# Patient Record
Sex: Female | Born: 1955 | Race: White | Hispanic: No | Smoking: Current every day smoker
Health system: Southern US, Community
[De-identification: ages and names within clinical notes are randomized; demographics above are authoritative.]

## PROBLEM LIST (undated history)

## (undated) DIAGNOSIS — K279 Peptic ulcer, site unspecified, unspecified as acute or chronic, without hemorrhage or perforation: Secondary | ICD-10-CM

## (undated) DIAGNOSIS — R21 Rash and other nonspecific skin eruption: Secondary | ICD-10-CM

## (undated) DIAGNOSIS — Z9889 Other specified postprocedural states: Secondary | ICD-10-CM

## (undated) DIAGNOSIS — M549 Dorsalgia, unspecified: Secondary | ICD-10-CM

## (undated) DIAGNOSIS — E039 Hypothyroidism, unspecified: Secondary | ICD-10-CM

## (undated) DIAGNOSIS — I2510 Atherosclerotic heart disease of native coronary artery without angina pectoris: Secondary | ICD-10-CM

## (undated) DIAGNOSIS — I5032 Chronic diastolic (congestive) heart failure: Secondary | ICD-10-CM

## (undated) DIAGNOSIS — Z720 Tobacco use: Secondary | ICD-10-CM

## (undated) DIAGNOSIS — R112 Nausea with vomiting, unspecified: Secondary | ICD-10-CM

## (undated) DIAGNOSIS — H9190 Unspecified hearing loss, unspecified ear: Secondary | ICD-10-CM

## (undated) DIAGNOSIS — E785 Hyperlipidemia, unspecified: Secondary | ICD-10-CM

## (undated) DIAGNOSIS — M1990 Unspecified osteoarthritis, unspecified site: Secondary | ICD-10-CM

## (undated) DIAGNOSIS — G8929 Other chronic pain: Secondary | ICD-10-CM

## (undated) DIAGNOSIS — F32A Depression, unspecified: Secondary | ICD-10-CM

## (undated) DIAGNOSIS — I350 Nonrheumatic aortic (valve) stenosis: Secondary | ICD-10-CM

## (undated) DIAGNOSIS — K2270 Barrett's esophagus without dysplasia: Secondary | ICD-10-CM

## (undated) DIAGNOSIS — R569 Unspecified convulsions: Secondary | ICD-10-CM

## (undated) DIAGNOSIS — K219 Gastro-esophageal reflux disease without esophagitis: Secondary | ICD-10-CM

## (undated) DIAGNOSIS — I119 Hypertensive heart disease without heart failure: Secondary | ICD-10-CM

## (undated) DIAGNOSIS — F329 Major depressive disorder, single episode, unspecified: Secondary | ICD-10-CM

## (undated) HISTORY — DX: Hypertensive heart disease without heart failure: I11.9

## (undated) HISTORY — DX: Tobacco use: Z72.0

## (undated) HISTORY — PX: TONSILLECTOMY AND ADENOIDECTOMY: SUR1326

## (undated) HISTORY — PX: CHOLECYSTECTOMY: SHX55

## (undated) HISTORY — DX: Gastro-esophageal reflux disease without esophagitis: K21.9

## (undated) HISTORY — DX: Unspecified convulsions: R56.9

## (undated) HISTORY — DX: Chronic diastolic (congestive) heart failure: I50.32

## (undated) HISTORY — DX: Nonrheumatic aortic (valve) stenosis: I35.0

## (undated) HISTORY — PX: CARDIAC CATHETERIZATION: SHX172

## (undated) HISTORY — DX: Depression, unspecified: F32.A

## (undated) HISTORY — DX: Major depressive disorder, single episode, unspecified: F32.9

## (undated) HISTORY — DX: Other chronic pain: G89.29

## (undated) HISTORY — DX: Peptic ulcer, site unspecified, unspecified as acute or chronic, without hemorrhage or perforation: K27.9

## (undated) HISTORY — DX: Atherosclerotic heart disease of native coronary artery without angina pectoris: I25.10

## (undated) HISTORY — DX: Unspecified hearing loss, unspecified ear: H91.90

## (undated) HISTORY — DX: Rash and other nonspecific skin eruption: R21

## (undated) HISTORY — DX: Barrett's esophagus without dysplasia: K22.70

## (undated) HISTORY — DX: Dorsalgia, unspecified: M54.9

## (undated) HISTORY — DX: Hyperlipidemia, unspecified: E78.5

## (undated) HISTORY — PX: VAGINAL HYSTERECTOMY: SUR661

---

## 2005-10-08 ENCOUNTER — Other Ambulatory Visit: Payer: Self-pay

## 2005-10-08 ENCOUNTER — Inpatient Hospital Stay: Payer: Self-pay | Admitting: Internal Medicine

## 2005-11-23 ENCOUNTER — Ambulatory Visit: Payer: Self-pay | Admitting: Internal Medicine

## 2006-03-28 ENCOUNTER — Emergency Department: Payer: Self-pay | Admitting: Emergency Medicine

## 2009-11-11 ENCOUNTER — Ambulatory Visit: Payer: Self-pay | Admitting: Otolaryngology

## 2009-11-30 ENCOUNTER — Ambulatory Visit: Payer: Self-pay | Admitting: Pain Medicine

## 2010-01-11 ENCOUNTER — Ambulatory Visit: Payer: Self-pay | Admitting: Pain Medicine

## 2010-02-15 ENCOUNTER — Ambulatory Visit: Payer: Self-pay | Admitting: Pain Medicine

## 2010-06-08 ENCOUNTER — Emergency Department (HOSPITAL_COMMUNITY): Admission: EM | Admit: 2010-06-08 | Discharge: 2010-06-09 | Payer: Self-pay | Admitting: Emergency Medicine

## 2010-07-14 ENCOUNTER — Ambulatory Visit: Payer: Self-pay | Admitting: Thoracic Surgery (Cardiothoracic Vascular Surgery)

## 2010-07-22 ENCOUNTER — Encounter: Payer: Self-pay | Admitting: Thoracic Surgery (Cardiothoracic Vascular Surgery)

## 2010-07-22 ENCOUNTER — Other Ambulatory Visit: Payer: Self-pay | Admitting: Thoracic Surgery (Cardiothoracic Vascular Surgery)

## 2010-07-22 ENCOUNTER — Ambulatory Visit: Payer: Self-pay | Admitting: Vascular Surgery

## 2010-07-26 ENCOUNTER — Ambulatory Visit (HOSPITAL_COMMUNITY)
Admission: RE | Admit: 2010-07-26 | Discharge: 2010-07-26 | Payer: Self-pay | Admitting: Thoracic Surgery (Cardiothoracic Vascular Surgery)

## 2010-12-15 LAB — COMPREHENSIVE METABOLIC PANEL
ALT: 17 U/L (ref 0–35)
AST: 26 U/L (ref 0–37)
Albumin: 4.2 g/dL (ref 3.5–5.2)
BUN: 14 mg/dL (ref 6–23)
CO2: 24 mEq/L (ref 19–32)
Calcium: 9.8 mg/dL (ref 8.4–10.5)
Chloride: 92 mEq/L — ABNORMAL LOW (ref 96–112)
Creatinine, Ser: 1.04 mg/dL (ref 0.4–1.2)
GFR calc Af Amer: >60 mL/min (ref >60)
GFR calc non Af Amer: 55 mL/min — ABNORMAL LOW (ref >60)
Glucose, Bld: 91 mg/dL (ref 70–99)
Potassium: 4.3 mEq/L (ref 3.5–5.1)
Sodium: 126 mEq/L — ABNORMAL LOW (ref 135–145)
Total Bilirubin: 0.4 mg/dL (ref 0.3–1.2)

## 2010-12-15 LAB — CBC
HCT: 46.4 % — ABNORMAL HIGH (ref 36.0–46.0)
Hemoglobin: 17.1 g/dL — ABNORMAL HIGH (ref 12.0–15.0)
MCH: 32.3 pg (ref 26.0–34.0)
MCHC: 36.9 g/dL — ABNORMAL HIGH (ref 30.0–36.0)
MCV: 87.5 fL (ref 78.0–100.0)
Platelets: 334 10*3/uL (ref 150–400)
RBC: 5.3 MIL/uL — ABNORMAL HIGH (ref 3.87–5.11)
WBC: 9.8 10*3/uL (ref 4.0–10.5)

## 2010-12-15 LAB — TYPE AND SCREEN
ABO/RH(D): O POS
Antibody Screen: NEGATIVE

## 2010-12-15 LAB — HEMOGLOBIN A1C
Hgb A1c MFr Bld: 5.2 % (ref <5.7)
Mean Plasma Glucose: 103 mg/dL (ref <117)

## 2010-12-15 LAB — PROTIME-INR
INR: 0.95 (ref 0.00–1.49)
Prothrombin Time: 12.9 seconds (ref 11.6–15.2)

## 2010-12-15 LAB — BASIC METABOLIC PANEL
BUN: 15 mg/dL (ref 6–23)
CO2: 23 mEq/L (ref 19–32)
Calcium: 9.1 mg/dL (ref 8.4–10.5)
Chloride: 97 mEq/L (ref 96–112)
Creatinine, Ser: 0.95 mg/dL (ref 0.4–1.2)
GFR calc Af Amer: >60 mL/min (ref >60)
GFR calc non Af Amer: >60 mL/min (ref >60)
Glucose, Bld: 101 mg/dL — ABNORMAL HIGH (ref 70–99)
Sodium: 127 mEq/L — ABNORMAL LOW (ref 135–145)

## 2010-12-15 LAB — SURGICAL PCR SCREEN
MRSA, PCR: NEGATIVE
Staphylococcus aureus: NEGATIVE

## 2010-12-15 LAB — APTT: aPTT: 30 seconds (ref 24–37)

## 2010-12-15 LAB — ABO/RH: ABO/RH(D): O POS

## 2010-12-16 LAB — CBC
HCT: 40.3 % (ref 36.0–46.0)
Hemoglobin: 14.7 g/dL (ref 12.0–15.0)
MCH: 31.7 pg (ref 26.0–34.0)
MCHC: 36.5 g/dL — ABNORMAL HIGH (ref 30.0–36.0)
MCV: 87 fL (ref 78.0–100.0)
Platelets: 255 10*3/uL (ref 150–400)
RBC: 4.63 MIL/uL (ref 3.87–5.11)
RDW: 13.1 % (ref 11.5–15.5)
WBC: 8.7 10*3/uL (ref 4.0–10.5)

## 2010-12-16 LAB — DIFFERENTIAL
Basophils Absolute: 0 10*3/uL (ref 0.0–0.1)
Basophils Relative: 0 % (ref 0–1)
Eosinophils Absolute: 0.1 10*3/uL (ref 0.0–0.7)
Eosinophils Relative: 1 % (ref 0–5)
Lymphocytes Relative: 34 % (ref 12–46)
Lymphs Abs: 2.9 10*3/uL (ref 0.7–4.0)
Monocytes Absolute: 0.6 10*3/uL (ref 0.1–1.0)
Neutro Abs: 5.1 10*3/uL (ref 1.7–7.7)
Neutrophils Relative %: 59 % (ref 43–77)

## 2010-12-16 LAB — URINE MICROSCOPIC-ADD ON

## 2010-12-16 LAB — URINALYSIS, ROUTINE W REFLEX MICROSCOPIC
Bilirubin Urine: NEGATIVE
Glucose, UA: NEGATIVE mg/dL
Ketones, ur: NEGATIVE mg/dL
Leukocytes, UA: NEGATIVE
Nitrite: NEGATIVE
Protein, ur: NEGATIVE mg/dL
Urobilinogen, UA: 0.2 mg/dL (ref 0.0–1.0)

## 2010-12-16 LAB — BASIC METABOLIC PANEL
BUN: 17 mg/dL (ref 6–23)
CO2: 23 mEq/L (ref 19–32)
Calcium: 8.5 mg/dL (ref 8.4–10.5)
Chloride: 92 mEq/L — ABNORMAL LOW (ref 96–112)
Creatinine, Ser: 1.03 mg/dL (ref 0.4–1.2)
GFR calc Af Amer: >60 mL/min (ref >60)
GFR calc non Af Amer: 56 mL/min — ABNORMAL LOW (ref >60)
Potassium: 3.4 mEq/L — ABNORMAL LOW (ref 3.5–5.1)
Sodium: 124 mEq/L — ABNORMAL LOW (ref 135–145)

## 2010-12-16 LAB — POCT CARDIAC MARKERS
CKMB, poc: 1.3 ng/mL (ref 1.0–8.0)
Myoglobin, poc: 78.9 ng/mL (ref 12–200)

## 2010-12-16 LAB — URINE CULTURE
Colony Count: NO GROWTH
Culture  Setup Time: 201109070016
Culture: NO GROWTH

## 2010-12-16 LAB — T4, FREE: Free T4: 1.33 ng/dL (ref 0.80–1.80)

## 2010-12-16 LAB — BRAIN NATRIURETIC PEPTIDE: Pro B Natriuretic peptide (BNP): 151 pg/mL — ABNORMAL HIGH (ref 0.0–100.0)

## 2010-12-16 LAB — TSH: TSH: 0.276 u[IU]/mL — ABNORMAL LOW (ref 0.350–4.500)

## 2010-12-16 LAB — D-DIMER, QUANTITATIVE: D-Dimer, Quant: <0.22 ug/mL-FEU (ref 0.00–0.48)

## 2011-02-15 NOTE — Consult Note (Signed)
NEW PATIENT CONSULTATION   [name], [name]  DOB:  01-04-1956                                        July 14, 2010  CHART #:  [id number]   HISTORY OF PRESENT ILLNESS:  The patient is a 55 year old woman with  known aortic stenosis who complains of feeling chest discomfort and  shortness of breath with exertion and also frequent feeling of a rapid  strong heartbeat over the past 8 or 9 months.  She states that she has  had a heart murmur her whole life.  About 5 years ago, she was told she  had aortic stenosis because her heart valve had not developed normally.  She states that over the past 6 months or so, she has been tiring very  easily.  She also occasionally will get shortness of breath.  She has  had some mild exertional chest tightness, but not what she would  describe as pain.  She also frequently gets episodes where she feels  like her heart is going to be out of her chest.  She feels very anxious,  her heart seems to be very powerfully and very uncomfortably and also at  times is very rapid.  She had had a followup echo back in May of this  year, which showed progression of her aortic stenosis with a peak  velocity of 3.9 m/sec consistent with a peak gradient of 57 mmHg and  estimated mean gradient of 36 mmHg.  At that time, she was seen by Dr.  Marrian Salvage at [facility] and referred to Dr. Remo Lipps.  Dr. Remo Lipps  evaluated her and recommended aortic valve replacement.  She was very  nervous and quite anxious at that time and would not commit to having  surgery.  She subsequently followed up with Dr. Lois Huxley and Dr. Joneen Roach, both of those appointments were in July of this year and both the  doctors emphasized the importance of aortic valve replacement.  She did  have a cardiac catheterization done that was back in April of this year,  which did show no hemodynamically significant coronary disease.  There  was some mild plaquing.  She states that  her symptoms have been fairly  stable since that time.  She has not had any presyncopal or syncopal  spells or any severe chest pain; no symptoms at rest, only with  exertion.  She does continue to have these frequent palpitations and now  feels as if she is ready to go ahead with aortic valve replacement.   PAST MEDICAL HISTORY:  Significant for aortic stenosis, mild non-  hemodynamically significant coronary artery disease, congestive heart  failure, dyslipidemia, hypothyroidism, Barrett esophagus,  gastroesophageal reflux, peptic ulcer disease, depression, and seizures  at age 31.   CURRENT MEDICATIONS:  1. Enteric-coated aspirin 81 mg daily.  2. Hydrochlorothiazide 12.5 mg daily.  3. Levothyroxine 75 mcg daily.  4. Simvastatin 80 mg daily.  5. Hydrocodone 10/500 one tablet 4 times daily as needed.   ALLERGIES:  She has no known drug allergies.   FAMILY HISTORY:  Significant for father having an MI.   SOCIAL HISTORY:  She is disabled due to her nerves and chronic back pain  and migraine headaches.  She does live at home.  She smokes one pack a  day and has for over 30  years and continues to smoke at this time.   REVIEW OF SYSTEMS:  Denies syncope, presyncope, paroxysmal nocturnal  dyspnea, orthopnea, peripheral edema, claudication, excessive bleeding,  or bruising.  She does have chronic back pain, occasional severe  headaches.  No fevers, chills, or sweats.  All other systems are  negative.   PHYSICAL EXAMINATION:  General:  The patient is a 55 year old woman, in  no acute distress.  Vital Signs:  Her blood pressure is 129/91, pulse  90, respirations are 18, and her oxygen saturation is 94% on room air.  Neurological:  She is alert and oriented, although very anxious.  There  is no focal deficits.  HEENT:  She is edentulous, otherwise  unremarkable.  Neck:  Bilateral transmitted murmurs.  Cardiac:  A 4/6  crescendo-decrescendo systolic murmur heard throughout the  precordium,  loudest at the right upper sternal border.  Lungs:  Clear with equal  breath sounds bilaterally.  Abdomen:  Soft and nontender.  Extremities:  Without clubbing, cyanosis, or edema.  She has faintly palpable radial,  dorsalis pedis, and posterior tibial pulses bilaterally.   LABORATORY DATA:  Echocardiogram from December 21, 2009, shows peak LV  outflow gradient of 3.9 mm/sec with an estimated mean gradient of 36  mmHg and estimated peak gradient of 57 mmHg.  The left ventricular  function was preserved.  There was left ventricular hypertrophy.  Ejection fraction was 55-60%.  There was some mitral annular  calcification, but no pathologic mitral regurg.  The remainder of the  study was unremarkable.  Cardiac catheterization which was performed on  Feb 01, 2010, showed 20-30% stenoses in the LAD.  The circumflex had  multiple plaques of less than 50% stenosis.  The right coronary was  small and nondominant.  There was no left main disease.  Her valve area  calculated 0.46 sq. cm with mean gradient of 63 mmHg.  Cardiac output  was 3.9 by Fick and 3.28 by thermodilution.   IMPRESSION:  The patient is a 55 year old woman with critical aortic  stenosis, which is symptomatic. I discussed the importance of aortic  valve replacement and discussed the natural history of critical aortic  stenosis once it becomes symptomatic.  I am in complete agreement with  Dr. Remo Lipps that aortic valve replacement as indicated.  We discussed  possible options including a sternotomy versus a more minimally invasive  left pericostal approach.  We also discussed valve options, which  include mechanical or tissue valves and discussed the relative  advantages and disadvantages of each particularly in regards to need for  lifelong anticoagulation.  I discussed in detail with her the expected  hospital stay and overall recovery.  I did discuss in detail the risks  which include, but not limited to death,  stroke, myocardial infarction,  deep venous thrombosis, pulmonary embolism, bleeding, possible need for  transfusions, infections as well as other organ system dysfunction  including respiratory, renal, or gastrointestinal complications.  She  understands and accepts these risks and does wish to proceed with  surgery at this time.  We have scheduled her for Monday, July 26, 2010.  She would be admitted the day of surgery.  She would need to come  in the week before for preoperative assessment by Anesthesia.  At the  present time, she is leaning towards a conventional operation and also  leaning towards a tissue valve to avoid the need for lifelong  anticoagulation.  She does understand that if this approach is used,  there is risk for intermediate or long-term valve failure and need for  re-replacement in the future.  She will continue to think over these  issues and will let us know if she has any change in her feelings  regarding those 2 issues.   Salvatore Decent Dorris Fetch, M.D.  Electronically Signed   SCH/MEDQ  D:  07/14/2010  T:  07/15/2010  Job:  [id number]   cc:   Dr. Lois Huxley  Dr. Joneen Roach  Dr. Pat Patrick

## 2011-02-15 NOTE — Assessment & Plan Note (Signed)
OFFICE VISIT   [name], [name]  DOB:  May 21, 1956                                        August 16, 2010  CHART #:  [id number]   The patient was scheduled for an aortic valve replacement tomorrow.  She  called in this morning and canceled that operation again.  I spoke to  her by telephone.  She says that now for the last 3-4 weeks she has been  having persistent nausea, vomiting, diarrhea, congestion, ear aches,  cough, and multiple other complaints.  Of note, she is not complaining  of chest pain and does not feel that she is in shape to have heart  surgery at this time.  I had a long discussion with her, once again  emphasized the very real importance of getting this aortic valve  replaced as soon as possible given the critical nature of aortic  stenosis.  I do think there is probably some anxiety component but I  certainly would like to see her nausea, vomiting, and diarrhea resolved  prior to having the surgery.  She has seen her doctor and was started on  some antibiotics about a week ago.  I advised her to keep on a very  regular basis so that we can get this resolved as soon as possible and  get her aortic valve replaced as soon as possible again because she does  have life-threatening aortic stenosis.   Salvatore Decent Dorris Fetch, M.D.  Electronically Signed   SCH/MEDQ  D:  08/16/2010  T:  08/17/2010  Job:  [id number]

## 2011-02-15 NOTE — Letter (Signed)
July 14, 2010   Carmin Richmond, MD  [facility]  [address], [place] [address]  Fax number [phone].   Re:  [name], [name]               DOB:  1956/06/15   Dear Dr. Earlene Plater:   I had the pleasure of seeing your patient in the office today.  As you  know, she is a 55 year old woman with critical aortic stenosis which is  symptomatic.  She had been evaluated in the spring at [facility] and  advised to have aortic valve replacement but was not ready to proceed  with surgery at that time for a variety of reasons.  I once again  emphasized to her the need for aortic valve replacement given the  severity of her aortic stenosis and her symptoms, and at this time she  does seem willing to proceed with surgery.  I did discuss with her  potential approaches including a conventional sternotomy versus a more  minimally invasive approach and she is undecided in regard to that  issue.  She also is undecided in terms of valve whether she would want  to have a mechanical valve to avoid the need for re-replacement, but  with the caveat of needing lifelong anticoagulation versus tissue valve  which would avoid the need for lifelong Coumadin but could result in  valve failure and need for re-replacement down the road.  We will  schedule her for surgery on Monday, July 26, 2010.  She will be  admitted on the day of surgery and we will touch base with her in the  meantime to clear up the final plans.  Again, thank you very much for  allowing [place] to see the patient and do not hesitate to contact me if you  have any questions regarding her care.   Salvatore Decent Dorris Fetch, M.D.  Electronically Signed   SCH/MEDQ  D:  07/14/2010  T:  07/15/2010  Job:  [id number]

## 2011-10-31 ENCOUNTER — Inpatient Hospital Stay: Payer: Self-pay | Admitting: Internal Medicine

## 2011-10-31 LAB — URINALYSIS, COMPLETE
Bilirubin,UR: NEGATIVE
Glucose,UR: NEGATIVE mg/dL (ref 0–75)
Hyaline Cast: 2
Ketone: NEGATIVE
Specific Gravity: 1.032 (ref 1.003–1.030)
Squamous Epithelial: 9
WBC UR: 15 /HPF (ref 0–5)

## 2011-10-31 LAB — COMPREHENSIVE METABOLIC PANEL
Alkaline Phosphatase: 92 U/L (ref 50–136)
Anion Gap: 10 (ref 7–16)
Calcium, Total: 8.5 mg/dL (ref 8.5–10.1)
Co2: 19 mmol/L — ABNORMAL LOW (ref 21–32)
EGFR (African American): >60
EGFR (Non-African Amer.): 56 — ABNORMAL LOW
Osmolality: 272 (ref 275–301)
Potassium: 3.8 mmol/L (ref 3.5–5.1)
SGPT (ALT): 12 U/L
Sodium: 136 mmol/L (ref 136–145)

## 2011-10-31 LAB — CBC WITH DIFFERENTIAL/PLATELET
Basophil %: 0.7 %
Eosinophil %: 0.4 %
HCT: 42 % (ref 35.0–47.0)
HGB: 14.2 g/dL (ref 12.0–16.0)
Lymphocyte #: 4.3 10*3/uL — ABNORMAL HIGH (ref 1.0–3.6)
Lymphocyte %: 48.5 %
Monocyte #: 0.5 10*3/uL (ref 0.0–0.7)
Monocyte %: 6.1 %
Neutrophil %: 44.3 %
RBC: 4.41 10*6/uL (ref 3.80–5.20)

## 2011-10-31 LAB — CK TOTAL AND CKMB (NOT AT ARMC)
CK, Total: 102 U/L (ref 21–215)
CK-MB: 4.7 ng/mL — ABNORMAL HIGH (ref 0.5–3.6)

## 2011-11-01 DIAGNOSIS — R079 Chest pain, unspecified: Secondary | ICD-10-CM

## 2011-11-01 DIAGNOSIS — I359 Nonrheumatic aortic valve disorder, unspecified: Secondary | ICD-10-CM

## 2011-11-01 DIAGNOSIS — R748 Abnormal levels of other serum enzymes: Secondary | ICD-10-CM

## 2011-11-01 LAB — TROPONIN I
Troponin-I: 0.07 ng/mL — ABNORMAL HIGH
Troponin-I: 0.06 ng/mL — ABNORMAL HIGH

## 2011-11-01 LAB — CK TOTAL AND CKMB (NOT AT ARMC)
CK, Total: 71 U/L (ref 21–215)
CK, Total: 68 U/L (ref 21–215)
CK-MB: 3.3 ng/mL (ref 0.5–3.6)

## 2011-11-01 LAB — LIPID PANEL
Cholesterol: 195 mg/dL (ref 0–200)
HDL Cholesterol: 30 mg/dL — ABNORMAL LOW (ref 40–60)
VLDL Cholesterol, Calc: 47 mg/dL — ABNORMAL HIGH (ref 5–40)

## 2011-11-18 ENCOUNTER — Encounter: Payer: Self-pay | Admitting: *Deleted

## 2011-11-18 ENCOUNTER — Ambulatory Visit (INDEPENDENT_AMBULATORY_CARE_PROVIDER_SITE_OTHER): Payer: Medicaid Other | Admitting: Cardiovascular Disease

## 2011-11-18 ENCOUNTER — Encounter: Payer: Self-pay | Admitting: Cardiovascular Disease

## 2011-11-18 DIAGNOSIS — R079 Chest pain, unspecified: Secondary | ICD-10-CM

## 2011-11-18 DIAGNOSIS — G8929 Other chronic pain: Secondary | ICD-10-CM | POA: Insufficient documentation

## 2011-11-18 DIAGNOSIS — M549 Dorsalgia, unspecified: Secondary | ICD-10-CM

## 2011-11-18 DIAGNOSIS — I350 Nonrheumatic aortic (valve) stenosis: Secondary | ICD-10-CM | POA: Insufficient documentation

## 2011-11-18 DIAGNOSIS — R0602 Shortness of breath: Secondary | ICD-10-CM | POA: Insufficient documentation

## 2011-11-18 DIAGNOSIS — I359 Nonrheumatic aortic valve disorder, unspecified: Secondary | ICD-10-CM

## 2011-11-18 NOTE — Assessment & Plan Note (Signed)
Mild shortness of breath at baseline, likely secondary to underlying aortic valve stenosis. Heart rate is relatively well controlled.

## 2011-11-18 NOTE — Progress Notes (Signed)
Patient ID: [name], female    DOB: 01/28/1956, 56 y.o.   MRN: [id number]  HPI Comments: [name] is a 56 year old woman with a history of severe aortic valve stenosis, long smoking history, mild coronary artery disease by cardiac catheterization at [facility] several years ago, chronic back pain who is on pain medications, somewhat deaf who presented to [facility] at the end of January 2013 with worsening chest pain.   Notes indicate she had a recent course of antibiotics for sinus congestion, she was scheduled to have tubes put in her ears by ENT and [place] but this did not happen secondary to her severe aortic stenosis and their risks and complications. She continues to take care of her mother with dementia and a disabled son. This was the reason she did not follow back up with Dr. Orson Aloe for surgery in 2011.  Echocardiogram was done in the hospital that showed estimated aortic valve area 0.7 cm, mean gradient 56 mmHg, peak gradient 97 mmHg, Peak velocity 487 cm/sec, mild LVH, right ventricular systolic pressure 30-40 mm of mercury, mildly dilated left atrium, mild MR. Left ventricle is nondilated.  In 2011, AV mean gradient was 36, peak gradient 57  Today she reports that she is doing well with no further chest pain. Mild shortness of breath at baseline.  Cardiac catheterization April 2000 and showing no hemodynamically significant disease, mild plaque She continues to have problems finding coverage for her mother who has dementia. 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 The patient reports that she has difficulty sleeping and has been taking numerous sleep pain pills  EKG from the hospital shows normal sinus rhythm with rate 76 beats per minute with ST abnormality in V5, V6, one and aVL, 2   Outpatient Encounter Prescriptions as of 11/18/2011  Medication Sig Dispense Refill  . albuterol-ipratropium (COMBIVENT) 18-103 MCG/ACT inhaler Inhale 2 puffs into the lungs every 6  (six) hours as needed.      Marland Kitchen aspirin 81 MG tablet Take 81 mg by mouth daily.      Marland Kitchen levothyroxine (SYNTHROID, LEVOTHROID) 50 MCG tablet Take 50 mcg by mouth daily.      Marland Kitchen omeprazole (PRILOSEC) 20 MG capsule Take 20 mg by mouth daily.      Marland Kitchen tiotropium (SPIRIVA) 18 MCG inhalation capsule Place 18 mcg into inhaler and inhale daily.      Marland Kitchen DISCONTD: ciprofloxacin (CIPRO) 500 MG tablet Take 500 mg by mouth 2 (two) times daily.      Marland Kitchen DISCONTD: oxycodone-acetaminophen (NARVOX) 10-500 MG per tablet Take 1 tablet by mouth every 6 (six) hours as needed.         Review of Systems  Constitutional: Negative.   HENT: Negative.   Eyes: Negative.   Respiratory: Positive for shortness of breath.   Cardiovascular: Positive for chest pain.  Gastrointestinal: Negative.   Musculoskeletal: Negative.   Skin: Negative.   Neurological: Negative.   Hematological: Negative.   Psychiatric/Behavioral: Negative.   All other systems reviewed and are negative.    BP 165/95  Pulse 112  Ht 5\' 1"  (1.549 m)  Wt 133 lb (60.328 kg)  BMI 25.13 kg/m2  Physical Exam  Nursing note and vitals reviewed. Constitutional: She is oriented to person, place, and time. She appears well-developed and well-nourished.  HENT:  Head: Normocephalic.  Nose: Nose normal.  Mouth/Throat: Oropharynx is clear and moist.  Eyes: Conjunctivae are normal. Pupils are equal, round, and reactive to light.  Neck: Normal  range of motion. Neck supple. No JVD present.  Cardiovascular: Normal rate, regular rhythm, S1 normal, S2 normal and intact distal pulses.  Exam reveals no gallop and no friction rub.   Murmur heard.  Crescendo systolic murmur is present with a grade of 3/6  Pulmonary/Chest: Effort normal and breath sounds normal. No respiratory distress. She has no wheezes. She has no rales. She exhibits no tenderness.  Abdominal: Soft. Bowel sounds are normal. She exhibits no distension. There is no tenderness.  Musculoskeletal: Normal  range of motion. She exhibits no edema and no tenderness.  Lymphadenopathy:    She has no cervical adenopathy.  Neurological: She is alert and oriented to person, place, and time. Coordination normal.  Skin: Skin is warm and dry. No rash noted. No erythema.  Psychiatric: She has a normal mood and affect. Her behavior is normal. Judgment and thought content normal.         Assessment and Plan

## 2011-11-18 NOTE — Assessment & Plan Note (Signed)
Recent episode of chest pain felt to be not secondary to underlying coronary artery disease given cardiac catheterization in 2011 showing no significant stenoses. Symptoms likely secondary to aortic valve stenosis.

## 2011-11-18 NOTE — Patient Instructions (Signed)
We will call Dr. Dorris Fetch for an appointment. We will contact you with that appointment.

## 2011-11-18 NOTE — Assessment & Plan Note (Signed)
Severe AS. Recent Echocardiogram showing progression of her aortic stenosis, now severe to critical.  Recent episode of chest pain with admission to the hospital.  We will refer her back to cardiac surgery at her request.  She continues to have underlying social issues as she does not have a caretaker for her mother who has dementia.

## 2011-11-18 NOTE — Assessment & Plan Note (Signed)
Severe back pain, hip pain. She reports not having a physician to provide her with pain pills strong enough for her symptoms. She has seen pain clinic in the past. We have suggested she have her aortic valve surgery completed so that if surgery is needed on her back, this could be performed.

## 2011-11-29 ENCOUNTER — Encounter: Payer: Self-pay | Admitting: Thoracic Surgery (Cardiothoracic Vascular Surgery)

## 2011-11-29 ENCOUNTER — Institutional Professional Consult (permissible substitution) (INDEPENDENT_AMBULATORY_CARE_PROVIDER_SITE_OTHER): Payer: Medicaid Other | Admitting: Thoracic Surgery (Cardiothoracic Vascular Surgery)

## 2011-11-29 VITALS — BP 134/97 | HR 105 | Resp 20 | Ht 61.0 in | Wt 135.0 lb

## 2011-11-29 DIAGNOSIS — I359 Nonrheumatic aortic valve disorder, unspecified: Secondary | ICD-10-CM

## 2011-11-29 DIAGNOSIS — I350 Nonrheumatic aortic (valve) stenosis: Secondary | ICD-10-CM

## 2011-11-29 NOTE — Progress Notes (Signed)
PCP is Carmin Richmond, MD, MD Referring Provider is Julien Nordmann, MD  Chief Complaint  Patient presents with  . Follow-up    aortic stenosis...re-evaluate for surgery.Marland KitchenMarland KitchenREFERRED BY DR. Fayrene Fearing DAVIS OF [facility].  SHE HAS ALSO SEEN DR. Julien Nordmann .    HPI: 56 year old woman with known aortic stenosis, who presents with a chief complaint of shortness of breath and worsening chest pain. Mrs. Valido is sent known aortic stenosis which has been severe in symptomatic since 2011. She initially had had been evaluated for surgery in [facility] by Dr. Marrian Salvage and Dr. Remo Lipps. She has severe aortic stenosis with a mean gradient of 36 mmHg and a peak gradient of 57 mm mercury. Aortic valve replacement was recommended but she would not commit to having surgery. About 6 months later in October I saw her for the same problem and once again emphasized the importance of aortic valve replacement for critical aortic stenosis. I was able to talk her into surgery. However on multiple occasions she was scheduled and call them shortly before the procedure to cancel the procedure. The final contact was in November of 2011 after canceling surgery due to gastrointestinal symptoms I asked her to call the office when she felt she was ready for surgery but had not heard back from her since then.  At the end of January of this year she presented with worsening chest pain and shortness of breath [facility]. Echocardiogram showed a mean gradient of 56 mmHg and a peak gradient of 97 mmHg and a valve area calculated at 0.7 cm2. She saw Dr. Julien Nordmann in followup and was referred back to [place]. She states that she's not had any more chest pain since the admission. She does have shortness of breath with even minimal exertion. She states that she's had a tremor since the admission that has not resolved. He continues to have a great deal of family stress, homeschooling her daughters children and caring for her mother  with Alzheimer's.    Past Medical History  Diagnosis Date  . Smoking history   . Aortic valve stenosis   . Back pain, chronic   . Chest pain   . SOB (shortness of breath)   . Coronary artery disease     mild; non-hemodynamically significant  . CHF (congestive heart failure)   . Dyslipidemia   . Thyroid disease     hypothyroidism  . Barrett esophagus   . GERD (gastroesophageal reflux disease)     with PUD  . PUD (peptic ulcer disease)   . Depression   . Seizures     as a child at age 40  . Deaf   . Sinus congestion   . Wheezing     Past Surgical History  Procedure Date  . Cardiac catheterization 01/2010    Family History  Problem Relation Age of Onset  . Heart attack Father   . Heart disease Neg Hx     heart valve problems in other family members    Social History History  Substance Use Topics  . Smoking status: Current Everyday Smoker -- 1.0 packs/day for 30 years    Types: Cigarettes  . Smokeless tobacco: Not on file  . Alcohol Use: No    Current Outpatient Prescriptions  Medication Sig Dispense Refill  . aspirin 81 MG tablet Take 325 mg by mouth daily.       Marland Kitchen levothyroxine (SYNTHROID, LEVOTHROID) 50 MCG tablet Take 50 mcg by mouth daily.      Marland Kitchen  omeprazole (PRILOSEC) 20 MG capsule Take 20 mg by mouth daily.      Marland Kitchen albuterol-ipratropium (COMBIVENT) 18-103 MCG/ACT inhaler Inhale 2 puffs into the lungs every 6 (six) hours as needed.      . tiotropium (SPIRIVA) 18 MCG inhalation capsule Place 18 mcg into inhaler and inhale daily.        No Known Allergies  Review of Systems  Constitutional: Positive for activity change and fatigue. Negative for fever, chills and appetite change.  HENT: Positive for hearing loss, ear pain and congestion.        Ear "blockage" Hard of hearing  Eyes: Negative.   Respiratory: Positive for cough, shortness of breath and wheezing.   Cardiovascular: Positive for chest pain.       Heart murmur orthopnea  Gastrointestinal:        GI bleed 6 months ago Reflux  Genitourinary: Negative.   Musculoskeletal: Positive for back pain.       Hip pain R > L  Neurological: Positive for tremors and light-headedness. Negative for dizziness and syncope.  Psychiatric/Behavioral: Positive for dysphoric mood.       Nervous  All other systems reviewed and are negative.    BP 134/97  Pulse 105  Resp 20  Ht 5\' 1"  (1.549 m)  Wt 135 lb (61.236 kg)  BMI 25.51 kg/m2  SpO2 97% Physical Exam  Vitals reviewed. Constitutional: She is oriented to person, place, and time. She appears well-developed and well-nourished.  HENT:       edentulous  Eyes: EOM are normal. Pupils are equal, round, and reactive to light.  Neck: Neck supple. No JVD present. No thyromegaly present.  Cardiovascular: Normal rate and regular rhythm.   Murmur (3/6 crescendo/ decrescendo) heard.      Unable to palpate DP/PT  Pulmonary/Chest: Effort normal and breath sounds normal.  Abdominal: Soft. There is no tenderness.  Musculoskeletal: She exhibits no edema.  Lymphadenopathy:    She has no cervical adenopathy.  Neurological: She is alert and oriented to person, place, and time.  Skin: Skin is warm and dry.  Psychiatric:       Anxious, unable to stay focused     Diagnostic Tests: ECHO report and images reviewed  Impression: 56 year old woman with critical aortic stenosis which is symptomatic. She needs an aortic valve replacement. Given her recent GI bleed as well as concerned about reliability, I do not think she is a candidate for a mechanical valve. It would definitely benefit from a pericardial valve. She is aware that there is risk of failure with pericardial valve, but generally speaking they have good longevity.  I had a long discussion with her regarding the natural history of aortic stenosis, the need for aortic valve replacement, the nature of the surgery, need for general anesthesia, incision to be used, expected hospital stay and expected  overall recovery. We also discussed typical short and long-term outcomes This was a difficult conversation as she had trouble staying focused on the topic, and often redirected the conversation to other issues not germane to the discussion. Fortunately her daughter was present to assist with the patient's understanding of the issues involved. I was quite clear netting to her that her life expectancy was very limited she underwent aortic valve replacement, and that while there were no guarantees her odds with aortic valve replacement were excellent.  I did discuss with her the indications, risks, benefits, and alternatives. She They understand the risks include but are not limited to death, stroke,  MI, DVT/PE, bleeding, possible need for transfusion, infections, other organ system dysfunction including respiratory, renal, or GI complications. They understands and accepts these risks and says she will proceed.  Prior to aortic valve replacement she needs a repeat cardiac catheterization. His been about 2 years and she was cathed. She did have some moderate coronary disease on her previous catheterization. It was not hemodynamically significant at that time but could have progressed in the interim. I attempted to call Dr. Mariah Milling. He is not in the office today but I left a message for him to call me tomorrow regarding this issue.  Plan: Aortic valve replacement with pericardial valve after she has had preoperative catheterization.

## 2011-12-01 ENCOUNTER — Encounter: Payer: Self-pay | Admitting: *Deleted

## 2011-12-01 ENCOUNTER — Encounter (HOSPITAL_COMMUNITY): Payer: Self-pay | Admitting: Pharmacy Technician

## 2011-12-01 ENCOUNTER — Telehealth: Payer: Self-pay | Admitting: *Deleted

## 2011-12-01 ENCOUNTER — Other Ambulatory Visit: Payer: Self-pay | Admitting: *Deleted

## 2011-12-01 DIAGNOSIS — I359 Nonrheumatic aortic valve disorder, unspecified: Secondary | ICD-10-CM

## 2011-12-01 NOTE — Telephone Encounter (Signed)
pt aware of being scheduled for cath 12/09/11 @ 10:30 with Dr. Mariah Milling @ [facility], went over cath instructions and mailed cath instructions. Danielle Rankin

## 2011-12-06 ENCOUNTER — Other Ambulatory Visit: Payer: Medicaid Other

## 2011-12-06 DIAGNOSIS — I359 Nonrheumatic aortic valve disorder, unspecified: Secondary | ICD-10-CM

## 2011-12-07 LAB — CBC WITH DIFFERENTIAL/PLATELET
Basos: 1 % (ref 0–3)
Eos: 3 % (ref 0–7)
Eosinophils Absolute: 0.1 10*3/uL (ref 0.0–0.4)
HCT: 39.6 % (ref 34.0–46.6)
Hemoglobin: 13.8 g/dL (ref 11.1–15.9)
Immature Grans (Abs): 0 10*3/uL (ref 0.0–0.1)
Lymphocytes Absolute: 1.5 10*3/uL (ref 0.7–4.5)
MCH: 32.2 pg (ref 26.6–33.0)
MCV: 92 fL (ref 79–97)
Monocytes Absolute: 0.5 10*3/uL (ref 0.1–1.0)
Neutrophils Absolute: 3.1 10*3/uL (ref 1.8–7.8)
RBC: 4.29 x10E6/uL (ref 3.77–5.28)
WBC: 5.4 10*3/uL (ref 4.0–10.5)

## 2011-12-07 LAB — BASIC METABOLIC PANEL
BUN/Creatinine Ratio: 14 (ref 9–23)
Calcium: 8.7 mg/dL (ref 8.7–10.2)
GFR calc non Af Amer: 59 mL/min/{1.73_m2} — ABNORMAL LOW (ref >59)
Potassium: 4.2 mmol/L (ref 3.5–5.2)
Sodium: 135 mmol/L (ref 134–144)

## 2011-12-08 ENCOUNTER — Other Ambulatory Visit: Payer: Self-pay

## 2011-12-08 DIAGNOSIS — I350 Nonrheumatic aortic (valve) stenosis: Secondary | ICD-10-CM

## 2011-12-08 DIAGNOSIS — R0602 Shortness of breath: Secondary | ICD-10-CM

## 2011-12-09 ENCOUNTER — Ambulatory Visit (HOSPITAL_COMMUNITY): Admit: 2011-12-09 | Payer: Medicaid Other | Admitting: Cardiovascular Disease

## 2011-12-09 ENCOUNTER — Inpatient Hospital Stay (HOSPITAL_BASED_OUTPATIENT_CLINIC_OR_DEPARTMENT_OTHER): Admission: RE | Admit: 2011-12-09 | Payer: Medicaid Other | Source: Ambulatory Visit | Admitting: Cardiovascular Disease

## 2011-12-09 ENCOUNTER — Encounter (HOSPITAL_COMMUNITY): Payer: Self-pay

## 2011-12-09 ENCOUNTER — Ambulatory Visit: Payer: Self-pay | Admitting: Cardiovascular Disease

## 2011-12-09 ENCOUNTER — Encounter (HOSPITAL_BASED_OUTPATIENT_CLINIC_OR_DEPARTMENT_OTHER): Admission: RE | Payer: Self-pay | Source: Ambulatory Visit

## 2011-12-09 DIAGNOSIS — R079 Chest pain, unspecified: Secondary | ICD-10-CM

## 2011-12-09 SURGERY — JV LEFT HEART CATHETERIZATION WITH CORONARY ANGIOGRAM
Anesthesia: Moderate Sedation

## 2011-12-09 SURGERY — LEFT HEART CATHETERIZATION WITH CORONARY ANGIOGRAM
Anesthesia: LOCAL

## 2011-12-17 ENCOUNTER — Telehealth: Payer: Self-pay | Admitting: Thoracic Surgery (Cardiothoracic Vascular Surgery)

## 2011-12-17 NOTE — Telephone Encounter (Signed)
Inadvertent opening of encounter

## 2011-12-19 ENCOUNTER — Encounter: Payer: Self-pay | Admitting: Cardiovascular Disease

## 2011-12-28 ENCOUNTER — Encounter: Payer: Medicaid Other | Admitting: Nurse Practitioner

## 2011-12-28 ENCOUNTER — Encounter: Payer: Medicaid Other | Admitting: Cardiovascular Disease

## 2011-12-30 ENCOUNTER — Encounter: Payer: Self-pay | Admitting: Cardiovascular Disease

## 2012-01-03 ENCOUNTER — Encounter: Payer: Self-pay | Admitting: *Deleted

## 2012-01-05 ENCOUNTER — Encounter: Payer: Self-pay | Admitting: Cardiovascular Disease

## 2012-01-05 ENCOUNTER — Ambulatory Visit (INDEPENDENT_AMBULATORY_CARE_PROVIDER_SITE_OTHER): Payer: Medicaid Other | Admitting: Cardiovascular Disease

## 2012-01-05 VITALS — BP 160/90 | Ht 60.0 in | Wt 130.4 lb

## 2012-01-05 DIAGNOSIS — R0602 Shortness of breath: Secondary | ICD-10-CM

## 2012-01-05 DIAGNOSIS — I350 Nonrheumatic aortic (valve) stenosis: Secondary | ICD-10-CM

## 2012-01-05 DIAGNOSIS — R079 Chest pain, unspecified: Secondary | ICD-10-CM

## 2012-01-05 DIAGNOSIS — R000 Tachycardia, unspecified: Secondary | ICD-10-CM

## 2012-01-05 DIAGNOSIS — I359 Nonrheumatic aortic valve disorder, unspecified: Secondary | ICD-10-CM

## 2012-01-05 MED ORDER — METOPROLOL TARTRATE 50 MG PO TABS: 50.0000 mg | ORAL_TABLET | Freq: Two times a day (BID) | ORAL | Status: DC

## 2012-01-05 NOTE — Assessment & Plan Note (Signed)
She is tachycardic with high blood pressure today. We will start metoprolol tartrate 50 mg twice a day.

## 2012-01-05 NOTE — Assessment & Plan Note (Signed)
We will call the office of Dr. Dorris Fetch to set her up with a followup for aortic valve replacement surgery. Recent cardiac catheterization showing nonocclusive disease.

## 2012-01-05 NOTE — Assessment & Plan Note (Signed)
Moderate shortness of breath with exertion likely secondary to underlying aortic valve stenosis. Will try to encourage her to follow through with AVR.

## 2012-01-05 NOTE — Patient Instructions (Addendum)
You are doing well. Please start metoprolol one in the Am and the PM, to slow the heart rate and for blood pressure  We will call [facility] for an appt for valve surgery  Please call us if you have new issues that need to be addressed before your next appt.  Your physician wants you to follow-up in: 6 months.  You will receive a reminder letter in the mail two months in advance. If you don't receive a letter, please call our office to schedule the follow-up appointment.

## 2012-01-05 NOTE — Assessment & Plan Note (Signed)
Minimal chest pain recently. Recent hospitalization 11/01/2011 for chest pain. Etiology likely secondary to underlying aortic valve stenosis.

## 2012-01-05 NOTE — Progress Notes (Signed)
Patient ID: [name], female    DOB: 1956/07/02, 56 y.o.   MRN: [id number]  HPI Comments: [name] is a 56 year old woman with a history of severe aortic valve stenosis, long smoking history, mild coronary artery disease by cardiac catheterization at [facility] several years ago, chronic back pain who is on pain medications, somewhat deaf who presented to [facility] at the end of January 2013 with worsening chest pain.   Echocardiogram was done in the hospital that showed estimated aortic valve area 0.7 cm, mean gradient 56 mmHg, peak gradient 97 mmHg, Peak velocity 487 cm/sec, mild LVH, right ventricular systolic pressure 30-40 mm of mercury, mildly dilated left atrium, mild MR. Left ventricle is nondilated. In 2011, AV mean gradient was 36, peak gradient 57  Recent cardiac catheterization last month showed mild to moderate nonobstructive disease, distal aortic disease estimated at moderate.  2 days ago, she developed blistering in her palms bilaterally that extended throughout the whole hand with swelling, now with rash on her chest. She was seen by urgent care and given antibiotic cream with some improvement of her erythema and blistering. She reports it is improving over the past day with use of the cream. She denies any new medications or food allergies.  EKG from the hospital shows normal sinus rhythm with rate 106 beats per minute with ST abnormality in V4 to V6, one and aVL, II, III, AVF   Outpatient Encounter Prescriptions as of 01/05/2012  Medication Sig Dispense Refill  . aspirin 325 MG tablet Take 325 mg by mouth daily.      Marland Kitchen levothyroxine (SYNTHROID, LEVOTHROID) 50 MCG tablet Take 50 mcg by mouth daily.      Marland Kitchen omeprazole (PRILOSEC) 20 MG capsule Take 20 mg by mouth daily.        Review of Systems  Constitutional: Negative.   HENT: Negative.   Eyes: Negative.   Respiratory: Positive for shortness of breath.   Cardiovascular: Positive for palpitations.  Gastrointestinal: Negative.    Musculoskeletal: Negative.   Skin: Negative.   Neurological: Negative.   Hematological: Negative.   Psychiatric/Behavioral: Negative.   All other systems reviewed and are negative.    BP 160/90  Ht 5' (1.524 m)  Wt 130 lb 6.4 oz (59.149 kg)  BMI 25.47 kg/m2  Physical Exam  Nursing note and vitals reviewed. Constitutional: She is oriented to person, place, and time. She appears well-developed and well-nourished.  HENT:  Head: Normocephalic.  Nose: Nose normal.  Mouth/Throat: Oropharynx is clear and moist.  Eyes: Conjunctivae are normal. Pupils are equal, round, and reactive to light.  Neck: Normal range of motion. Neck supple. No JVD present.  Cardiovascular: Normal rate, regular rhythm, S1 normal, S2 normal and intact distal pulses.  Exam reveals no gallop and no friction rub.   Murmur heard.  Crescendo systolic murmur is present with a grade of 3/6  Pulmonary/Chest: Effort normal and breath sounds normal. No respiratory distress. She has no wheezes. She has no rales. She exhibits no tenderness.  Abdominal: Soft. Bowel sounds are normal. She exhibits no distension. There is no tenderness.  Musculoskeletal: Normal range of motion. She exhibits no edema and no tenderness.  Lymphadenopathy:    She has no cervical adenopathy.  Neurological: She is alert and oriented to person, place, and time. Coordination normal.  Skin: Skin is warm and dry. No rash noted. No erythema.  Psychiatric: She has a normal mood and affect. Her behavior is normal. Judgment and thought content normal.  Assessment and Plan

## 2012-01-18 ENCOUNTER — Encounter: Payer: Medicaid Other | Admitting: Thoracic Surgery (Cardiothoracic Vascular Surgery)

## 2012-01-19 ENCOUNTER — Encounter: Payer: Medicaid Other | Admitting: Thoracic Surgery (Cardiothoracic Vascular Surgery)

## 2012-01-25 ENCOUNTER — Encounter: Payer: Medicaid Other | Admitting: Thoracic Surgery (Cardiothoracic Vascular Surgery)

## 2012-02-08 ENCOUNTER — Encounter: Payer: Medicaid Other | Admitting: Thoracic Surgery (Cardiothoracic Vascular Surgery)

## 2012-02-14 ENCOUNTER — Encounter: Payer: Self-pay | Admitting: Thoracic Surgery (Cardiothoracic Vascular Surgery)

## 2012-02-14 ENCOUNTER — Other Ambulatory Visit: Payer: Self-pay | Admitting: Thoracic Surgery (Cardiothoracic Vascular Surgery)

## 2012-02-14 ENCOUNTER — Ambulatory Visit (INDEPENDENT_AMBULATORY_CARE_PROVIDER_SITE_OTHER): Payer: Medicaid Other | Admitting: Thoracic Surgery (Cardiothoracic Vascular Surgery)

## 2012-02-14 ENCOUNTER — Encounter (HOSPITAL_COMMUNITY): Payer: Self-pay | Admitting: Pharmacy Technician

## 2012-02-14 ENCOUNTER — Other Ambulatory Visit: Payer: Self-pay

## 2012-02-14 VITALS — BP 165/90 | HR 84 | Resp 24 | Ht 60.0 in | Wt 130.0 lb

## 2012-02-14 DIAGNOSIS — I350 Nonrheumatic aortic (valve) stenosis: Secondary | ICD-10-CM

## 2012-02-14 DIAGNOSIS — I359 Nonrheumatic aortic valve disorder, unspecified: Secondary | ICD-10-CM

## 2012-02-14 NOTE — Progress Notes (Signed)
HPI: [name] returns today to further discuss her aortic stenosis. In the interim since her last visit she's undergone cardiac catheterization. It showed no evidence of clinically significant coronary stenosis. She states that also in the interim since her last visit she developed a severe rash on the palms of her hands. She was treated with an antibiotic cream initially analysis putting lotion on them. She complains of difficulty making a fist of the right hand because of swelling in the hand. This did not affect the dorsum of her hand or arms or the soles of her feet. She continues to have shortness of breath. She is very anxious about her aortic stenosis and her surgery. She was started on a "nerve pill" by Dr. Earlene Plater. She says this is not working particularly well. She says she was told that once a day but has been taking it 4 times a day. She is unable to tell me the name of the medication.  Past Medical History  Diagnosis Date  . Smoking history   . Aortic valve stenosis   . Back pain, chronic   . Chest pain   . SOB (shortness of breath)   . Coronary artery disease     mild; non-hemodynamically significant  . CHF (congestive heart failure)   . Dyslipidemia   . Thyroid disease     hypothyroidism  . Barrett esophagus   . GERD (gastroesophageal reflux disease)     with PUD  . PUD (peptic ulcer disease)   . Depression   . Seizures     as a child at age 12  . Deaf   . Sinus congestion   . Wheezing   . Rash, skin     2 weeks ago, bilateral hand rash, peeling, eruptions      Current Outpatient Prescriptions  Medication Sig Dispense Refill  . aspirin 325 MG tablet Take 325 mg by mouth daily.      Marland Kitchen levothyroxine (SYNTHROID, LEVOTHROID) 50 MCG tablet Take 50 mcg by mouth daily.      . metoprolol tartrate (LOPRESSOR) 50 MG tablet Take 1 tablet (50 mg total) by mouth 2 (two) times daily.  60 tablet  11  . omeprazole (PRILOSEC) 20 MG capsule Take 20 mg by mouth daily.         Physical  Exam: BP 165/90  Pulse 84  Resp 24  Ht 5' (1.524 m)  Wt 130 lb (58.968 kg)  BMI 25.39 kg/m2  SpO72 2% 56 year old woman appearing much older than her stated age.  Gen. 56 year old woman in no acute distress, very anxious Cardiac regular, high-pitched systolic murmur heard throughout the precordium Lungs clear Mild erythema and induration of both hands  Diagnostic Tests: Cardiac catheterization report reviewed, key finding no significant coronary disease  Impression: 56 year old woman with severe aortic stenosis which is symptomatic. I once again reviewed with her the necessity for aortic valve replacement given the natural history of aortic stenosis she likely has less than a year to live without aortic valve replacement. I discussed with her the general nature of the operation, need for general anesthesia, and expected hospital stay and overall recovery. Indications, risks, benefits, and alternatives. She understands the risk include but are not limited to death, stroke, bleeding, possible need for transfusion, infection, complete heart block requiring permanent pacemaker placement, and other organ system dysfunction including respiratory, renal, or GI complications. I do not think she is a candidate for lifelong anticoagulation with Coumadin. Therefore recommended to her that we proceed with  a tissue valve  Plan: Aortic valve replacement with a tissue valve on Monday, May 20

## 2012-02-16 ENCOUNTER — Encounter (HOSPITAL_COMMUNITY)
Admission: RE | Admit: 2012-02-16 | Discharge: 2012-02-16 | Disposition: A | Discharge status: Home / Self Care | Payer: Medicaid Other | Source: Ambulatory Visit | Attending: Thoracic Surgery (Cardiothoracic Vascular Surgery) | Admitting: Thoracic Surgery (Cardiothoracic Vascular Surgery)

## 2012-02-16 ENCOUNTER — Encounter (HOSPITAL_COMMUNITY): Payer: Self-pay

## 2012-02-16 ENCOUNTER — Inpatient Hospital Stay (HOSPITAL_COMMUNITY)
Admission: RE | Admit: 2012-02-16 | Discharge: 2012-02-16 | Disposition: A | Discharge status: Home / Self Care | Payer: Medicaid Other | Source: Ambulatory Visit | Attending: Thoracic Surgery (Cardiothoracic Vascular Surgery) | Admitting: Thoracic Surgery (Cardiothoracic Vascular Surgery)

## 2012-02-16 ENCOUNTER — Other Ambulatory Visit (HOSPITAL_COMMUNITY): Payer: Self-pay | Admitting: Radiology

## 2012-02-16 ENCOUNTER — Ambulatory Visit (HOSPITAL_COMMUNITY)
Admission: RE | Admit: 2012-02-16 | Discharge: 2012-02-16 | Disposition: A | Discharge status: Home / Self Care | Payer: Medicaid Other | Source: Ambulatory Visit | Attending: Thoracic Surgery (Cardiothoracic Vascular Surgery) | Admitting: Thoracic Surgery (Cardiothoracic Vascular Surgery)

## 2012-02-16 VITALS — BP 136/84 | HR 85 | Temp 97.0°F | Resp 20 | Ht 60.0 in | Wt 129.9 lb

## 2012-02-16 DIAGNOSIS — Z01811 Encounter for preprocedural respiratory examination: Secondary | ICD-10-CM | POA: Insufficient documentation

## 2012-02-16 DIAGNOSIS — Z01810 Encounter for preprocedural cardiovascular examination: Secondary | ICD-10-CM | POA: Insufficient documentation

## 2012-02-16 DIAGNOSIS — I359 Nonrheumatic aortic valve disorder, unspecified: Secondary | ICD-10-CM

## 2012-02-16 DIAGNOSIS — I6529 Occlusion and stenosis of unspecified carotid artery: Secondary | ICD-10-CM | POA: Insufficient documentation

## 2012-02-16 DIAGNOSIS — Z01812 Encounter for preprocedural laboratory examination: Secondary | ICD-10-CM | POA: Insufficient documentation

## 2012-02-16 DIAGNOSIS — I658 Occlusion and stenosis of other precerebral arteries: Secondary | ICD-10-CM | POA: Insufficient documentation

## 2012-02-16 HISTORY — DX: Other specified postprocedural states: R11.2

## 2012-02-16 HISTORY — DX: Unspecified osteoarthritis, unspecified site: M19.90

## 2012-02-16 HISTORY — DX: Other specified postprocedural states: Z98.890

## 2012-02-16 HISTORY — DX: Hypothyroidism, unspecified: E03.9

## 2012-02-16 LAB — PULMONARY FUNCTION TEST

## 2012-02-16 LAB — SURGICAL PCR SCREEN: MRSA, PCR: NEGATIVE

## 2012-02-16 LAB — COMPREHENSIVE METABOLIC PANEL
AST: 22 U/L (ref 0–37)
Albumin: 3.7 g/dL (ref 3.5–5.2)
Alkaline Phosphatase: 98 U/L (ref 39–117)
Chloride: 104 mEq/L (ref 96–112)
Potassium: 3.9 mEq/L (ref 3.5–5.1)
Total Bilirubin: 0.1 mg/dL — ABNORMAL LOW (ref 0.3–1.2)

## 2012-02-16 LAB — HEMOGLOBIN A1C: Mean Plasma Glucose: 108 mg/dL (ref <117)

## 2012-02-16 LAB — CBC
Platelets: 335 10*3/uL (ref 150–400)
RDW: 14.5 % (ref 11.5–15.5)
WBC: 6.4 10*3/uL (ref 4.0–10.5)

## 2012-02-16 LAB — URINALYSIS, ROUTINE W REFLEX MICROSCOPIC
Hgb urine dipstick: NEGATIVE
Protein, ur: 100 mg/dL — AB
Urobilinogen, UA: 0.2 mg/dL (ref 0.0–1.0)

## 2012-02-16 LAB — APTT: aPTT: 32 seconds (ref 24–37)

## 2012-02-16 LAB — TYPE AND SCREEN
ABO/RH(D): O POS
Antibody Screen: NEGATIVE

## 2012-02-16 LAB — URINE MICROSCOPIC-ADD ON

## 2012-02-16 MED ORDER — ALBUTEROL SULFATE (5 MG/ML) 0.5% IN NEBU
2.5000 mg | INHALATION_SOLUTION | Freq: Once | RESPIRATORY_TRACT | Status: AC
  Administered 2012-02-16: 2.5 mg via RESPIRATORY_TRACT

## 2012-02-16 NOTE — Consult Note (Signed)
Anesthesia Chart Review:  Patient is a 56 year old female scheduled for a AVR for symptomatic, severe AS on 02/20/12.  History includes post-operative N/V, smoking, GERD, depression, childhood seizures, HTN, deafness, hypothyroidism, CHF, chronic BP, dyslipidemia, PUD, mild CAD by 12/2011 cath.  Cardiologist is Dr. Mariah Milling.  Cardiac cath from [facility] on 12/16/11 showed mid LAD 30%, mid CX 50%, distal CX 40%, small nondominant RCA with moderate atherosclerosis.  Echo from 11/01/11 showed severe AS, estimated AVA of 0.7 cm sq, mean gradient of 56 mm Hg, peak gradient of 97 mm Hg.  LV systolic function normal, EF > 55%, impaired LV relaxation, mild LVH, LA mildly dilated, mild TR, RV systolic pressure 30-400 mm Hg.  EKG from 02/16/12 shows NSR, LVH with repolarization abnormality, possible anteroseptal infarct (age undetermined).  02/16/12 CXR shows no active disease.  Labs noted.  She refused ABG at her PAT appointment.  This will have to be done on the day of surgery.  Nursing Staff to follow-up results of dopplers and PFTs.  Shonna Chock, PA-C

## 2012-02-16 NOTE — Progress Notes (Signed)
Pt refused blood gas @ PAT, left message with Montez Morita, Charity fundraiser at [place] office.  Pt stated "can not use that wash" instructed to use regular soap  night before and morning of surgery.

## 2012-02-16 NOTE — Progress Notes (Signed)
VASCULAR LAB PRELIMINARY  PRELIMINARY  PRELIMINARY  PRELIMINARY  No significant ICA stenosis bilaterally.  Palmar arch signals are normal with radial compression and obliterates with ulnar compression.  [name], 02/16/2012, 6:56 PM

## 2012-02-16 NOTE — Pre-Procedure Instructions (Signed)
20 [name]  02/16/2012   Your procedure is scheduled on:  Feb 20, 2012 (Mon)  Report to [facility] at 5:30 AM.  Call this number if you have problems the morning of surgery: [phone]   Remember:   Do not eat food:After Midnight.  May have clear liquids: up to 4 Hours before arrival.  Clear liquids include soda, tea, black coffee, apple or grape juice, broth.  Take these medicines the morning of surgery with A SIP OF WATER: lopressor, synthroid, prilosec   Do not wear jewelry, make-up or nail polish.  Do not wear lotions, powders, or perfumes. You may wear deodorant.  Do not shave 48 hours prior to surgery. Men may shave face and neck.  Do not bring valuables to the hospital.  Contacts, dentures or bridgework may not be worn into surgery.  Leave suitcase in the car. After surgery it may be brought to your room.  For patients admitted to the hospital, checkout time is 11:00 AM the day of discharge.   Patients discharged the day of surgery will not be allowed to drive home.  Name and phone number of your driver: N/A  Special Instructions: Incentive Spirometry - Practice and bring it with you on the day of surgery. and CHG Shower Use Special Wash: 1/2 bottle night before surgery and 1/2 bottle morning of surgery.   Please read over the following fact sheets that you were given: Pain Booklet, Coughing and Deep Breathing, Blood Transfusion Information and Surgical Site Infection Prevention

## 2012-02-19 MED ORDER — TRANEXAMIC ACID 100 MG/ML IV SOLN
1.5000 mg/kg/h | INTRAVENOUS | Status: AC
  Administered 2012-02-20: 1.5 mg/kg/h via INTRAVENOUS
  Filled 2012-02-19: qty 25

## 2012-02-19 MED ORDER — TRANEXAMIC ACID (OHS) BOLUS VIA INFUSION
15.0000 mg/kg | INTRAVENOUS | Status: AC
  Administered 2012-02-20: 877.5 mg via INTRAVENOUS
  Filled 2012-02-19: qty 878

## 2012-02-19 MED ORDER — SODIUM CHLORIDE 0.9 % IV SOLN
INTRAVENOUS | Status: AC
  Administered 2012-02-20: 1.3 [IU]/h via INTRAVENOUS
  Filled 2012-02-19: qty 1

## 2012-02-19 MED ORDER — POTASSIUM CHLORIDE 2 MEQ/ML IV SOLN
80.0000 meq | INTRAVENOUS | Status: DC
  Filled 2012-02-19: qty 40

## 2012-02-19 MED ORDER — SODIUM BICARBONATE 8.4 % IV SOLN
INTRAVENOUS | Status: DC
  Filled 2012-02-19: qty 2.5

## 2012-02-19 MED ORDER — SODIUM CHLORIDE 0.9 % IV SOLN
0.1000 ug/kg/h | INTRAVENOUS | Status: AC
  Administered 2012-02-20: .2 ug/kg/h via INTRAVENOUS
  Filled 2012-02-19: qty 4

## 2012-02-19 MED ORDER — PHENYLEPHRINE HCL 10 MG/ML IJ SOLN
30.0000 ug/min | INTRAMUSCULAR | Status: AC
  Administered 2012-02-20: 20 ug/min via INTRAVENOUS
  Filled 2012-02-19: qty 2

## 2012-02-19 MED ORDER — MAGNESIUM SULFATE 50 % IJ SOLN
40.0000 meq | INTRAMUSCULAR | Status: DC
  Filled 2012-02-19: qty 10

## 2012-02-19 MED ORDER — VANCOMYCIN HCL 1000 MG IV SOLR
1250.0000 mg | INTRAVENOUS | Status: AC
  Administered 2012-02-20: 1250 mg via INTRAVENOUS
  Filled 2012-02-19: qty 1250

## 2012-02-19 MED ORDER — TRANEXAMIC ACID (OHS) PUMP PRIME SOLUTION
2.0000 mg/kg | INTRAVENOUS | Status: DC
  Filled 2012-02-19: qty 1.17

## 2012-02-19 MED ORDER — DEXTROSE 5 % IV SOLN
0.5000 ug/min | INTRAVENOUS | Status: DC
  Filled 2012-02-19: qty 4

## 2012-02-19 MED ORDER — DEXTROSE 5 % IV SOLN
1.5000 g | INTRAVENOUS | Status: AC
  Administered 2012-02-20: 1.5 g via INTRAVENOUS
  Administered 2012-02-20: .75 g via INTRAVENOUS
  Filled 2012-02-19: qty 1.5

## 2012-02-19 MED ORDER — NITROGLYCERIN IN D5W 200-5 MCG/ML-% IV SOLN
2.0000 ug/min | INTRAVENOUS | Status: AC
  Administered 2012-02-20: .5 ug/kg/min via INTRAVENOUS
  Filled 2012-02-19: qty 250

## 2012-02-19 MED ORDER — DEXTROSE 5 % IV SOLN
750.0000 mg | INTRAVENOUS | Status: DC
  Filled 2012-02-19: qty 750

## 2012-02-19 MED ORDER — DOPAMINE-DEXTROSE 3.2-5 MG/ML-% IV SOLN
2.0000 ug/kg/min | INTRAVENOUS | Status: AC
  Administered 2012-02-20: 3 ug/kg/min via INTRAVENOUS
  Filled 2012-02-19: qty 250

## 2012-02-20 ENCOUNTER — Encounter (HOSPITAL_COMMUNITY): Payer: Self-pay | Admitting: *Deleted

## 2012-02-20 ENCOUNTER — Ambulatory Visit (HOSPITAL_COMMUNITY): Payer: Medicaid Other | Admitting: Vascular Surgery

## 2012-02-20 ENCOUNTER — Encounter (HOSPITAL_COMMUNITY)
Admission: RE | Disposition: A | Discharge status: Home / Self Care | Payer: Self-pay | Source: Ambulatory Visit | Attending: Thoracic Surgery (Cardiothoracic Vascular Surgery)

## 2012-02-20 ENCOUNTER — Encounter (HOSPITAL_COMMUNITY): Payer: Self-pay | Admitting: Vascular Surgery

## 2012-02-20 ENCOUNTER — Inpatient Hospital Stay (HOSPITAL_COMMUNITY)
Admission: RE | Admit: 2012-02-20 | Discharge: 2012-02-29 | DRG: 221 | Disposition: A | Discharge status: Home / Self Care | Payer: Medicaid Other | Source: Ambulatory Visit | Attending: Thoracic Surgery (Cardiothoracic Vascular Surgery) | Admitting: Thoracic Surgery (Cardiothoracic Vascular Surgery)

## 2012-02-20 ENCOUNTER — Inpatient Hospital Stay (HOSPITAL_COMMUNITY): Payer: Medicaid Other

## 2012-02-20 DIAGNOSIS — I359 Nonrheumatic aortic valve disorder, unspecified: Principal | ICD-10-CM | POA: Diagnosis present

## 2012-02-20 DIAGNOSIS — F17200 Nicotine dependence, unspecified, uncomplicated: Secondary | ICD-10-CM | POA: Diagnosis present

## 2012-02-20 DIAGNOSIS — K219 Gastro-esophageal reflux disease without esophagitis: Secondary | ICD-10-CM | POA: Diagnosis present

## 2012-02-20 DIAGNOSIS — I2510 Atherosclerotic heart disease of native coronary artery without angina pectoris: Secondary | ICD-10-CM | POA: Diagnosis present

## 2012-02-20 DIAGNOSIS — G40909 Epilepsy, unspecified, not intractable, without status epilepticus: Secondary | ICD-10-CM | POA: Diagnosis present

## 2012-02-20 DIAGNOSIS — E039 Hypothyroidism, unspecified: Secondary | ICD-10-CM | POA: Diagnosis present

## 2012-02-20 DIAGNOSIS — I10 Essential (primary) hypertension: Secondary | ICD-10-CM | POA: Diagnosis present

## 2012-02-20 DIAGNOSIS — R1310 Dysphagia, unspecified: Secondary | ICD-10-CM | POA: Diagnosis not present

## 2012-02-20 DIAGNOSIS — Z9089 Acquired absence of other organs: Secondary | ICD-10-CM

## 2012-02-20 DIAGNOSIS — E785 Hyperlipidemia, unspecified: Secondary | ICD-10-CM | POA: Diagnosis present

## 2012-02-20 DIAGNOSIS — H9190 Unspecified hearing loss, unspecified ear: Secondary | ICD-10-CM | POA: Diagnosis present

## 2012-02-20 DIAGNOSIS — F3289 Other specified depressive episodes: Secondary | ICD-10-CM | POA: Diagnosis present

## 2012-02-20 DIAGNOSIS — F329 Major depressive disorder, single episode, unspecified: Secondary | ICD-10-CM | POA: Diagnosis present

## 2012-02-20 HISTORY — PX: AORTIC VALVE REPLACEMENT: SHX41

## 2012-02-20 LAB — POCT I-STAT 4, (NA,K, GLUC, HGB,HCT)
Glucose, Bld: 102 mg/dL — ABNORMAL HIGH (ref 70–99)
Glucose, Bld: 90 mg/dL (ref 70–99)
Glucose, Bld: 104 mg/dL — ABNORMAL HIGH (ref 70–99)
HCT: 35 % — ABNORMAL LOW (ref 36.0–46.0)
HCT: 23 % — ABNORMAL LOW (ref 36.0–46.0)
HCT: 33 % — ABNORMAL LOW (ref 36.0–46.0)
HCT: 26 % — ABNORMAL LOW (ref 36.0–46.0)
Hemoglobin: 11.9 g/dL — ABNORMAL LOW (ref 12.0–15.0)
Hemoglobin: 7.8 g/dL — ABNORMAL LOW (ref 12.0–15.0)
Hemoglobin: 10.5 g/dL — ABNORMAL LOW (ref 12.0–15.0)
Potassium: 3.9 mEq/L (ref 3.5–5.1)
Potassium: 3 mEq/L — ABNORMAL LOW (ref 3.5–5.1)
Potassium: 3.4 mEq/L — ABNORMAL LOW (ref 3.5–5.1)
Potassium: 3.5 mEq/L (ref 3.5–5.1)
Sodium: 140 mEq/L (ref 135–145)
Sodium: 140 mEq/L (ref 135–145)
Sodium: 143 mEq/L (ref 135–145)

## 2012-02-20 LAB — POCT I-STAT 3, ART BLOOD GAS (G3+)
Acid-base deficit: 3 mmol/L — ABNORMAL HIGH (ref 0.0–2.0)
Bicarbonate: 20.9 mEq/L (ref 20.0–24.0)
O2 Saturation: 100 %
O2 Saturation: 93 %
TCO2: 20 mmol/L (ref 0–100)
TCO2: 22 mmol/L (ref 0–100)
TCO2: 23 mmol/L (ref 0–100)
pCO2 arterial: 35.4 mmHg (ref 35.0–45.0)
pCO2 arterial: 36.6 mmHg (ref 35.0–45.0)
pH, Arterial: 7.311 — ABNORMAL LOW (ref 7.350–7.400)
pH, Arterial: 7.379 (ref 7.350–7.400)
pH, Arterial: 7.383 (ref 7.350–7.400)
pO2, Arterial: 372 mmHg — ABNORMAL HIGH (ref 80.0–100.0)
pO2, Arterial: 62 mmHg — ABNORMAL LOW (ref 80.0–100.0)

## 2012-02-20 LAB — CBC
HCT: 26.5 % — ABNORMAL LOW (ref 36.0–46.0)
Hemoglobin: 8.9 g/dL — ABNORMAL LOW (ref 12.0–15.0)
MCH: 29.9 pg (ref 26.0–34.0)
MCHC: 33.6 g/dL (ref 30.0–36.0)
MCHC: 33.5 g/dL (ref 30.0–36.0)
MCV: 88.8 fL (ref 78.0–100.0)
Platelets: 152 10*3/uL (ref 150–400)
RDW: 15.1 % (ref 11.5–15.5)
WBC: 4.8 10*3/uL (ref 4.0–10.5)

## 2012-02-20 LAB — POCT I-STAT, CHEM 8
Hemoglobin: 9.2 g/dL — ABNORMAL LOW (ref 12.0–15.0)
Sodium: 139 mEq/L (ref 135–145)
TCO2: 19 mmol/L (ref 0–100)

## 2012-02-20 LAB — PLATELET COUNT: Platelets: 194 10*3/uL (ref 150–400)

## 2012-02-20 LAB — CREATININE, SERUM: GFR calc non Af Amer: 77 mL/min — ABNORMAL LOW (ref >90)

## 2012-02-20 LAB — GLUCOSE, CAPILLARY
Glucose-Capillary: 104 mg/dL — ABNORMAL HIGH (ref 70–99)
Glucose-Capillary: 101 mg/dL — ABNORMAL HIGH (ref 70–99)

## 2012-02-20 SURGERY — REPLACEMENT, AORTIC VALVE, OPEN
Anesthesia: General | Site: Chest | Wound class: Clean

## 2012-02-20 MED ORDER — NITROGLYCERIN IN D5W 200-5 MCG/ML-% IV SOLN: 0.0000 ug/min | INTRAVENOUS | Status: DC

## 2012-02-20 MED ORDER — PNEUMOCOCCAL VAC POLYVALENT 25 MCG/0.5ML IJ INJ: 0.5000 mL | INJECTION | INTRAMUSCULAR | Status: DC | PRN

## 2012-02-20 MED ORDER — MORPHINE SULFATE 2 MG/ML IJ SOLN: 1.0000 mg | INTRAMUSCULAR | Status: AC | PRN

## 2012-02-20 MED ORDER — MAGNESIUM SULFATE 40 MG/ML IJ SOLN
4.0000 g | Freq: Once | INTRAMUSCULAR | Status: AC
  Administered 2012-02-20: 4 g via INTRAVENOUS
  Filled 2012-02-20: qty 100

## 2012-02-20 MED ORDER — SUCCINYLCHOLINE CHLORIDE 20 MG/ML IJ SOLN
INTRAMUSCULAR | Status: DC | PRN
  Administered 2012-02-20: 120 mg via INTRAVENOUS

## 2012-02-20 MED ORDER — INSULIN ASPART 100 UNIT/ML ~~LOC~~ SOLN: 0.0000 [IU] | SUBCUTANEOUS | Status: DC

## 2012-02-20 MED ORDER — SODIUM CHLORIDE 0.9 % IJ SOLN
3.0000 mL | Freq: Two times a day (BID) | INTRAMUSCULAR | Status: DC
  Administered 2012-02-21 – 2012-02-24 (×8): 3 mL via INTRAVENOUS

## 2012-02-20 MED ORDER — POTASSIUM CHLORIDE 10 MEQ/50ML IV SOLN
10.0000 meq | INTRAVENOUS | Status: AC
  Administered 2012-02-20 (×3): 10 meq via INTRAVENOUS

## 2012-02-20 MED ORDER — DOCUSATE SODIUM 100 MG PO CAPS
200.0000 mg | ORAL_CAPSULE | Freq: Every day | ORAL | Status: DC
  Administered 2012-02-25 – 2012-02-28 (×3): 200 mg via ORAL
  Filled 2012-02-20 (×6): qty 2

## 2012-02-20 MED ORDER — ONDANSETRON HCL 4 MG/2ML IJ SOLN
4.0000 mg | Freq: Four times a day (QID) | INTRAMUSCULAR | Status: DC | PRN
Start: 2012-02-20 — End: 2012-02-29
  Administered 2012-02-21 – 2012-02-29 (×2): 4 mg via INTRAVENOUS
  Filled 2012-02-20 (×2): qty 2

## 2012-02-20 MED ORDER — SODIUM CHLORIDE 0.9 % IV SOLN
INTRAVENOUS | Status: DC
  Filled 2012-02-20: qty 1

## 2012-02-20 MED ORDER — SODIUM CHLORIDE 0.9 % IJ SOLN
3.0000 mL | INTRAMUSCULAR | Status: DC | PRN
  Administered 2012-02-22 – 2012-02-24 (×2): 3 mL via INTRAVENOUS

## 2012-02-20 MED ORDER — MORPHINE SULFATE 2 MG/ML IJ SOLN
2.0000 mg | INTRAMUSCULAR | Status: DC | PRN
  Administered 2012-02-20: 2 mg via INTRAVENOUS
  Filled 2012-02-20: qty 1

## 2012-02-20 MED ORDER — PROPOFOL 10 MG/ML IV EMUL
INTRAVENOUS | Status: DC | PRN
  Administered 2012-02-20: 30 mg via INTRAVENOUS
  Administered 2012-02-20: 20 mg via INTRAVENOUS

## 2012-02-20 MED ORDER — VANCOMYCIN HCL IN DEXTROSE 1-5 GM/200ML-% IV SOLN
1000.0000 mg | Freq: Once | INTRAVENOUS | Status: AC
  Administered 2012-02-20: 1000 mg via INTRAVENOUS
  Filled 2012-02-20: qty 200

## 2012-02-20 MED ORDER — ACETAMINOPHEN 160 MG/5ML PO SOLN: 650.0000 mg | ORAL | Status: AC

## 2012-02-20 MED ORDER — METOPROLOL TARTRATE 12.5 MG HALF TABLET
12.5000 mg | ORAL_TABLET | Freq: Two times a day (BID) | ORAL | Status: DC
  Administered 2012-02-22 (×2): 12.5 mg via ORAL
  Filled 2012-02-20 (×7): qty 1

## 2012-02-20 MED ORDER — ASPIRIN EC 325 MG PO TBEC
325.0000 mg | DELAYED_RELEASE_TABLET | Freq: Every day | ORAL | Status: DC
  Filled 2012-02-20: qty 1

## 2012-02-20 MED ORDER — LACTATED RINGERS IV SOLN: INTRAVENOUS | Status: DC

## 2012-02-20 MED ORDER — ALBUMIN HUMAN 5 % IV SOLN
INTRAVENOUS | Status: DC | PRN
  Administered 2012-02-20 (×3): via INTRAVENOUS

## 2012-02-20 MED ORDER — LACTATED RINGERS IV SOLN: 500.0000 mL | Freq: Once | INTRAVENOUS | Status: AC | PRN

## 2012-02-20 MED ORDER — SODIUM CHLORIDE 0.9 % IR SOLN
Status: DC | PRN
  Administered 2012-02-20: 09:00:00

## 2012-02-20 MED ORDER — METOPROLOL TARTRATE 25 MG/10 ML ORAL SUSPENSION
12.5000 mg | Freq: Two times a day (BID) | ORAL | Status: DC
  Filled 2012-02-20 (×7): qty 5

## 2012-02-20 MED ORDER — SODIUM CHLORIDE 0.9 % IV SOLN
INTRAVENOUS | Status: DC | PRN
  Administered 2012-02-20: 08:00:00 via INTRAVENOUS

## 2012-02-20 MED ORDER — FENTANYL CITRATE 0.05 MG/ML IJ SOLN
INTRAMUSCULAR | Status: DC | PRN
  Administered 2012-02-20: 800 ug via INTRAVENOUS
  Administered 2012-02-20: 100 ug via INTRAVENOUS
  Administered 2012-02-20: 50 ug via INTRAVENOUS
  Administered 2012-02-20: 150 ug via INTRAVENOUS

## 2012-02-20 MED ORDER — SODIUM CHLORIDE 0.9 % IV SOLN
0.1000 ug/kg/h | INTRAVENOUS | Status: DC
  Filled 2012-02-20: qty 2

## 2012-02-20 MED ORDER — BISACODYL 10 MG RE SUPP
10.0000 mg | Freq: Every day | RECTAL | Status: DC
  Administered 2012-02-22: 10 mg via RECTAL
  Filled 2012-02-20: qty 1

## 2012-02-20 MED ORDER — ASPIRIN 81 MG PO CHEW: 324.0000 mg | CHEWABLE_TABLET | Freq: Every day | ORAL | Status: DC

## 2012-02-20 MED ORDER — ACETAMINOPHEN 500 MG PO TABS
1000.0000 mg | ORAL_TABLET | Freq: Four times a day (QID) | ORAL | Status: DC
  Filled 2012-02-20 (×4): qty 2

## 2012-02-20 MED ORDER — VECURONIUM BROMIDE 10 MG IV SOLR
INTRAVENOUS | Status: DC | PRN
  Administered 2012-02-20: 5 mg via INTRAVENOUS
  Administered 2012-02-20: 2 mg via INTRAVENOUS
  Administered 2012-02-20: 3 mg via INTRAVENOUS

## 2012-02-20 MED ORDER — MIDAZOLAM HCL 2 MG/2ML IJ SOLN: 2.0000 mg | INTRAMUSCULAR | Status: DC | PRN

## 2012-02-20 MED ORDER — METOPROLOL TARTRATE 1 MG/ML IV SOLN
2.5000 mg | INTRAVENOUS | Status: DC | PRN
  Administered 2012-02-21 – 2012-02-22 (×5): 5 mg via INTRAVENOUS
  Administered 2012-02-22: 2.5 mg via INTRAVENOUS
  Administered 2012-02-23 – 2012-02-24 (×6): 5 mg via INTRAVENOUS
  Filled 2012-02-20 (×11): qty 5

## 2012-02-20 MED ORDER — ALBUMIN HUMAN 5 % IV SOLN
250.0000 mL | INTRAVENOUS | Status: AC | PRN
  Administered 2012-02-20 (×3): 250 mL via INTRAVENOUS
  Filled 2012-02-20: qty 250

## 2012-02-20 MED ORDER — FAMOTIDINE IN NACL 20-0.9 MG/50ML-% IV SOLN
20.0000 mg | Freq: Two times a day (BID) | INTRAVENOUS | Status: AC
  Administered 2012-02-20: 20 mg via INTRAVENOUS

## 2012-02-20 MED ORDER — PANTOPRAZOLE SODIUM 40 MG PO TBEC: 40.0000 mg | DELAYED_RELEASE_TABLET | Freq: Every day | ORAL | Status: DC

## 2012-02-20 MED ORDER — OXYCODONE HCL 5 MG PO TABS: 5.0000 mg | ORAL_TABLET | ORAL | Status: DC | PRN

## 2012-02-20 MED ORDER — HEPARIN SODIUM (PORCINE) 1000 UNIT/ML IJ SOLN
INTRAMUSCULAR | Status: DC | PRN
  Administered 2012-02-20: 16000 [IU] via INTRAVENOUS

## 2012-02-20 MED ORDER — CHLORHEXIDINE GLUCONATE 4 % EX LIQD: 30.0000 mL | CUTANEOUS | Status: DC

## 2012-02-20 MED ORDER — HEMOSTATIC AGENTS (NO CHARGE) OPTIME
TOPICAL | Status: DC | PRN
  Administered 2012-02-20: 3 via TOPICAL

## 2012-02-20 MED ORDER — BISACODYL 5 MG PO TBEC
10.0000 mg | DELAYED_RELEASE_TABLET | Freq: Every day | ORAL | Status: DC
  Administered 2012-02-23 – 2012-02-25 (×2): 10 mg via ORAL
  Filled 2012-02-20 (×2): qty 2

## 2012-02-20 MED ORDER — ACETAMINOPHEN 650 MG RE SUPP
650.0000 mg | RECTAL | Status: AC
  Administered 2012-02-20: 650 mg via RECTAL

## 2012-02-20 MED ORDER — INSULIN REGULAR BOLUS VIA INFUSION
0.0000 [IU] | Freq: Three times a day (TID) | INTRAVENOUS | Status: DC
  Filled 2012-02-20: qty 10

## 2012-02-20 MED ORDER — SODIUM CHLORIDE 0.9 % IV SOLN: 250.0000 mL | INTRAVENOUS | Status: DC

## 2012-02-20 MED ORDER — PHENYLEPHRINE HCL 10 MG/ML IJ SOLN
0.0000 ug/min | INTRAVENOUS | Status: DC
  Filled 2012-02-20: qty 2

## 2012-02-20 MED ORDER — DOPAMINE-DEXTROSE 3.2-5 MG/ML-% IV SOLN: 0.0000 ug/kg/min | INTRAVENOUS | Status: DC

## 2012-02-20 MED ORDER — MIDAZOLAM HCL 5 MG/5ML IJ SOLN
INTRAMUSCULAR | Status: DC | PRN
  Administered 2012-02-20: 2 mg via INTRAVENOUS
  Administered 2012-02-20: 3 mg via INTRAVENOUS
  Administered 2012-02-20: 2 mg via INTRAVENOUS
  Administered 2012-02-20: 3 mg via INTRAVENOUS

## 2012-02-20 MED ORDER — LACTATED RINGERS IV SOLN
INTRAVENOUS | Status: DC | PRN
  Administered 2012-02-20 (×2): via INTRAVENOUS

## 2012-02-20 MED ORDER — ACETAMINOPHEN 160 MG/5ML PO SOLN: 975.0000 mg | Freq: Four times a day (QID) | ORAL | Status: DC

## 2012-02-20 MED ORDER — PROTAMINE SULFATE 10 MG/ML IV SOLN
INTRAVENOUS | Status: DC | PRN
  Administered 2012-02-20 (×2): 50 mg via INTRAVENOUS
  Administered 2012-02-20: 200 mg via INTRAVENOUS
  Administered 2012-02-20: 450 mg via INTRAVENOUS

## 2012-02-20 MED ORDER — INSULIN ASPART 100 UNIT/ML ~~LOC~~ SOLN
0.0000 [IU] | SUBCUTANEOUS | Status: AC
  Administered 2012-02-20: 2 [IU] via SUBCUTANEOUS

## 2012-02-20 MED ORDER — LIDOCAINE HCL (CARDIAC) 20 MG/ML IV SOLN
INTRAVENOUS | Status: DC | PRN
  Administered 2012-02-20: 50 mg via INTRAVENOUS

## 2012-02-20 MED ORDER — ROCURONIUM BROMIDE 100 MG/10ML IV SOLN
INTRAVENOUS | Status: DC | PRN
  Administered 2012-02-20: 10 mg via INTRAVENOUS
  Administered 2012-02-20: 40 mg via INTRAVENOUS

## 2012-02-20 MED ORDER — LEVOTHYROXINE SODIUM 50 MCG PO TABS
50.0000 ug | ORAL_TABLET | Freq: Every day | ORAL | Status: DC
  Filled 2012-02-20 (×3): qty 1

## 2012-02-20 MED ORDER — METOPROLOL TARTRATE 12.5 MG HALF TABLET
12.5000 mg | ORAL_TABLET | Freq: Once | ORAL | Status: AC
  Administered 2012-02-20: 12.5 mg via ORAL
  Filled 2012-02-20: qty 1

## 2012-02-20 MED ORDER — SODIUM CHLORIDE 0.9 % IV SOLN: INTRAVENOUS | Status: DC

## 2012-02-20 MED ORDER — DEXTROSE 5 % IV SOLN
INTRAVENOUS | Status: DC | PRN
  Administered 2012-02-20: 08:00:00 via INTRAVENOUS

## 2012-02-20 MED ORDER — SODIUM CHLORIDE 0.45 % IV SOLN
INTRAVENOUS | Status: DC
  Administered 2012-02-20: 12:00:00 via INTRAVENOUS

## 2012-02-20 MED ORDER — DEXTROSE 5 % IV SOLN
1.5000 g | Freq: Two times a day (BID) | INTRAVENOUS | Status: AC
  Administered 2012-02-20 – 2012-02-22 (×4): 1.5 g via INTRAVENOUS
  Filled 2012-02-20 (×4): qty 1.5

## 2012-02-20 SURGICAL SUPPLY — 74 items
ADAPTER CARDIO PERF ANTE/RETRO (ADAPTER) ×2 IMPLANT
ADH SRG 12 PREFL SYR 3 SPRDR (MISCELLANEOUS)
ADPR PRFSN 84XANTGRD RTRGD (ADAPTER) ×1
APPLICATOR COTTON TIP 6IN STRL (MISCELLANEOUS) IMPLANT
ATTRACTOMAT 16X20 MAGNETIC DRP (DRAPES) ×2 IMPLANT
BAG DECANTER FOR FLEXI CONT (MISCELLANEOUS) ×2 IMPLANT
BLADE STERNUM SYSTEM 6 (BLADE) ×2 IMPLANT
BLADE SURG 15 STRL LF DISP TIS (BLADE) ×1 IMPLANT
BLADE SURG 15 STRL SS (BLADE) ×2
CANISTER SUCTION 2500CC (MISCELLANEOUS) ×2 IMPLANT
CANNULA GUNDRY RCSP 15FR (MISCELLANEOUS) ×2 IMPLANT
CATH CPB KIT HENDRICKSON (MISCELLANEOUS) ×2 IMPLANT
CATH HEART VENT LEFT (CATHETERS) ×1 IMPLANT
CATH ROBINSON RED A/P 18FR (CATHETERS) ×4 IMPLANT
CATH THORACIC 36FR RT ANG (CATHETERS) ×4 IMPLANT
CLIP FOGARTY SPRING 6M (CLIP) IMPLANT
CLOTH BEACON ORANGE TIMEOUT ST (SAFETY) ×2 IMPLANT
COVER SURGICAL LIGHT HANDLE (MISCELLANEOUS) ×4 IMPLANT
CRADLE DONUT ADULT HEAD (MISCELLANEOUS) ×2 IMPLANT
DRAIN CHANNEL 28F RND 3/8 FF (WOUND CARE) ×1 IMPLANT
DRAPE SLUSH MACHINE 52X66 (DRAPES) IMPLANT
DRAPE SLUSH/WARMER DISC (DRAPES) ×1 IMPLANT
DRSG COVADERM 4X14 (GAUZE/BANDAGES/DRESSINGS) ×2 IMPLANT
ELECT REM PT RETURN 9FT ADLT (ELECTROSURGICAL) ×4
ELECTRODE REM PT RTRN 9FT ADLT (ELECTROSURGICAL) ×2 IMPLANT
GLOVE EUDERMIC 7 POWDERFREE (GLOVE) ×6 IMPLANT
GOWN STRL NON-REIN LRG LVL3 (GOWN DISPOSABLE) ×8 IMPLANT
HEMOSTAT POWDER SURGIFOAM 1G (HEMOSTASIS) ×6 IMPLANT
HEMOSTAT SURGICEL 2X14 (HEMOSTASIS) ×2 IMPLANT
INSERT FOGARTY XLG (MISCELLANEOUS) IMPLANT
KIT BASIN OR (CUSTOM PROCEDURE TRAY) ×2 IMPLANT
KIT ROOM TURNOVER OR (KITS) ×2 IMPLANT
KIT SUCTION CATH 14FR (SUCTIONS) ×4 IMPLANT
LINE VENT (MISCELLANEOUS) ×1 IMPLANT
NS IRRIG 1000ML POUR BTL (IV SOLUTION) ×10 IMPLANT
PACK OPEN HEART (CUSTOM PROCEDURE TRAY) ×2 IMPLANT
PAD ARMBOARD 7.5X6 YLW CONV (MISCELLANEOUS) ×4 IMPLANT
SET CARDIOPLEGIA MPS 5001102 (MISCELLANEOUS) ×1 IMPLANT
SPONGE GAUZE 4X4 12PLY (GAUZE/BANDAGES/DRESSINGS) ×3 IMPLANT
SUT BONE WAX W31G (SUTURE) ×2 IMPLANT
SUT ETHIBON 2 0 V 52N 30 (SUTURE) ×4 IMPLANT
SUT ETHIBON EXCEL 2-0 V-5 (SUTURE) IMPLANT
SUT ETHIBOND 2 0 SH (SUTURE) ×2
SUT ETHIBOND 2 0 SH 36X2 (SUTURE) ×1 IMPLANT
SUT ETHIBOND 2 0 V4 (SUTURE) IMPLANT
SUT ETHIBOND 2 0V4 GREEN (SUTURE) IMPLANT
SUT ETHIBOND 4 0 RB 1 (SUTURE) IMPLANT
SUT ETHIBOND NAB MH 2-0 36IN (SUTURE) ×1 IMPLANT
SUT ETHIBOND V-5 VALVE (SUTURE) IMPLANT
SUT PROLENE 3 0 SH 1 (SUTURE) IMPLANT
SUT PROLENE 3 0 SH DA (SUTURE) ×2 IMPLANT
SUT PROLENE 4 0 RB 1 (SUTURE) ×8
SUT PROLENE 4-0 RB1 .5 CRCL 36 (SUTURE) IMPLANT
SUT PROLENE 5 0 C 1 36 (SUTURE) ×1 IMPLANT
SUT SILK  1 MH (SUTURE) ×1
SUT SILK 1 MH (SUTURE) ×1 IMPLANT
SUT STEEL 6MS V (SUTURE) ×1 IMPLANT
SUT STEEL SZ 6 DBL 3X14 BALL (SUTURE) ×1 IMPLANT
SUT VIC AB 1 CTX 36 (SUTURE) ×6
SUT VIC AB 1 CTX36XBRD ANBCTR (SUTURE) ×2 IMPLANT
SUT VIC AB 2-0 CTX 27 (SUTURE) IMPLANT
SUT VIC AB 3-0 X1 27 (SUTURE) IMPLANT
SUTURE E-PAK OPEN HEART (SUTURE) ×2 IMPLANT
SYR 10ML KIT SKIN ADHESIVE (MISCELLANEOUS) IMPLANT
SYSTEM SAHARA CHEST DRAIN ATS (WOUND CARE) ×2 IMPLANT
TAPE CLOTH SURG 4X10 WHT LF (GAUZE/BANDAGES/DRESSINGS) ×1 IMPLANT
TOWEL OR 17X24 6PK STRL BLUE (TOWEL DISPOSABLE) ×2 IMPLANT
TOWEL OR 17X26 10 PK STRL BLUE (TOWEL DISPOSABLE) ×2 IMPLANT
TRAY FOLEY IC TEMP SENS 14FR (CATHETERS) ×2 IMPLANT
TUBE FEEDING 8FR 16IN STR KANG (MISCELLANEOUS) ×2 IMPLANT
UNDERPAD 30X30 INCONTINENT (UNDERPADS AND DIAPERS) ×2 IMPLANT
VALVE MAGNA EASE AORTIC 19MM (Prosthesis & Implant Heart) ×1 IMPLANT
VENT LEFT HEART 12002 (CATHETERS) ×2
WATER STERILE IRR 1000ML POUR (IV SOLUTION) ×4 IMPLANT

## 2012-02-20 NOTE — Brief Op Note (Addendum)
                   [address]            [name] [address]          [phone]    02/20/2012  10:39 AM  PATIENT:  [name]  56 y.o. female  PRE-OPERATIVE DIAGNOSIS:  AORTIC STENOSIS  POST-OPERATIVE DIAGNOSIS:  aortic stenosis  PROCEDURE:  Procedure(s): AORTIC VALVE REPLACEMENT (AVR) #19 MAGNAEASE BIOPROSTHETIC  SURGEON:  Surgeon(s): Loreli Slot, MD  PHYSICIAN ASSISTANT: WAYNE GOLD PA-C  ANESTHESIA:   general  PATIENT CONDITION:  ICU - intubated and hemodynamically stable.  PRE-OPERATIVE WEIGHT: 59kg  COMPLICATIONS: NO KNOWN   XC= 66 min CPB= 101 min  Bicuspid valve with fusion of L and R cusps, heavily calcified

## 2012-02-20 NOTE — Interval H&P Note (Signed)
History and Physical Interval Note:  02/20/2012 7:32 AM  [name]  has presented today for surgery, with the diagnosis of AORTIC STENOSIS  The various methods of treatment have been discussed with the patient and family. After consideration of risks, benefits and other options for treatment, the patient has consented to  Procedure(s) (LRB): AORTIC VALVE REPLACEMENT (AVR) (N/A) as a surgical intervention .  The patients' history has been reviewed, patient examined, no change in status, stable for surgery.  I have reviewed the patients' chart and labs.  Questions were answered to the patient's satisfaction.     Kirby Argueta C

## 2012-02-20 NOTE — Progress Notes (Signed)
UR Completed. Philopater Mucha, RN, Nurse Case Manager [phone]     

## 2012-02-20 NOTE — Anesthesia Procedure Notes (Signed)
Procedure Name: Intubation Date/Time: 02/20/2012 8:00 AM Performed by: Julianne Rice K Pre-anesthesia Checklist: Patient identified, Timeout performed, Emergency Drugs available, Suction available and Patient being monitored Patient Re-evaluated:Patient Re-evaluated prior to inductionOxygen Delivery Method: Circle system utilized Preoxygenation: Pre-oxygenation with 100% oxygen Ventilation: Mask ventilation without difficulty Laryngoscope Size: Miller and 2 Grade View: Grade II Tube type: Oral Tube size: 8.0 mm Number of attempts: 1 Airway Equipment and Method: Stylet Placement Confirmation: ETT inserted through vocal cords under direct vision and breath sounds checked- equal and bilateral Secured at: 22 cm Tube secured with: Tape Dental Injury: Teeth and Oropharynx as per pre-operative assessment

## 2012-02-20 NOTE — Progress Notes (Signed)
  Echocardiogram Echocardiogram Transesophageal has been performed.  [name] 02/20/2012, 8:49 AM

## 2012-02-20 NOTE — Progress Notes (Signed)
Patient was weaned performed SBT, coached on deep breathing, cough and was extubated to a 4 L nasal cannula.  No evidence of stridor present.  VC 550, NIF - 28

## 2012-02-20 NOTE — Progress Notes (Signed)
Flushed [name]'s foley catheter after several hours of low UOP 20-35cc/h suddenly after having hours of >150cc/h. VS including the CI/CO are unchanged and in normal limits. Catheter appears patent with return of the flush given. 500cc albumin given, lab values on 6 hr draw reviewed and appear unchanged. Only noted change in patient's care was the D/C of Dopamine infusion approx 1hr before her sudden drop in UOP. Will place the Dopamine back at 39mcg/kg/min and continue to monitor urine output.

## 2012-02-20 NOTE — Preoperative (Signed)
Beta Blockers   Reason not to administer Beta Blockers:Not Applicable 

## 2012-02-20 NOTE — Procedures (Signed)
Extubation Procedure Note  Patient Details:   Name: [name] DOB: [birth year] MRN: [id number]   Airway Documentation:     Evaluation  O2 sats: stable throughout Complications: No apparent complications Patient did tolerate procedure well. Bilateral Breath Sounds: Rhonchi;Expiratory wheezes (Coarse) Suctioning: Oral;Airway Yes  Filbert Schilder 02/20/2012, 9:10 PM

## 2012-02-20 NOTE — OR Nursing (Signed)
2300 2nd call

## 2012-02-20 NOTE — Plan of Care (Signed)
Problem: Phase II Progression Outcomes Goal: Patient extubated within - Outcome: Completed/Met Date Met:  02/20/12 Required 3 attempts at weaning patient from ventilator with much difficulty d/t her extreme anxiety

## 2012-02-20 NOTE — Progress Notes (Signed)
Patient is not able to pass SBT.  Patient became anxious and her RR remained in the mid 30s.  Patient is not consistenly following commands.  Placed patient back on full support.  RT will continue to monitor.

## 2012-02-20 NOTE — Transfer of Care (Signed)
Immediate Anesthesia Transfer of Care Note  Patient: [name]  Procedure(s) Performed: Procedure(s) (LRB): AORTIC VALVE REPLACEMENT (AVR) (N/A)  Patient Location: SICU  Anesthesia Type: General  Level of Consciousness: sedated and unresponsive  Airway & Oxygen Therapy: Patient remains intubated per anesthesia plan  Post-op Assessment: Post -op Vital signs reviewed and stable  Post vital signs: Reviewed and stable  Complications: No apparent anesthesia complications

## 2012-02-20 NOTE — Anesthesia Preprocedure Evaluation (Addendum)
Anesthesia Evaluation  Patient identified by MRN, date of birth, ID band Patient awake    Reviewed: Allergy & Precautions, H&P , NPO status , Patient's Chart, lab work & pertinent test results, reviewed documented beta blocker date and time   History of Anesthesia Complications (+) PONV  Airway Mallampati: III TM Distance: <3 FB Neck ROM: Full  Mouth opening: Limited Mouth Opening  Dental  (+) Edentulous Upper and Edentulous Lower   Pulmonary shortness of breath, with exertion, at rest and lying, Current Smoker,  breath sounds clear to auscultation  Pulmonary exam normal       Cardiovascular hypertension, Pt. on medications + CAD (mild non-obstructive ASCADz by cath) and +CHF + Valvular Problems/Murmurs (ECHO 4/13  Mild LVH, EF >55%, Severe AS with AVA 0.7cm2, mean grad 56mm Hg, peak grad 97mm Hg) AS Rhythm:Regular Rate:Normal     Neuro/Psych Seizures -, Well Controlled,  PSYCHIATRIC DISORDERS Depression    GI/Hepatic Neg liver ROS, PUD, GERD-  Controlled and Medicated,  Endo/Other  Hypothyroidism   Renal/GU negative Renal ROS     Musculoskeletal  (+) Arthritis -, Osteoarthritis,    Abdominal   Peds  Hematology negative hematology ROS (+)   Anesthesia Other Findings   Reproductive/Obstetrics                        Anesthesia Physical Anesthesia Plan  ASA: III  Anesthesia Plan: General   Post-op Pain Management:    Induction: Intravenous  Airway Management Planned: Oral ETT  Additional Equipment: Arterial line, PA Cath, Ultrasound Guidance Line Placement and TEE  Intra-op Plan:   Post-operative Plan: Post-operative intubation/ventilation  Informed Consent: I have reviewed the patients History and Physical, chart, labs and discussed the procedure including the risks, benefits and alternatives for the proposed anesthesia with the patient or authorized representative who has indicated  his/her understanding and acceptance.     Plan Discussed with: Anesthesiologist, Surgeon and CRNA  Anesthesia Plan Comments: (Plan routine monitors, A line, PA cath, GETA with TEE, post op ventilation  )      Anesthesia Quick Evaluation

## 2012-02-20 NOTE — Op Note (Signed)
NAMECHONITA, GADEA               ACCOUNT NO.:  [id number]  MEDICAL RECORD NO.:  [id number]  LOCATION:  2310                         FACILITY:  [facility]  PHYSICIAN:  Salvatore Decent. Dorris Fetch, M.D.DATE OF BIRTH:  April 05, 1956  DATE OF PROCEDURE:  02/20/2012 DATE OF DISCHARGE:                              OPERATIVE REPORT   PREOPERATIVE DIAGNOSIS:  Severe symptomatic aortic stenosis.  POSTOPERATIVE DIAGNOSIS:  Severe symptomatic aortic stenosis.  PROCEDURE:  Median sternotomy, extracorporeal circulation, aortic valve replacement with 23 mm [facility] Ease pericardial valve (size 19 mm, model #3300TFX, serial #[id number]).  SURGEONS:  Salvatore Decent. Dorris Fetch, M.D.  ASSISTANT:  Rowe Clack, PA-C.  ANESTHESIA:  General.  FINDINGS:  Prebypass TEE showed calcific bicuspid aortic valve with estimated valve area of 0.6 cm squared, heavily calcified valve leaflets, intraoperative finding of fusion of the left and right cusps with heavy calcification in the leaflets, moderate annular calcification.  Postbypass TEE showed good function of the prosthetic valve with no perivalvular leaks.  There was preserved left ventricular systolic function.  There was left ventricular hypertrophy.  CLINICAL INDICATION:  Mrs. Kosta is a 56 year old woman with a known bicuspid aortic valve and known aortic stenosis.  She had been advised to have aortic valve replacement over a year previously.  She had previously been seen in [facility] and an aortic valve replacement was recommended.  She would not commit to that at that time.  She then saw me 6 months later and again was advised to have aortic valve replacement but refused at that time as well.  She now returned about a year later with worsening chest pain and shortness of breath.  Her echocardiogram showed a mean gradient of 56 and a peak gradient 97, valve area of 0.7 cm squared.  She was once again advised to have aortic valve replacement and at this  time, she consented to proceed.  I did discuss in detail with her and her family the indications, risks, benefits, and alternatives.  She understood the risks included but were not limited to death, stroke, MI, DVT, PE, bleeding, possible need for transfusion, infection, other organ system dysfunction such as respiratory, renal, or gastrointestinal complications, incomplete heart block requiring permanent pacemaker placement.  She understood and accepted these risks and agreed to proceed.  I do not believe that she would be a candidate for lifelong anticoagulation with Coumadin and I recommended a tissue valve.  She accepted the recommendation and does understand there is a risk of valve failure and need for re-replacement in the future.  OPERATIVE NOTE:  Mrs. Coronado was brought to the preoperative holding area on Feb 20, 2012. There, the Anesthesia Service placed a Swan-Ganz catheter and arterial blood pressure monitoring line.  She was taken to the operating room, anesthetized, and intubated.  Transesophageal echocardiography was performed.  Please refer to Dr. Alroy Dust separately dictated note for details.  She was noted to have left ventricular hypertrophy, but preserved left ventricular systolic function.  There was severe aortic stenosis with a high gradient and calculated valve area is in 0.6-0.7 cm squared range.  There was no significant mitral valve or tricuspid valvular pathology.  The chest, abdomen,  and legs were prepped and draped in usual sterile fashion. A median sternotomy was performed.  Hemostasis was achieved.  The pericardium was opened.  The patient was heparinized.  After confirming adequate anticoagulation with ACT measurement, the aorta was cannulated via concentric 2-0 Ethibond pledgeted pursestring sutures.  A dual-stage venous cannula was placed via pursestring suture in the right atrial appendage.  Cardiopulmonary bypass was instituted and the  patient was cooled to 32 degrees Celsius.  A left ventricular vent was placed via pursestring suture in the right superior pulmonary vein.  The retrograde cardioplegic cannula was placed in the coronary sinus via pursestring suture in the right atrium.  An antegrade cardioplegic cannula was placed in the ascending aorta.  The aorta was crossclamped.  The left ventricle was emptied via both vents.  Cardiac arrest then was achieved with combination of cold antegrade and retrograde blood cardioplegia and topical iced saline.  An initial 750 mL of cardioplegia was administered antegrade.  There was a rapid diastolic arrest.  An additional 500 mL of cardioplegia was administered retrograde.  There was additional septal cooling down to 10 degrees Celsius.  Additional cardioplegia was administered at 20 minutes intervals during the valve replacement portion of the procedure.  An aortotomy was performed.  This was extended into the noncoronary sinus of Valsalva.  The valve leaflets were inspected.  They were heavily calcified.  Particularly, the fused left-right common cusp, was extremely heavily calcified.  There was only moderate annular calcification valve leaflets were excised.  The annular calcium was debrided taking care to control all calcific debris.  After debriding the anulus, it was copiously irrigated with iced saline.  The anulus sized for a 19 mm Magna Ease pericardial valve.  It was felt to be sufficient given the patient's BSA was 1.55.  2-0 Ethibond horizontal mattress sutures with subannular pledgets were placed circumferentially around the anulus, 12 sutures were utilized, and all the sutures then were brought through the sewing ring of the valve.  The valve was lowered into place and the sutures were sequentially tied, periodically spreading the valve leaflets to ensure that the valve was well seated.  After completing tying the sutures, the anulus was probed with a  fine-tip right-angle clamp and no gaps were noted.  The coronary ostia were inspected and there was no impingement on them.  Rewarming was begun.  The aortotomy was closed in 2 layers with a running 4-0 Prolene horizontal mattress suture, followed by running 4-0 Prolene simple suture.  Initial de-airing was performed at the completion of the first layer before tying the suture.  It should be noted that carbon dioxide was insufflated into the operative field during the operative procedure.  After closing the second layer of the aortotomy, the aortic crossclamp was removed.  The total crossclamp time was 66 minutes.  The patient required a single defibrillation with 10 joules and then was in sinus rhythm thereafter.  Epicardial pacing wires were placed on the right ventricle and right atrium, and DDD pacing was initiated as the patient was bradycardic initially.  It should be noted that dopamine was initiated at 3 mcg per kg per minute early in the bypass due to relatively low urine output.  After the patient had rewarmed to a core temperature of 37 degrees Celsius with her still in a steep Trendelenburg position,the left ventricular vent was removed as was the retrograde cardioplegic cannula. An IV needle was placed into the apex of the heart to complete the final  de-airing.  The patient then weaned from cardiopulmonary bypass on the first attempt.  She was DDD paced at 90 beats per minute and on dopamine at 3 mcg per kg per minute at the time separation from bypass.  The initial cardiac index was greater than 2 L per minute per meter squared and the patient remained hemodynamically stable throughout the post- bypass period.  A test dose of protamine was administered and was well tolerated.  The atrial and aortic cannulae were removed.  Remainder of the protamine was administered without incident.  The chest was irrigated with warm saline.  Hemostasis was achieved.  A single  mediastinal chest tube was placed through separate subcostal incision and secured with a #1 silk suture.  The pericardium was closed with interrupted 3-0 silk sutures. They came together easily without tension.  The sternum was closed with combination of single and double heavy-gauge stainless steel wires.  The pectoralis fascia, subcutaneous tissue, and skin were closed in standard fashion.  All sponge, needle, instrument counts were correct at the end of the procedure.  The patient was taken from the operating room to the Surgical Intensive Care Unit in good condition.     Salvatore Decent Dorris Fetch, M.D.     SCH/MEDQ  D:  02/20/2012  T:  02/20/2012  Job:  [id number]

## 2012-02-20 NOTE — Progress Notes (Signed)
Patient ID: [name], female   DOB: 20-Dec-1955, 56 y.o.   MRN: [id number]                   [address]            [name] [address]          [phone]     Day of Surgery Procedure(s) (LRB): AORTIC VALVE REPLACEMENT (AVR) (N/A)  Total Length of Stay:  LOS: 0 days  BP 104/62  Pulse 90  Temp(Src) 97.5 F (36.4 C) (Oral)  Resp 12  Wt 129 lb (58.514 kg)  SpO2 99%     . sodium chloride 20 mL/hr at 02/20/12 1225  . sodium chloride 20 mL/hr at 02/20/12 1237  . sodium chloride    . dexmedetomidine (PRECEDEX) IV infusion Stopped (02/20/12 1415)  . DOPamine 1 mcg/kg/min (02/20/12 1610)  . insulin (NOVOLIN-R) infusion Stopped (02/20/12 1500)  . lactated ringers 20 mL/hr at 02/20/12 1240  . nitroGLYCERIN Stopped (02/20/12 1240)  . phenylephrine (NEO-SYNEPHRINE) Adult infusion Stopped (02/20/12 1610)     Lab Results  Component Value Date   WBC 7.8 02/20/2012   HGB 8.9* 02/20/2012   HCT 26.5* 02/20/2012   PLT 165 02/20/2012   GLUCOSE 104* 02/20/2012   ALT 12 02/16/2012   AST 22 02/16/2012   NA 143 02/20/2012   K 3.5 02/20/2012   CL 104 02/16/2012   CREATININE 1.23* 02/16/2012   BUN 17 02/16/2012   CO2 18* 02/16/2012   TSH 0.276* 06/08/2010   INR 1.82* 02/20/2012   HGBA1C 5.4 02/16/2012   Early post op Not bleeding Opens eyes but not fully awake yet  Delight Ovens MD  Beeper [phone] Office [phone] 02/20/2012 4:45 PM

## 2012-02-20 NOTE — Progress Notes (Signed)
Notified Dr. Dorris Fetch of pt. Refusing to have ABG drawn while awake. Stated to send her to holding. Michelle Salvage, charge nurse , stated she will  Inform the anesthesiologist.

## 2012-02-20 NOTE — OR Nursing (Signed)
Called volunteer and 2300 1st call.

## 2012-02-20 NOTE — H&P (Signed)
PCP is Carmin Richmond, MD, MD  Referring Provider is Julien Nordmann, MD  Chief Complaint   Patient presents with   .  Follow-up     aortic stenosis...re-evaluate for surgery.Marland KitchenMarland KitchenREFERRED BY DR. Fayrene Fearing DAVIS OF [facility]. SHE HAS ALSO SEEN DR. Julien Nordmann .   HPI: 56 year old woman with known aortic stenosis, who presents with a chief complaint of shortness of breath and worsening chest pain. Mrs. Drenning is sent known aortic stenosis which has been severe in symptomatic since 2011. She initially had had been evaluated for surgery in [facility] by Dr. Marrian Salvage and Dr. Remo Lipps. She has severe aortic stenosis with a mean gradient of 36 mmHg and a peak gradient of 57 mm mercury. Aortic valve replacement was recommended but she would not commit to having surgery. About 6 months later in October I saw her for the same problem and once again emphasized the importance of aortic valve replacement for critical aortic stenosis. I was able to talk her into surgery. However on multiple occasions she was scheduled and call them shortly before the procedure to cancel the procedure. The final contact was in November of 2011 after canceling surgery due to gastrointestinal symptoms I asked her to call the office when she felt she was ready for surgery but had not heard back from her since then.  At the end of January of this year she presented with worsening chest pain and shortness of breath  regional. Echocardiogram showed a mean gradient of 56 mmHg and a peak gradient of 97 mmHg and a valve area calculated at 0.7 cm2. She saw Dr. Julien Nordmann in followup and was referred back to [place]. She states that she's not had any more chest pain since the admission. She does have shortness of breath with even minimal exertion. She states that she's had a tremor since the admission that has not resolved. He continues to have a great deal of family stress, homeschooling her daughters children and caring for her  mother with Alzheimer's.  Past Medical History   Diagnosis  Date   .  Smoking history    .  Aortic valve stenosis    .  Back pain, chronic    .  Chest pain    .  SOB (shortness of breath)    .  Coronary artery disease      mild; non-hemodynamically significant   .  CHF (congestive heart failure)    .  Dyslipidemia    .  Thyroid disease      hypothyroidism   .  Barrett esophagus    .  GERD (gastroesophageal reflux disease)      with PUD   .  PUD (peptic ulcer disease)    .  Depression    .  Seizures      as a child at age 57   .  Deaf    .  Sinus congestion    .  Wheezing     Past Surgical History   Procedure  Date   .  Cardiac catheterization  01/2010    Family History   Problem  Relation  Age of Onset   .  Heart attack  Father    .  Heart disease  Neg Hx       heart valve problems in other family members   Social History  History   Substance Use Topics   .  Smoking status:  Current Everyday Smoker -- 1.0 packs/day for 30 years  Types:  Cigarettes   .  Smokeless tobacco:  Not on file   .  Alcohol Use:  No    Current Outpatient Prescriptions   Medication  Sig  Dispense  Refill   .  aspirin 81 MG tablet  Take 325 mg by mouth daily.     Marland Kitchen  levothyroxine (SYNTHROID, LEVOTHROID) 50 MCG tablet  Take 50 mcg by mouth daily.     Marland Kitchen  omeprazole (PRILOSEC) 20 MG capsule  Take 20 mg by mouth daily.     Marland Kitchen  albuterol-ipratropium (COMBIVENT) 18-103 MCG/ACT inhaler  Inhale 2 puffs into the lungs every 6 (six) hours as needed.     .  tiotropium (SPIRIVA) 18 MCG inhalation capsule  Place 18 mcg into inhaler and inhale daily.     No Known Allergies  Review of Systems  Constitutional: Positive for activity change and fatigue. Negative for fever, chills and appetite change.  HENT: Positive for hearing loss, ear pain and congestion.  Ear "blockage"  Hard of hearing  Eyes: Negative.  Respiratory: Positive for cough, shortness of breath and wheezing.  Cardiovascular: Positive for  chest pain.  Heart murmur  orthopnea  Gastrointestinal:  GI bleed 6 months ago  Reflux  Genitourinary: Negative.  Musculoskeletal: Positive for back pain.  Hip pain R > L  Neurological: Positive for tremors and light-headedness. Negative for dizziness and syncope.  Psychiatric/Behavioral: Positive for dysphoric mood.  Nervous  All other systems reviewed and are negative.  BP 134/97  Pulse 105  Resp 20  Ht 5\' 1"  (1.549 m)  Wt 135 lb (61.236 kg)  BMI 25.51 kg/m2  SpO2 97%  Physical Exam  Vitals reviewed.  Constitutional: She is oriented to person, place, and time. She appears well-developed and well-nourished.  HENT:  edentulous  Eyes: EOM are normal. Pupils are equal, round, and reactive to light.  Neck: Neck supple. No JVD present. No thyromegaly present.  Cardiovascular: Normal rate and regular rhythm.  Murmur (3/6 crescendo/ decrescendo) heard. Unable to palpate DP/PT  Pulmonary/Chest: Effort normal and breath sounds normal.  Abdominal: Soft. There is no tenderness.  Musculoskeletal: She exhibits no edema.  Lymphadenopathy:  She has no cervical adenopathy.  Neurological: She is alert and oriented to person, place, and time.  Skin: Skin is warm and dry.  Psychiatric:  Anxious, unable to stay focused  Diagnostic Tests:  ECHO report and images reviewed  Impression:  56 year old woman with critical aortic stenosis which is symptomatic. She needs an aortic valve replacement. Given her recent GI bleed as well as concerned about reliability, I do not think she is a candidate for a mechanical valve. It would definitely benefit from a pericardial valve. She is aware that there is risk of failure with pericardial valve, but generally speaking they have good longevity.  I had a long discussion with her regarding the natural history of aortic stenosis, the need for aortic valve replacement, the nature of the surgery, need for general anesthesia, incision to be used, expected hospital  stay and expected overall recovery. We also discussed typical short and long-term outcomes This was a difficult conversation as she had trouble staying focused on the topic, and often redirected the conversation to other issues not germane to the discussion. Fortunately her daughter was present to assist with the patient's understanding of the issues involved. I was quite clear netting to her that her life expectancy was very limited she underwent aortic valve replacement, and that while there were no guarantees her odds  with aortic valve replacement were excellent.  I did discuss with her the indications, risks, benefits, and alternatives. She They understand the risks include but are not limited to death, stroke, MI, DVT/PE, bleeding, possible need for transfusion, infections, other organ system dysfunction including respiratory, renal, or GI complications. They understands and accepts these risks and says she will proceed.  Prior to aortic valve replacement she needs a repeat cardiac catheterization. His been about 2 years and she was cathed. She did have some moderate coronary disease on her previous catheterization. It was not hemodynamically significant at that time but could have progressed in the interim. I attempted to call Dr. Mariah Milling. He is not in the office today but I left a message for him to call me tomorrow regarding this issue.  Plan:  Aortic valve replacement with pericardial valve after she has had preoperative catheterization.   HPI:  Hebel returns today to further discuss her aortic stenosis. In the interim since her last visit she's undergone cardiac catheterization. It showed no evidence of clinically significant coronary stenosis. She states that also in the interim since her last visit she developed a severe rash on the palms of her hands. She was treated with an antibiotic cream initially analysis putting lotion on them. She complains of difficulty making a fist of the right hand  because of swelling in the hand. This did not affect the dorsum of her hand or arms or the soles of her feet. She continues to have shortness of breath. She is very anxious about her aortic stenosis and her surgery. She was started on a "nerve pill" by Dr. Earlene Plater. She says this is not working particularly well. She says she was told that once a day but has been taking it 4 times a day. She is unable to tell me the name of the medication.  Past Medical History   Diagnosis  Date   .  Smoking history    .  Aortic valve stenosis    .  Back pain, chronic    .  Chest pain    .  SOB (shortness of breath)    .  Coronary artery disease      mild; non-hemodynamically significant   .  CHF (congestive heart failure)    .  Dyslipidemia    .  Thyroid disease      hypothyroidism   .  Barrett esophagus    .  GERD (gastroesophageal reflux disease)      with PUD   .  PUD (peptic ulcer disease)    .  Depression    .  Seizures      as a child at age 38   .  Deaf    .  Sinus congestion    .  Wheezing    .  Rash, skin      2 weeks ago, bilateral hand rash, peeling, eruptions    Current Outpatient Prescriptions   Medication  Sig  Dispense  Refill   .  aspirin 325 MG tablet  Take 325 mg by mouth daily.     Marland Kitchen  levothyroxine (SYNTHROID, LEVOTHROID) 50 MCG tablet  Take 50 mcg by mouth daily.     .  metoprolol tartrate (LOPRESSOR) 50 MG tablet  Take 1 tablet (50 mg total) by mouth 2 (two) times daily.  60 tablet  11   .  omeprazole (PRILOSEC) 20 MG capsule  Take 20 mg by mouth daily.      Physical Exam:  BP 165/90  Pulse 84  Resp 24  Ht 5' (1.524 m)  Wt 130 lb (58.968 kg)  BMI 25.39 kg/m2  SpO72 90%  56 year old woman appearing much older than her stated age.  Gen. 56 year old woman in no acute distress, very anxious  Cardiac regular, high-pitched systolic murmur heard throughout the precordium  Lungs clear  Mild erythema and induration of both hands  Diagnostic Tests:  Cardiac catheterization  report reviewed, key finding no significant coronary disease  Impression:  56 year old woman with severe aortic stenosis which is symptomatic. I once again reviewed with her the necessity for aortic valve replacement given the natural history of aortic stenosis she likely has less than a year to live without aortic valve replacement. I discussed with her the general nature of the operation, need for general anesthesia, and expected hospital stay and overall recovery. Indications, risks, benefits, and alternatives. She understands the risk include but are not limited to death, stroke, bleeding, possible need for transfusion, infection, complete heart block requiring permanent pacemaker placement, and other organ system dysfunction including respiratory, renal, or GI complications.  I do not think she is a candidate for lifelong anticoagulation with Coumadin. Therefore recommended to her that we proceed with a tissue valve  Plan:  Aortic valve replacement with a tissue valve on Monday, May 20  5/20 No interval change

## 2012-02-20 NOTE — Progress Notes (Signed)
PT began weaning from vent at 1730. Patient returned to full support at 1708 due to inc'd RR, inc'd BP, and aggitation.

## 2012-02-21 ENCOUNTER — Encounter (HOSPITAL_COMMUNITY): Payer: Self-pay | Admitting: Thoracic Surgery (Cardiothoracic Vascular Surgery)

## 2012-02-21 ENCOUNTER — Inpatient Hospital Stay (HOSPITAL_COMMUNITY): Payer: Medicaid Other

## 2012-02-21 ENCOUNTER — Encounter: Payer: Self-pay | Admitting: Thoracic Surgery (Cardiothoracic Vascular Surgery)

## 2012-02-21 DIAGNOSIS — G459 Transient cerebral ischemic attack, unspecified: Secondary | ICD-10-CM

## 2012-02-21 DIAGNOSIS — R197 Diarrhea, unspecified: Secondary | ICD-10-CM

## 2012-02-21 DIAGNOSIS — R4789 Other speech disturbances: Secondary | ICD-10-CM

## 2012-02-21 LAB — POCT I-STAT 3, ART BLOOD GAS (G3+)
Acid-base deficit: 5 mmol/L — ABNORMAL HIGH (ref 0.0–2.0)
Acid-base deficit: 4 mmol/L — ABNORMAL HIGH (ref 0.0–2.0)
Bicarbonate: 19.9 mEq/L — ABNORMAL LOW (ref 20.0–24.0)
O2 Saturation: 97 %
O2 Saturation: 95 %
O2 Saturation: 92 %
pCO2 arterial: 38.2 mmHg (ref 35.0–45.0)
pCO2 arterial: 38.9 mmHg (ref 35.0–45.0)
pCO2 arterial: 36.3 mmHg (ref 35.0–45.0)
pO2, Arterial: 68 mmHg — ABNORMAL LOW (ref 80.0–100.0)

## 2012-02-21 LAB — COMPREHENSIVE METABOLIC PANEL
BUN: 20 mg/dL (ref 6–23)
CO2: 19 mEq/L (ref 19–32)
Calcium: 8.6 mg/dL (ref 8.4–10.5)
Chloride: 104 mEq/L (ref 96–112)
Creatinine, Ser: 1.06 mg/dL (ref 0.50–1.10)
GFR calc Af Amer: 67 mL/min — ABNORMAL LOW (ref >90)
GFR calc non Af Amer: 58 mL/min — ABNORMAL LOW (ref >90)
Total Bilirubin: 0.3 mg/dL (ref 0.3–1.2)

## 2012-02-21 LAB — MAGNESIUM
Magnesium: 2.7 mg/dL — ABNORMAL HIGH (ref 1.5–2.5)
Magnesium: 2.4 mg/dL (ref 1.5–2.5)

## 2012-02-21 LAB — GLUCOSE, CAPILLARY
Glucose-Capillary: 96 mg/dL (ref 70–99)
Glucose-Capillary: 105 mg/dL — ABNORMAL HIGH (ref 70–99)
Glucose-Capillary: 87 mg/dL (ref 70–99)

## 2012-02-21 LAB — CBC
HCT: 27.2 % — ABNORMAL LOW (ref 36.0–46.0)
Hemoglobin: 9 g/dL — ABNORMAL LOW (ref 12.0–15.0)
MCH: 29.6 pg (ref 26.0–34.0)
MCH: 29.4 pg (ref 26.0–34.0)
MCHC: 33.5 g/dL (ref 30.0–36.0)
MCHC: 33.1 g/dL (ref 30.0–36.0)
Platelets: 156 10*3/uL (ref 150–400)
RBC: 3.06 MIL/uL — ABNORMAL LOW (ref 3.87–5.11)
RDW: 15.2 % (ref 11.5–15.5)

## 2012-02-21 LAB — BASIC METABOLIC PANEL
Calcium: 7.6 mg/dL — ABNORMAL LOW (ref 8.4–10.5)
GFR calc non Af Amer: >90 mL/min (ref >90)
Sodium: 135 mEq/L (ref 135–145)

## 2012-02-21 MED ORDER — ASPIRIN 300 MG RE SUPP
300.0000 mg | Freq: Every day | RECTAL | Status: DC
  Administered 2012-02-21 – 2012-02-22 (×2): 300 mg via RECTAL
  Filled 2012-02-21 (×3): qty 1

## 2012-02-21 MED ORDER — INSULIN ASPART 100 UNIT/ML ~~LOC~~ SOLN: 0.0000 [IU] | SUBCUTANEOUS | Status: DC

## 2012-02-21 MED ORDER — HALOPERIDOL LACTATE 5 MG/ML IJ SOLN
2.0000 mg | Freq: Once | INTRAMUSCULAR | Status: AC
  Administered 2012-02-21: 2 mg via INTRAVENOUS

## 2012-02-21 MED ORDER — FUROSEMIDE 10 MG/ML IJ SOLN
40.0000 mg | Freq: Once | INTRAMUSCULAR | Status: AC
  Administered 2012-02-21: 40 mg via INTRAVENOUS
  Filled 2012-02-21: qty 4

## 2012-02-21 MED ORDER — ACETAMINOPHEN 10 MG/ML IV SOLN
1000.0000 mg | Freq: Four times a day (QID) | INTRAVENOUS | Status: AC
  Administered 2012-02-21 – 2012-02-22 (×4): 1000 mg via INTRAVENOUS
  Filled 2012-02-21 (×7): qty 100

## 2012-02-21 MED ORDER — HALOPERIDOL LACTATE 5 MG/ML IJ SOLN
INTRAMUSCULAR | Status: AC
  Filled 2012-02-21: qty 1

## 2012-02-21 MED ORDER — BIOTENE DRY MOUTH MT LIQD
15.0000 mL | OROMUCOSAL | Status: DC
  Administered 2012-02-21 – 2012-02-23 (×12): 15 mL via OROMUCOSAL

## 2012-02-21 MED FILL — Lidocaine HCl IV Inj 20 MG/ML: INTRAVENOUS | Qty: 5 | Status: AC

## 2012-02-21 MED FILL — Magnesium Sulfate Inj 50%: INTRAMUSCULAR | Qty: 10 | Status: AC

## 2012-02-21 MED FILL — Sodium Bicarbonate IV Soln 8.4%: INTRAVENOUS | Qty: 50 | Status: AC

## 2012-02-21 MED FILL — Electrolyte-R (PH 7.4) Solution: INTRAVENOUS | Qty: 3000 | Status: AC

## 2012-02-21 MED FILL — Sodium Chloride IV Soln 0.9%: INTRAVENOUS | Qty: 1000 | Status: AC

## 2012-02-21 MED FILL — Mannitol IV Soln 20%: INTRAVENOUS | Qty: 500 | Status: AC

## 2012-02-21 MED FILL — Potassium Chloride Inj 2 mEq/ML: INTRAVENOUS | Qty: 20 | Status: AC

## 2012-02-21 MED FILL — Sodium Chloride Irrigation Soln 0.9%: Qty: 3000 | Status: AC

## 2012-02-21 MED FILL — Heparin Sodium (Porcine) Inj 1000 Unit/ML: INTRAMUSCULAR | Qty: 10 | Status: AC

## 2012-02-21 MED FILL — Heparin Sodium (Porcine) Inj 1000 Unit/ML: INTRAMUSCULAR | Qty: 30 | Status: AC

## 2012-02-21 NOTE — Progress Notes (Signed)
[facility] BRIEF SICU PROGRESS NOTE  1 Day Post-Op  S/P Procedure(s) (LRB): AORTIC VALVE REPLACEMENT (AVR) (N/A)   Sleeping but not responding to verbal stimuli Moves all 4 extremities but will not follow any commands Breathing comfortably on O2 4 L/min via [place], O2 sats 95-100% Sinus rhythm, BP stable 138/85 UOP adequate  Plan: Continue to monitor neuro status and watch respiratory status carefully  [name] H 02/21/2012 6:41 PM

## 2012-02-21 NOTE — Progress Notes (Signed)
Patient extremely fidgety, anxious when awake.  Pulling at tubes and equipment.  H&P states that patient is hard of hearing.  Does not follow commands appropriately.  Pupils reactive bilaterally and moves all extremities.  Nasal cannula increased to 6L and patient pulled up in bed with HOB > 30 degrees.  SO2 92% on 6L.  Bed alarm on.  Will continue to monitor patient closely.

## 2012-02-21 NOTE — Progress Notes (Signed)
Patient continues to flail in bed.  Pupils reactive, moving all extremities.  Patient's speech is clear when she curses.  Unable to effectively communicate with patient in current state.  Bed alarm remains on and sitter has been requested for am.  Will weigh patient in bed due to inability of patient to follow instructions.  Continue to monitor.

## 2012-02-21 NOTE — Progress Notes (Signed)
*  PRELIMINARY RESULTS* Vascular Ultrasound Carotid Duplex (Doppler) has been completed.  Preliminary findings: Bilaterally no evidence of significant ICA stenosis.   Farrel Demark , RDMS 02/21/2012, 4:54 PM
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
## 2012-02-21 NOTE — Anesthesia Postprocedure Evaluation (Signed)
  Anesthesia Post-op Note  Patient: [name]  Procedure(s) Performed: Procedure(s) (LRB): AORTIC VALVE REPLACEMENT (AVR) (N/A)  Patient Location: SICU  Anesthesia Type: General  Level of Consciousness: awake and sedated  Airway and Oxygen Therapy: Patient Spontanous Breathing  Post-op Pain: none  Post-op Assessment: Post-op Vital signs reviewed, Patient's Cardiovascular Status Stable and Respiratory Function Stable  Post-op Vital Signs: Reviewed and stable  Complications: No apparent anesthesia complications

## 2012-02-21 NOTE — Consult Note (Signed)
[facility] CONSULT NOTE     Reason for Consult: speech difficulty    HPI:    [name] is an 56 y.o. female who underwent a AO valve replacement on 02/20/12.  Postsurgical TEE showed "good function of the prosthetic valve with no perivalvular leaks. There was preserved left ventricular systolic function. There was left ventricular hypertrophy."  While in step-down the following day 02/21/12 patient was noted to be anxious and O2 saturation on 6L  to be 92%. At [facility] today patient noted to have clear speech when cursing but unable to follow instructions or converse with staff.    Over 24 hours H/H dropped from 9.5/28.4 to 8.8/26.3 K+ 5.8 MG has decreased from 3.2 to 2.7  ECG shows NSR  Currently A-line BP is 184/69, patient is restless in bed saying "da, da", following no commands and moving all extremities.   Past Medical History  Diagnosis Date  . Smoking history   . Aortic valve stenosis   . Back pain, chronic   . Chest pain   . SOB (shortness of breath)   . Coronary artery disease     mild; non-hemodynamically significant  . CHF (congestive heart failure)   . Dyslipidemia   . Thyroid disease     hypothyroidism  . Barrett esophagus   . GERD (gastroesophageal reflux disease)     with PUD  . PUD (peptic ulcer disease)   . Depression   . Deaf   . Sinus congestion   . Wheezing   . Rash, skin     2 weeks ago, bilateral hand rash, peeling, eruptions   . Seizures     as a child at age 53  . Hypothyroidism   . Hypertension   . Arthritis     back, hips, legs  . PONV (postoperative nausea and vomiting)     Past Surgical History  Procedure Date  . Cardiac catheterization 01/2010  . Cholecystectomy   . Cesarean section     x 2  . Vaginal hysterectomy   . Tonsillectomy and adenoidectomy     at age 18    Family History  Problem Relation Age of Onset  . Heart attack Father   . Heart disease Neg Hx     heart valve problems in  other family members  . Dementia Mother   . Hypertension Mother     Social History:  reports that she has been smoking Cigarettes.  She has a 30 pack-year smoking history. She does not have any smokeless tobacco history on file. She reports that she does not drink alcohol or use illicit drugs.  Allergies  Allergen Reactions  . Chlorine     Bleach, rash hands  . Other     CHG hand soap - rash    Medications:    Scheduled:   . acetaminophen (TYLENOL) oral liquid 160 mg/5 mL  650 mg Per Tube NOW   Or  . acetaminophen  650 mg Rectal NOW  . acetaminophen  1,000 mg Oral Q6H   Or  . acetaminophen (TYLENOL) oral liquid 160 mg/5 mL  975 mg Per Tube Q6H  . aspirin EC  325 mg Oral Daily   Or  . aspirin  324 mg Per Tube Daily  . bisacodyl  10 mg Oral Daily   Or  . bisacodyl  10 mg Rectal Daily  .  cefUROXime (ZINACEF)  IV  1.5 g Intravenous Q12H  . docusate sodium  200 mg Oral Daily  . famotidine (PEPCID) IV  20 mg Intravenous Q12H  . furosemide  40 mg Intravenous Once  . haloperidol lactate      . haloperidol lactate  2 mg Intravenous Once  . insulin aspart  0-24 Units Subcutaneous Q2H  . insulin aspart  0-24 Units Subcutaneous Q4H  . levothyroxine  50 mcg Oral QAC breakfast  . magnesium sulfate  4 g Intravenous Once  . metoprolol tartrate  12.5 mg Oral BID   Or  . metoprolol tartrate  12.5 mg Per Tube BID  . pantoprazole  40 mg Oral Q1200  . potassium chloride  10 mEq Intravenous Q1 Hr x 3  . potassium chloride  10 mEq Intravenous Q1 Hr x 3  . sodium chloride  3 mL Intravenous Q12H  . vancomycin  1,000 mg Intravenous Once  . DISCONTD: insulin aspart  0-24 Units Subcutaneous Q4H  . DISCONTD: insulin regular  0-10 Units Intravenous TID WC    Review of Systems - unable to attain  Blood pressure 128/69, pulse 79, temperature 99.1 F (37.3 C), temperature source Core (Comment), resp. rate 21, height 5' (1.524 m), weight 64.3 kg (141 lb 12.1 oz), SpO2 100.00%.   Neurologic  Examination:   Mental Status: Restless in bed, moving all extremities purposefully and spontaneously, continues to repeat "da, da".   Cranial Nerves: II-Visual fields shows blink to threat on left but not consistently on the right.  III/IV/VI-Extraocular movements intact as she will look to left and right. Pupils reactive bilaterally. V/VII-face appears symmetric XI-bilateral shoulder shrug XII-midline tongue  Motor: 5/5 bilaterally with continuous movement, agitated.  Increased tone (rigid) in left upper extremity. Sensory: Withdrew all 4 extremities to noxious stimuli  Deep Tendon Reflexes: 2+ and symmetric throughout. Plantars downgoing right and upgoing left.   No results found for this basename: cbc, bmp, coags, chol, tri, ldl, hga1c    Results for orders placed during the hospital encounter of 02/20/12 (from the past 48 hour(s))  POCT I-STAT 4, (NA,K, GLUC, HGB,HCT)     Status: Abnormal   Collection Time   02/20/12  8:11 AM      Component Value Range Comment   Sodium 140  135 - 145 (mEq/L)    Potassium 3.9  3.5 - 5.1 (mEq/L)    Glucose, Bld 102 (*) 70 - 99 (mg/dL)    HCT 16.1 (*) 09.6 - 46.0 (%)    Hemoglobin 11.9 (*) 12.0 - 15.0 (g/dL)   POCT I-STAT 4, (NA,K, GLUC, HGB,HCT)     Status: Abnormal   Collection Time   02/20/12  8:58 AM      Component Value Range Comment   Sodium 139  135 - 145 (mEq/L)    Potassium 3.3 (*) 3.5 - 5.1 (mEq/L)    Glucose, Bld 106 (*) 70 - 99 (mg/dL)    HCT 04.5 (*) 40.9 - 46.0 (%)    Hemoglobin 11.2 (*) 12.0 - 15.0 (g/dL)   POCT I-STAT 4, (NA,K, GLUC, HGB,HCT)     Status: Abnormal   Collection Time   02/20/12  9:17 AM      Component Value Range Comment   Sodium 137  135 - 145 (mEq/L)    Potassium 3.0 (*) 3.5 - 5.1 (mEq/L)    Glucose, Bld 90  70 - 99 (mg/dL)    HCT 81.1 (*) 91.4 - 46.0 (%)    Hemoglobin 7.8 (*)  12.0 - 15.0 (g/dL)   POCT I-STAT 3, BLOOD GAS (G3+)     Status: Abnormal   Collection Time   02/20/12  9:22 AM      Component Value  Range Comment   pH, Arterial 7.311 (*) 7.350 - 7.400     pCO2 arterial 37.9  35.0 - 45.0 (mmHg)    pO2, Arterial 359.0 (*) 80.0 - 100.0 (mmHg)    Bicarbonate 19.1 (*) 20.0 - 24.0 (mEq/L)    TCO2 20  0 - 100 (mmol/L)    O2 Saturation 100.0      Acid-base deficit 7.0 (*) 0.0 - 2.0 (mmol/L)    Sample type ARTERIAL     PLATELET COUNT     Status: Normal   Collection Time   02/20/12 10:13 AM      Component Value Range Comment   Platelets 194  150 - 400 (K/uL)   HEMOGLOBIN AND HEMATOCRIT, BLOOD     Status: Abnormal   Collection Time   02/20/12 10:13 AM      Component Value Range Comment   Hemoglobin 8.1 (*) 12.0 - 15.0 (g/dL)    HCT 40.9 (*) 81.1 - 46.0 (%)   POCT I-STAT 4, (NA,K, GLUC, HGB,HCT)     Status: Abnormal   Collection Time   02/20/12 10:17 AM      Component Value Range Comment   Sodium 140  135 - 145 (mEq/L)    Potassium 3.6  3.5 - 5.1 (mEq/L)    Glucose, Bld 106 (*) 70 - 99 (mg/dL)    HCT 91.4 (*) 78.2 - 46.0 (%)    Hemoglobin 8.8 (*) 12.0 - 15.0 (g/dL)   POCT I-STAT 3, BLOOD GAS (G3+)     Status: Abnormal   Collection Time   02/20/12 10:22 AM      Component Value Range Comment   pH, Arterial 7.400  7.350 - 7.400     pCO2 arterial 35.2  35.0 - 45.0 (mmHg)    pO2, Arterial 372.0 (*) 80.0 - 100.0 (mmHg)    Bicarbonate 21.8  20.0 - 24.0 (mEq/L)    TCO2 23  0 - 100 (mmol/L)    O2 Saturation 100.0      Acid-base deficit 3.0 (*) 0.0 - 2.0 (mmol/L)    Sample type ARTERIAL     POCT I-STAT 4, (NA,K, GLUC, HGB,HCT)     Status: Abnormal   Collection Time   02/20/12 11:18 AM      Component Value Range Comment   Sodium 142  135 - 145 (mEq/L)    Potassium 3.4 (*) 3.5 - 5.1 (mEq/L)    Glucose, Bld 97  70 - 99 (mg/dL)    HCT 95.6 (*) 21.3 - 46.0 (%)    Hemoglobin 8.8 (*) 12.0 - 15.0 (g/dL)   POCT I-STAT 3, BLOOD GAS (G3+)     Status: Abnormal   Collection Time   02/20/12 11:21 AM      Component Value Range Comment   pH, Arterial 7.379  7.350 - 7.400     pCO2 arterial 35.4  35.0  - 45.0 (mmHg)    pO2, Arterial 146.0 (*) 80.0 - 100.0 (mmHg)    Bicarbonate 20.9  20.0 - 24.0 (mEq/L)    TCO2 22  0 - 100 (mmol/L)    O2 Saturation 99.0      Acid-base deficit 4.0 (*) 0.0 - 2.0 (mmol/L)    Sample type ARTERIAL     POCT I-STAT 3, BLOOD GAS (G3+)  Status: Abnormal   Collection Time   02/20/12 12:20 PM      Component Value Range Comment   pH, Arterial 7.383  7.350 - 7.400     pCO2 arterial 36.6  35.0 - 45.0 (mmHg)    pO2, Arterial 62.0 (*) 80.0 - 100.0 (mmHg)    Bicarbonate 22.1  20.0 - 24.0 (mEq/L)    TCO2 23  0 - 100 (mmol/L)    O2 Saturation 93.0      Acid-base deficit 3.0 (*) 0.0 - 2.0 (mmol/L)    Patient temperature 35.8 C      Collection site ARTERIAL LINE      Drawn by Operator      Sample type ARTERIAL     POCT I-STAT 4, (NA,K, GLUC, HGB,HCT)     Status: Abnormal   Collection Time   02/20/12 12:25 PM      Component Value Range Comment   Sodium 143  135 - 145 (mEq/L)    Potassium 3.5  3.5 - 5.1 (mEq/L)    Glucose, Bld 104 (*) 70 - 99 (mg/dL)    HCT 40.9 (*) 81.1 - 46.0 (%)    Hemoglobin 10.5 (*) 12.0 - 15.0 (g/dL)   CBC     Status: Abnormal   Collection Time   02/20/12 12:30 PM      Component Value Range Comment   WBC 7.8  4.0 - 10.5 (K/uL)    RBC 2.98 (*) 3.87 - 5.11 (MIL/uL)    Hemoglobin 8.9 (*) 12.0 - 15.0 (g/dL)    HCT 91.4 (*) 78.2 - 46.0 (%)    MCV 88.9  78.0 - 100.0 (fL)    MCH 29.9  26.0 - 34.0 (pg)    MCHC 33.6  30.0 - 36.0 (g/dL)    RDW 95.6  21.3 - 08.6 (%)    Platelets 165  150 - 400 (K/uL)   PROTIME-INR     Status: Abnormal   Collection Time   02/20/12 12:30 PM      Component Value Range Comment   Prothrombin Time 21.4 (*) 11.6 - 15.2 (seconds)    INR 1.82 (*) 0.00 - 1.49    APTT     Status: Abnormal   Collection Time   02/20/12 12:30 PM      Component Value Range Comment   aPTT 38 (*) 24 - 37 (seconds)   GLUCOSE, CAPILLARY     Status: Abnormal   Collection Time   02/20/12  1:30 PM      Component Value Range Comment    Glucose-Capillary 104 (*) 70 - 99 (mg/dL)   GLUCOSE, CAPILLARY     Status: Abnormal   Collection Time   02/20/12  2:33 PM      Component Value Range Comment   Glucose-Capillary 101 (*) 70 - 99 (mg/dL)   GLUCOSE, CAPILLARY     Status: Normal   Collection Time   02/20/12  3:58 PM      Component Value Range Comment   Glucose-Capillary 99  70 - 99 (mg/dL)   POCT I-STAT, CHEM 8     Status: Abnormal   Collection Time   02/20/12  6:24 PM      Component Value Range Comment   Sodium 139  135 - 145 (mEq/L)    Potassium 3.7  3.5 - 5.1 (mEq/L)    Chloride 114 (*) 96 - 112 (mEq/L)    BUN 13  6 - 23 (mg/dL)    Creatinine, Ser 5.78  0.50 -  1.10 (mg/dL)    Glucose, Bld 621 (*) 70 - 99 (mg/dL)    Calcium, Ion 3.08 (*) 1.12 - 1.32 (mmol/L)    TCO2 19  0 - 100 (mmol/L)    Hemoglobin 9.2 (*) 12.0 - 15.0 (g/dL)    HCT 65.7 (*) 84.6 - 46.0 (%)   CBC     Status: Abnormal   Collection Time   02/20/12  6:30 PM      Component Value Range Comment   WBC 4.8  4.0 - 10.5 (K/uL)    RBC 3.20 (*) 3.87 - 5.11 (MIL/uL)    Hemoglobin 9.5 (*) 12.0 - 15.0 (g/dL)    HCT 96.2 (*) 95.2 - 46.0 (%)    MCV 88.8  78.0 - 100.0 (fL)    MCH 29.7  26.0 - 34.0 (pg)    MCHC 33.5  30.0 - 36.0 (g/dL)    RDW 84.1  32.4 - 40.1 (%)    Platelets 152  150 - 400 (K/uL)   MAGNESIUM     Status: Abnormal   Collection Time   02/20/12  6:30 PM      Component Value Range Comment   Magnesium 3.2 (*) 1.5 - 2.5 (mg/dL)   CREATININE, SERUM     Status: Abnormal   Collection Time   02/20/12  6:30 PM      Component Value Range Comment   Creatinine, Ser 0.83  0.50 - 1.10 (mg/dL)    GFR calc non Af Amer 77 (*) >90 (mL/min)    GFR calc Af Amer 90 (*) >90 (mL/min)   GLUCOSE, CAPILLARY     Status: Abnormal   Collection Time   02/20/12  8:09 PM      Component Value Range Comment   Glucose-Capillary 148 (*) 70 - 99 (mg/dL)   GLUCOSE, CAPILLARY     Status: Abnormal   Collection Time   02/20/12 11:35 PM      Component Value Range Comment    Glucose-Capillary 117 (*) 70 - 99 (mg/dL)   CBC     Status: Abnormal   Collection Time   02/21/12  3:31 AM      Component Value Range Comment   WBC 5.5  4.0 - 10.5 (K/uL)    RBC 2.97 (*) 3.87 - 5.11 (MIL/uL)    Hemoglobin 8.8 (*) 12.0 - 15.0 (g/dL)    HCT 02.7 (*) 25.3 - 46.0 (%)    MCV 88.6  78.0 - 100.0 (fL)    MCH 29.6  26.0 - 34.0 (pg)    MCHC 33.5  30.0 - 36.0 (g/dL)    RDW 66.4  40.3 - 47.4 (%)    Platelets 156  150 - 400 (K/uL)   BASIC METABOLIC PANEL     Status: Abnormal   Collection Time   02/21/12  3:31 AM      Component Value Range Comment   Sodium 135  135 - 145 (mEq/L)    Potassium 5.8 (*) 3.5 - 5.1 (mEq/L)    Chloride 108  96 - 112 (mEq/L)    CO2 18 (*) 19 - 32 (mEq/L)    Glucose, Bld 97  70 - 99 (mg/dL)    BUN 16  6 - 23 (mg/dL)    Creatinine, Ser 2.59  0.50 - 1.10 (mg/dL)    Calcium 7.6 (*) 8.4 - 10.5 (mg/dL)    GFR calc non Af Amer >90  >90 (mL/min)    GFR calc Af Amer >90  >90 (mL/min)   MAGNESIUM  Status: Abnormal   Collection Time   02/21/12  3:31 AM      Component Value Range Comment   Magnesium 2.7 (*) 1.5 - 2.5 (mg/dL)   GLUCOSE, CAPILLARY     Status: Abnormal   Collection Time   02/21/12  3:38 AM      Component Value Range Comment   Glucose-Capillary 104 (*) 70 - 99 (mg/dL)   GLUCOSE, CAPILLARY     Status: Normal   Collection Time   02/21/12  7:34 AM      Component Value Range Comment   Glucose-Capillary 96  70 - 99 (mg/dL)    Comment 1 Documented in Chart      Comment 2 Notify RN     POCT I-STAT 3, BLOOD GAS (G3+)     Status: Abnormal   Collection Time   02/21/12  8:41 AM      Component Value Range Comment   pH, Arterial 7.347 (*) 7.350 - 7.400     pCO2 arterial 36.3  35.0 - 45.0 (mmHg)    pO2, Arterial 68.0 (*) 80.0 - 100.0 (mmHg)    Bicarbonate 19.9 (*) 20.0 - 24.0 (mEq/L)    TCO2 21  0 - 100 (mmol/L)    O2 Saturation 92.0      Acid-base deficit 5.0 (*) 0.0 - 2.0 (mmol/L)    Patient temperature 37.2 C      Collection site ARTERIAL LINE       Drawn by Operator      Sample type ARTERIAL       Dg Chest Portable 1 View In Am  02/21/2012  *RADIOLOGY REPORT*  Clinical Data: Post aortic valve replacement  PORTABLE CHEST - 1 VIEW  Comparison: Portable exam 0619 hours compared to 02/20/2012  Findings: Endotracheal nasogastric tubes removed. Right jugular Swan-Ganz catheter with tip projecting over right pulmonary artery. Mediastinal drain remains. Mild enlargement of cardiac silhouette post AVR. Atherosclerotic calcification aortic arch. Pulmonary vascular congestion. Bibasilar atelectasis greater on left, slightly improved. No definite pulmonary edema, significant pleural effusion, or pneumothorax Epicardial pacing wires noted.  IMPRESSION: Bibasilar atelectasis, slightly improved.  Original Report Authenticated By: Lollie Marrow, M.D.   Dg Chest Portable 1 View  02/20/2012  *RADIOLOGY REPORT*  Clinical Data: Status post heart surgery.  PORTABLE CHEST - 1 VIEW  Comparison: 02/16/2012.  Findings: The cardiac silhouette is borderline enlarged.  Interval median sternotomy wires and prosthetic heart valve.  Epicardial pacer leads are in place.  Nasogastric tube extending into the stomach with its side hole in the proximal stomach.  Endotracheal tube tip 3 cm above the carina.  Right jugular Swan-Ganz catheter tip in the lateral aspect of the right lower lung zone.  Interval mild bibasilar atelectasis and small pneumomediastinum.  No pneumothorax is seen.  IMPRESSION:  1.  Small postoperative pneumomediastinum without pneumothorax. 2.  Mild bibasilar atelectasis.  Original Report Authenticated By: Darrol Angel, M.D.     Assessment/Plan:   56 YO  Female S/P AO valve replacement post op day #1, with clinical exam findings most consistent with a delirium. Overall clinical picture not strongly suggestive of stroke, however, cannot rule out bilateral parietal, bilateral thalamic or bilateral medial temporal lobe strokes, which could present with  encephalopathy in the absence of clearly defined focal weakness. Given recent cardiothoracic surgery, she would be at increased risk for cardio embolic or atherothrombotic stroke. Must also consider encephalopathy as she is hyperkalemic and hypermagnesemic. Consider alcohol or benzodiazepine withdrawal syndrome. Family states that she  had been abusing Norco about one month prior and they are unsure if she was still taking the medication, but withdrawal from such with encephalopathy as the presentation would be atypical. Other medications that could result in a withdrawal syndrome include baclofen and Neurontin, however, these are not listed on her home medications.     Recommend: 1) Repeat CT head 02/23/12. If a stroke has occurred, it should be visible on the follow up CT. Unable to perform MRI.  2) FLP, Hba1C 3) I have ordered ammonia, TSH, LFT 4) Carotid doppler, no need for TEE as just performed post surgery 02/20/12 5) Start scheduled IV Valium and monitor for possible improvement of her delirium with this medication. Will defer to primary team for final decision on this medication recommendation.  6) Have family bring in all pill bottles from her home to ensure that none of her home medications have the potential for inducing a withdrawal syndrome upon abrupt discontinuation. 7) Consider rectal aspirin suppository if it cannot be administered PO or via NG tube.  8) EEG.   Due to critical complexity and >50 minutes spent on note, exam and time with family level 5 charge applies  Felicie Morn PA-C [facility] [phone]  02/21/2012, 11:33 AM  Electronically signed: Dr. Caryl Pina

## 2012-02-21 NOTE — Progress Notes (Signed)
1 Day Post-Op Procedure(s) (LRB): AORTIC VALVE REPLACEMENT (AVR) (N/A) Subjective: Unable to converse, calls for mother  Objective: Vital signs in last 24 hours: Temp:  [96.4 F (35.8 C)-99.1 F (37.3 C)] 99.1 F (37.3 C) (05/21 0815) Pulse Rate:  [74-91] 78  (05/21 0815) Cardiac Rhythm:  [-] Normal sinus rhythm (05/21 0800) Resp:  [0-28] 17  (05/21 0815) BP: (86-154)/(53-74) 127/54 mmHg (05/21 0800) SpO2:  [86 %-100 %] 99 % (05/21 0815) Arterial Line BP: (86-163)/(49-79) 123/56 mmHg (05/21 0815) FiO2 (%):  [40 %-50 %] 40 % (05/20 2029) Weight:  [141 lb 12.1 oz (64.3 kg)] 141 lb 12.1 oz (64.3 kg) (05/21 0600)  Hemodynamic parameters for last 24 hours: PAP: (18-47)/(12-27) 35/20 mmHg CO:  [2.9 L/min-4.6 L/min] 3.8 L/min CI:  [1.9 L/min/m2-3 L/min/m2] 2.4 L/min/m2  Intake/Output from previous day: 05/20 0701 - 05/21 0700 In: 6482.1 [I.V.:4312.1; Blood:190; NG/GT:30; IV Piggyback:1950] Out: 3200 [Urine:2220; Emesis/NG output:200; Blood:600; Chest Tube:180] Intake/Output this shift: Total I/O In: 53.3 [I.V.:53.3] Out: -   General appearance: no distress Neurologic: moves all 4 extremities, does not follow commands, repeats "Ma" over and over Heart: regular rate and rhythm Lungs: diminished breath sounds bibasilar  Lab Results:  Basename 02/21/12 0331 02/20/12 1830  WBC 5.5 4.8  HGB 8.8* 9.5*  HCT 26.3* 28.4*  PLT 156 152   BMET:  Basename 02/21/12 0331 02/20/12 1830 02/20/12 1824  NA 135 -- 139  K 5.8* -- 3.7  CL 108 -- 114*  CO2 18* -- --  GLUCOSE 97 -- 118*  BUN 16 -- 13  CREATININE 0.79 0.83 --  CALCIUM 7.6* -- --    PT/INR:  Basename 02/20/12 1230  LABPROT 21.4*  INR 1.82*   ABG    Component Value Date/Time   PHART 7.383 02/20/2012 1220   HCO3 22.1 02/20/2012 1220   TCO2 19 02/20/2012 1824   ACIDBASEDEF 3.0* 02/20/2012 1220   O2SAT 93.0 02/20/2012 1220   CBG (last 3)   Basename 02/21/12 0734 02/21/12 0338 02/20/12 2335  GLUCAP 96 104* 117*     Assessment/Plan: S/P Procedure(s) (LRB): AORTIC VALVE REPLACEMENT (AVR) (N/A) POD # 1 AVR Neuro- clearly altered mental status, doesn;t engage or follow commands. Pupils equal and reactive and moves all 4 extremities spontaneously, concerned for possible CVA- will get head CT  CV- s/p tissue AVR, hemodynamically stable, d/c swan, wean dopamine  RESP- bronchial hygiene will be difficult until MS improves  RENAL- hyperkalemia- follow, diurese for volume overload  CBG well controlled   LOS: 1 day    Previn Jian C 02/21/2012

## 2012-02-22 ENCOUNTER — Inpatient Hospital Stay (HOSPITAL_COMMUNITY): Payer: Medicaid Other

## 2012-02-22 LAB — HEMOGLOBIN A1C: Mean Plasma Glucose: 111 mg/dL (ref <117)

## 2012-02-22 LAB — GLUCOSE, CAPILLARY
Glucose-Capillary: 100 mg/dL — ABNORMAL HIGH (ref 70–99)
Glucose-Capillary: 102 mg/dL — ABNORMAL HIGH (ref 70–99)
Glucose-Capillary: 83 mg/dL (ref 70–99)
Glucose-Capillary: 89 mg/dL (ref 70–99)

## 2012-02-22 LAB — BASIC METABOLIC PANEL
BUN: 27 mg/dL — ABNORMAL HIGH (ref 6–23)
CO2: 20 mEq/L (ref 19–32)
CO2: 20 mEq/L (ref 19–32)
Calcium: 8.6 mg/dL (ref 8.4–10.5)
Chloride: 105 mEq/L (ref 96–112)
Creatinine, Ser: 1.27 mg/dL — ABNORMAL HIGH (ref 0.50–1.10)
Creatinine, Ser: 1.09 mg/dL (ref 0.50–1.10)
GFR calc non Af Amer: 46 mL/min — ABNORMAL LOW (ref >90)
Glucose, Bld: 98 mg/dL (ref 70–99)
Sodium: 135 mEq/L (ref 135–145)

## 2012-02-22 LAB — CBC
MCV: 89.3 fL (ref 78.0–100.0)
Platelets: 130 10*3/uL — ABNORMAL LOW (ref 150–400)
RDW: 15.7 % — ABNORMAL HIGH (ref 11.5–15.5)
WBC: 6.1 10*3/uL (ref 4.0–10.5)

## 2012-02-22 LAB — LIPID PANEL: LDL Cholesterol: 74 mg/dL (ref 0–99)

## 2012-02-22 MED ORDER — PANTOPRAZOLE SODIUM 40 MG IV SOLR
40.0000 mg | Freq: Every morning | INTRAVENOUS | Status: DC
  Administered 2012-02-22 – 2012-02-23 (×2): 40 mg via INTRAVENOUS
  Filled 2012-02-22 (×2): qty 40

## 2012-02-22 MED ORDER — LEVOTHYROXINE SODIUM 100 MCG IV SOLR
25.0000 ug | Freq: Every day | INTRAVENOUS | Status: DC
  Administered 2012-02-22: 26 ug via INTRAVENOUS
  Administered 2012-02-23: 12:00:00 via INTRAVENOUS
  Filled 2012-02-22 (×3): qty 1.3

## 2012-02-22 NOTE — Progress Notes (Signed)
2 Days Post-Op Procedure(s) (LRB): AORTIC VALVE REPLACEMENT (AVR) (N/A) Subjective: Opens eyes to voice, not following commands for me at present, but per RN was oriented to person and place and following simple, one step commands earlier She does appear a little more alert today  Objective: Vital signs in last 24 hours: Temp:  [97.3 F (36.3 C)-98.6 F (37 C)] 98 F (36.7 C) (05/22 0748) Pulse Rate:  [66-95] 72  (05/22 0700) Cardiac Rhythm:  [-] Normal sinus rhythm (05/22 0600) Resp:  [15-23] 15  (05/22 0700) BP: (120-168)/(59-89) 150/64 mmHg (05/22 0700) SpO2:  [95 %-100 %] 100 % (05/22 0700) Arterial Line BP: (135-170)/(57-74) 163/70 mmHg (05/22 0700) Weight:  [144 lb 10 oz (65.6 kg)] 144 lb 10 oz (65.6 kg) (05/22 0500)  Hemodynamic parameters for last 24 hours:    Intake/Output from previous day: 05/21 0701 - 05/22 0700 In: 1000.6 [I.V.:596.6; IV Piggyback:404] Out: 1135 [Urine:1095; Chest Tube:40] Intake/Output this shift:    General appearance: no distress Neurologic: see above Heart: regular rate and rhythm Lungs: diminished breath sounds bibasilar Abdomen: normal findings: soft, non-tender  Lab Results:  Basename 02/22/12 0416 02/21/12 1524  WBC 6.1 7.4  HGB 8.9* 9.0*  HCT 26.7* 27.2*  PLT 130* 140*   BMET:  Basename 02/22/12 0416 02/21/12 1524  NA 135 136  K 4.0 4.0  CL 104 104  CO2 20 19  GLUCOSE 100* 105*  BUN 26* 20  CREATININE 1.27* 1.06  CALCIUM 8.6 8.6    PT/INR:  Basename 02/20/12 1230  LABPROT 21.4*  INR 1.82*   ABG    Component Value Date/Time   PHART 7.347* 02/21/2012 0841   HCO3 19.9* 02/21/2012 0841   TCO2 21 02/21/2012 0841   ACIDBASEDEF 5.0* 02/21/2012 0841   O2SAT 92.0 02/21/2012 0841   CBG (last 3)   Basename 02/22/12 0745 02/22/12 0423 02/21/12 2326  GLUCAP 102* 104* 100*    Assessment/Plan: S/P Procedure(s) (LRB): AORTIC VALVE REPLACEMENT (AVR) (N/A) POD # 2 AVR Neuro- still with altered mental status. Per family has  had unusual response to GA with previous surgeries. She still may have had a CVA, but possible this is some type of encephalopathy, will follow.  CV- HTN, PO meds being held due to MS, lopressor IV PRN  RESP- CXR OK, no issues so far but can't cooperate with pulmonary hygiene currently  RENAL- creatinine up slightly, lytes OK, follow  PAS for DVT prophylaxis   LOS: 2 days    Fidencia Mccloud C 02/22/2012

## 2012-02-22 NOTE — Progress Notes (Signed)
CSW met with pt daughter at bedside. She states last night was uneventful with pt mother and she feels more calm now that some of the crisis has subsided, very appreciative of CSW support yesterday.   CSW had preliminary discussion with her around disposition options, advising of ST SNF as a possibility if pt should require a higher level of care than she can provide at home. Pt daughter understanding and appreciative, hopeful to bring pt home if possible but open to SNF if necessary.   CSW will continue to follow to address disposition as appropriate to pt progression. Hopeful she will clear up!  Baxter Flattery, MSW [phone]

## 2012-02-23 ENCOUNTER — Inpatient Hospital Stay (HOSPITAL_COMMUNITY): Payer: Medicaid Other

## 2012-02-23 DIAGNOSIS — R197 Diarrhea, unspecified: Secondary | ICD-10-CM

## 2012-02-23 DIAGNOSIS — R4789 Other speech disturbances: Secondary | ICD-10-CM

## 2012-02-23 DIAGNOSIS — G459 Transient cerebral ischemic attack, unspecified: Secondary | ICD-10-CM

## 2012-02-23 LAB — BASIC METABOLIC PANEL
CO2: 19 mEq/L (ref 19–32)
Chloride: 104 mEq/L (ref 96–112)
GFR calc non Af Amer: 67 mL/min — ABNORMAL LOW (ref >90)
Glucose, Bld: 82 mg/dL (ref 70–99)
Potassium: 4 mEq/L (ref 3.5–5.1)
Sodium: 136 mEq/L (ref 135–145)

## 2012-02-23 LAB — GLUCOSE, CAPILLARY
Glucose-Capillary: 103 mg/dL — ABNORMAL HIGH (ref 70–99)
Glucose-Capillary: 124 mg/dL — ABNORMAL HIGH (ref 70–99)

## 2012-02-23 LAB — CBC
Hemoglobin: 9.5 g/dL — ABNORMAL LOW (ref 12.0–15.0)
MCH: 29.6 pg (ref 26.0–34.0)
MCV: 89.7 fL (ref 78.0–100.0)
RBC: 3.21 MIL/uL — ABNORMAL LOW (ref 3.87–5.11)

## 2012-02-23 MED ORDER — METOPROLOL TARTRATE 1 MG/ML IV SOLN
5.0000 mg | Freq: Four times a day (QID) | INTRAVENOUS | Status: DC
  Administered 2012-02-23: 5 mg via INTRAVENOUS

## 2012-02-23 MED ORDER — ALPRAZOLAM 0.5 MG PO TABS
0.5000 mg | ORAL_TABLET | Freq: Three times a day (TID) | ORAL | Status: DC | PRN
  Administered 2012-02-23 – 2012-02-29 (×11): 0.5 mg via ORAL
  Filled 2012-02-23 (×12): qty 1

## 2012-02-23 MED ORDER — LEVOTHYROXINE SODIUM 50 MCG PO TABS
50.0000 ug | ORAL_TABLET | Freq: Every day | ORAL | Status: DC
  Administered 2012-02-24 – 2012-02-29 (×6): 50 ug via ORAL
  Filled 2012-02-23 (×8): qty 1

## 2012-02-23 MED ORDER — PANTOPRAZOLE SODIUM 40 MG PO TBEC
40.0000 mg | DELAYED_RELEASE_TABLET | Freq: Every day | ORAL | Status: DC
  Administered 2012-02-24 – 2012-02-28 (×5): 40 mg via ORAL
  Filled 2012-02-23 (×5): qty 1

## 2012-02-23 MED ORDER — INSULIN ASPART 100 UNIT/ML ~~LOC~~ SOLN
0.0000 [IU] | Freq: Three times a day (TID) | SUBCUTANEOUS | Status: DC
  Administered 2012-02-26: 3 [IU] via SUBCUTANEOUS

## 2012-02-23 MED ORDER — FUROSEMIDE 10 MG/ML IJ SOLN
40.0000 mg | Freq: Once | INTRAMUSCULAR | Status: AC
  Administered 2012-02-23: 40 mg via INTRAVENOUS
  Filled 2012-02-23: qty 4

## 2012-02-23 MED ORDER — POTASSIUM CHLORIDE 10 MEQ/50ML IV SOLN
10.0000 meq | INTRAVENOUS | Status: AC
  Administered 2012-02-23 (×3): 10 meq via INTRAVENOUS
  Filled 2012-02-23: qty 150

## 2012-02-23 MED ORDER — NICOTINE 21 MG/24HR TD PT24
21.0000 mg | MEDICATED_PATCH | TRANSDERMAL | Status: DC
  Administered 2012-02-24 – 2012-02-28 (×6): 21 mg via TRANSDERMAL
  Filled 2012-02-23 (×9): qty 1

## 2012-02-23 MED ORDER — ASPIRIN EC 325 MG PO TBEC
325.0000 mg | DELAYED_RELEASE_TABLET | Freq: Every day | ORAL | Status: DC
  Administered 2012-02-23 – 2012-02-29 (×7): 325 mg via ORAL
  Filled 2012-02-23 (×7): qty 1

## 2012-02-23 MED ORDER — METOPROLOL TARTRATE 25 MG PO TABS
25.0000 mg | ORAL_TABLET | Freq: Two times a day (BID) | ORAL | Status: DC
  Administered 2012-02-23 – 2012-02-29 (×13): 25 mg via ORAL
  Filled 2012-02-23 (×14): qty 1

## 2012-02-23 MED ORDER — BENAZEPRIL HCL 10 MG PO TABS
10.0000 mg | ORAL_TABLET | Freq: Every day | ORAL | Status: DC
  Administered 2012-02-23 – 2012-02-29 (×7): 10 mg via ORAL
  Filled 2012-02-23 (×7): qty 1

## 2012-02-23 MED ORDER — AMLODIPINE BESYLATE 10 MG PO TABS
10.0000 mg | ORAL_TABLET | Freq: Every day | ORAL | Status: DC
  Administered 2012-02-23 – 2012-02-29 (×7): 10 mg via ORAL
  Filled 2012-02-23 (×7): qty 1

## 2012-02-23 NOTE — Progress Notes (Signed)
Patient is receiving Protonix and Synthroid by the IV route.  Pt meets the P & T approved criteria for changing to oral administration.  - No GI bleeding  - Tolerating an oral or per tube diet (reviewed SLP recs)  - Taking other oral or per tube medications.  Will change patient to Protonix 40mg  PO daily and Synthroid PO daily.  per P&T policy  Thank you. [employer], Pharm.D., BCPS Clinical Pharmacist Pager [phone]

## 2012-02-23 NOTE — Evaluation (Signed)
Clinical/Bedside Swallow Evaluation Patient Details  Name: [name] MRN: [id number] Date of Birth: 06/29/1956  Today's Date: 02/23/2012 Time: 7829-5621 SLP Time Calculation (min): 22 min  Past Medical History:  Past Medical History  Diagnosis Date  . Smoking history   . Aortic valve stenosis   . Back pain, chronic   . Chest pain   . SOB (shortness of breath)   . Coronary artery disease     mild; non-hemodynamically significant  . CHF (congestive heart failure)   . Dyslipidemia   . Thyroid disease     hypothyroidism  . Barrett esophagus   . GERD (gastroesophageal reflux disease)     with PUD  . PUD (peptic ulcer disease)   . Depression   . Deaf   . Sinus congestion   . Wheezing   . Rash, skin     2 weeks ago, bilateral hand rash, peeling, eruptions   . Seizures     as a child at age 31  . Hypothyroidism   . Hypertension   . Arthritis     back, hips, legs  . PONV (postoperative nausea and vomiting)    Past Surgical History:  Past Surgical History  Procedure Date  . Cardiac catheterization 01/2010  . Cholecystectomy   . Cesarean section     x 2  . Vaginal hysterectomy   . Tonsillectomy and adenoidectomy     at age 70  . Aortic valve replacement 02/20/2012    Procedure: AORTIC VALVE REPLACEMENT (AVR);  Surgeon: Loreli Slot, MD;  Location: [facility] OR;  Service: Open Heart Surgery;  Laterality: N/A;   HPI:  [name] is an 56 y.o. female who underwent a AO valve replacement on 02/20/12.  Intubated briefly for procedureWhile in step-down the following day 02/21/12 patient was noted to be anxious and O2 saturation on 6L [place] to be 92%. At [facility] today patient noted to have clear speech when cursing but unable to follow instructions or converse with staff.  Question of CVA given mental status, CT does not indicate acute infarct.    Assessment / Plan / Recommendation Clinical Impression  Pt initially observed to show no s/s of aspiration or dysphagia with thin  liquids. Upon further observation outside the room, pt noted to have coughing episode with independent use of straw. Will continue to follow for tolerance of cup sips only of thin liquids/Dysphagia 3 mechanical soft diet.     Aspiration Risk  Mild    Diet Recommendation Dysphagia 3 (Mechanical Soft)   Other  Recommendations     Follow Up Recommendations  Skilled Nursing facility    Frequency and Duration min 2x/week  2 weeks   Pertinent Vitals/Pain NA    SLP Swallow Goals Patient will consume recommended diet without observed clinical signs of aspiration with: Supervision/safety Patient will utilize recommended strategies during swallow to increase swallowing safety with: Minimal cueing   Swallow Study Prior Functional Status       General HPI: [name] is an 56 y.o. female who underwent a AO valve replacement on 02/20/12.  Intubated briefly for procedureWhile in step-down the following day 02/21/12 patient was noted to be anxious and O2 saturation on 6L [place] to be 92%. At [facility] today patient noted to have clear speech when cursing but unable to follow instructions or converse with staff.  Question of CVA given mental status, CT does not indicate acute infarct.  Type of Study: Bedside swallow evaluation Diet Prior to this Study:  NPO Temperature Spikes Noted: No Respiratory Status: Room air History of Intubation: Yes Length of Intubations (days): 1 days Date extubated: 02/20/12 Behavior/Cognition: Alert;Confused;Uncooperative Oral Cavity - Dentition: Edentulous Vision: Functional for self-feeding Patient Positioning: Upright in bed Baseline Vocal Quality: Clear Volitional Cough: Strong Volitional Swallow: Able to elicit    Oral/Motor/Sensory Function Overall Oral Motor/Sensory Function: Appears within functional limits for tasks assessed   Ice Chips     Thin Liquid Thin Liquid: Impaired Presentation: Cup;Self Fed;Straw Pharyngeal  Phase Impairments: Cough - Immediate  (with straw)    Nectar Thick Nectar Thick Liquid: Not tested   Honey Thick Honey Thick Liquid: Not tested   Puree Puree: Not tested (Pt refused)   Solid Solid: Not tested (Pt refused)    Danthony Kendrix, Riley Nearing 02/23/2012,9:43 AM

## 2012-02-23 NOTE — Progress Notes (Signed)
[facility] PROGRESS NOTE    SUBJECTIVE   Patient sitting in chair. She is alert and appears oriented with the exception that she thinks that the year is 66. She is not sure if it is daytime or night time. Good family support, probable SNF discussed with family.  HPI AO valve replacement 02/20/2012. TEE post op for valve inspection with no perivalvular leaks. Some electrolyte disorders, corrected. Mg level ok. Ammonia level good.  OBJECTIVE   Vital signs in last 24 hours: Temp:  [96.3 F (35.7 C)-97.8 F (36.6 C)] 97.5 F (36.4 C) (05/23 0400) Pulse Rate:  [61-79] 70  (05/23 1000) Resp:  [9-23] 20  (05/23 1000) BP: (96-190)/(63-128) 182/86 mmHg (05/23 1000) SpO2:  [94 %-100 %] 96 % (05/23 1000) Arterial Line BP: (139-194)/(60-87) 189/86 mmHg (05/23 0700) Weight:  [62.9 kg (138 lb 10.7 oz)] 62.9 kg (138 lb 10.7 oz) (05/23 0500)  Intake/Output from previous day: 05/22 0701 - 05/23 0700 In: 610 [P.O.:80; I.V.:480; IV Piggyback:50] Out: 585 [Urine:585] Intake/Output this shift: Total I/O In: 104 [IV Piggyback:104] Out: 880 [Urine:880] Nutritional status: Dysphagia  Past Medical History  Diagnosis Date  . Smoking history   . Aortic valve stenosis   . Back pain, chronic   . Chest pain   . SOB (shortness of breath)   . Coronary artery disease     mild; non-hemodynamically significant  . CHF (congestive heart failure)   . Dyslipidemia   . Thyroid disease     hypothyroidism  . Barrett esophagus   . GERD (gastroesophageal reflux disease)     with PUD  . PUD (peptic ulcer disease)   . Depression   . Deaf   . Sinus congestion   . Wheezing   . Rash, skin     2 weeks ago, bilateral hand rash, peeling, eruptions   . Seizures     as a child at age 12  . Hypothyroidism   . Hypertension   . Arthritis     back, hips, legs  . PONV (postoperative nausea and vomiting)     Neurologic Exam:   Mental Status: Alert,  oriented, thought content appropriate with the exception that she thinks the year is 1993, but is otherwise oriented. Follows simple commands. Appears groggy. Cranial Nerves: II-Visual fields grossly intact. III/IV/VI-Extraocular movements intact.  Pupils reactive bilaterally. V/VII-Smile symmetric VIII-grossly intact IX/X-normal gag XI-bilateral shoulder shrug XII-midline tongue extension Motor: decreased  bilaterally Sensory: Pinprick and light touch intact throughout, bilaterally Deep Tendon Reflexes: 1+ decreased throughout yet symmetric throughout Plantars downgoing bilaterally   Lab Results: Lab Results  Component Value Date/Time   CHOL 119 02/22/2012  4:00 PM   Lipid Panel  Basename 02/22/12 1600  CHOL 119  TRIG 137  HDL 18*  CHOLHDL 6.6  VLDL 27  LDLCALC 74    Studies/Results: Ct Head Wo Contrast  02/23/2012  *RADIOLOGY REPORT*  Clinical Data: Post aortic valve replacement.  Altered mental status.  Question stroke?  CT HEAD WITHOUT CONTRAST  Technique:  Contiguous axial images were obtained from the base of the skull through the vertex without contrast.  Comparison: None.  Findings: No intracranial hemorrhage.  No intracranial mass lesion detected on this unenhanced exam.  Small hypodensities within the frontal lobe larger on the left. Findings  suggestive of result of ischemia/infarcts, age indeterminate.  Subtle indistinctness of the inferior posterior aspect of the left lenticular nucleus.  This may be normal.  Early infarct not entirely excluded.  Streak artifact in the pons limiting evaluation for possible small infarct particularly right paracentral pontomedullary junction.  Vascular calcifications.  No hydrocephalus.  No intracranial mass lesion detected on this unenhanced exam.  Partial opacification mastoid air cells more notable on the left.  IMPRESSION: Small hypodensities within the frontal lobe larger on the left. Findings suggestive of result of ischemia/infarcts,  age indeterminate.  Subtle indistinctness of the inferior posterior aspect of the left lenticular nucleus.  This may be normal.  Early infarct not entirely excluded.  Streak artifact in the pons limiting evaluation for possible small infarct particularly right paracentral pontomedullary junction.  Partial opacification mastoid air cells more notable on the left.  This has been made a PRA call report utilizing dashboard call feature.  Original Report Authenticated By: Fuller Canada, M.D.   Dg Chest Port 1 View  02/23/2012  *RADIOLOGY REPORT*  Clinical Data: Postop open heart surgery.  PORTABLE CHEST - 1 VIEW  Comparison: 02/22/2012.  Findings: Trachea is midline.  Right IJ catheter sheath remains in place.  Heart size stable.  Sternal wires are unchanged in position.  Epicardial pacer wires remain in place.  Mild bibasilar atelectasis, with improving aeration at the right lung base. Probable small left pleural effusion.  No definite pneumothorax.  IMPRESSION:  1.  Mild bibasilar atelectasis with improving aeration at the right lung base. 2.  Small left pleural effusion.  Original Report Authenticated By: Reyes Ivan, M.D.   Dg Chest Portable 1 View In Am  02/22/2012  *RADIOLOGY REPORT*  Clinical Data: Postop cardiac surgery  PORTABLE CHEST - 1 VIEW  Comparison: 02/21/2012; 02/20/2012; 02/16/2012; 05/05/2011  Findings: Grossly unchanged enlarged cardiac silhouette post median sternotomy and aortic valve repair.  Interval removal of a PA catheter with remaining vascular sheath tip overlying the superior aspect of the SVC.  Mild pulmonary venous congestion without frank evidence of edema.  Overall improved aeration of the bilateral lungs with persistent basilar opacity favored to represent atelectasis.  No definite pleural effusion.  Unchanged bones.  IMPRESSION: 1.  Interval removal PA catheter with vascular sheath tip remaining over the superior aspect of the SVC. 2.  Improved aeration of the lungs with  persistent bibasilar opacities favored to represent atelectasis. 3.  Pulmonary venous congestion without evidence of edema.  Original Report Authenticated By: Waynard Reeds, M.D.    Medications:     Scheduled:   . acetaminophen  1,000 mg Intravenous Q6H  . amLODipine  10 mg Oral Daily  . antiseptic oral rinse  15 mL Mouth Rinse Q4H  . aspirin EC  325 mg Oral Daily  . benazepril  10 mg Oral Daily  . bisacodyl  10 mg Oral Daily   Or  . bisacodyl  10 mg Rectal Daily  . docusate sodium  200 mg Oral Daily  . furosemide  40 mg Intravenous Once  . insulin aspart  0-15 Units Subcutaneous TID WC  . levothyroxine  26 mcg Intravenous Daily  . metoprolol tartrate  25 mg Oral BID  . pantoprazole (PROTONIX) IV  40 mg Intravenous q morning - 10a  . potassium chloride  10 mEq Intravenous Q1 Hr x 3  . sodium chloride  3 mL Intravenous Q12H  . DISCONTD: aspirin  300 mg Rectal Daily  . DISCONTD: insulin aspart  0-24 Units Subcutaneous Q4H  . DISCONTD: levothyroxine  50 mcg Oral QAC breakfast  . DISCONTD: metoprolol  5 mg Intravenous Q6H  . DISCONTD: metoprolol tartrate  12.5 mg Per Tube BID  . DISCONTD: metoprolol tartrate  12.5 mg Oral BID  . DISCONTD: pantoprazole  40 mg Oral Q1200     INTRA-OP TEE 02/20/2012  Post procedure conclusions Left ventricle: Good LVEF. Aortic valve:  - Prosthetic valve well seated in the Aortic annulus. No AI seen in LV outflow tract. Post replacement images demonstrate no residual valvular insufficiency or perivalvular leak. 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 Ascending Aorta:  - The aorta was mild-moderately calcified.  ------------------------------------------------------------ Prepared and Electronically Authenticated by  Kipp Brood, MD 2013-05-20T11:[id number]   Assessment/Plan:   56yo female s/p AVR, ECASA 325mg  daily. CT scan reveals hypodensities of frontal lobe larger on the left.  Generalized weakness. Electrolytes being corrected. Repeat CT has been cancelled.  1) Small hypodensities, more so on the left, concerning for ischemia/infarct, age undetermined. 2) Carotid dopplers: no significant ICA stenosis identified. 3) Intra-op TEE reviewed as above 4) Post op delerium. TSH and Ammonia Levels normal. In addition, consider a sundowners type syndrome in addition to overall picture give head CT.   Guy Franco PA-C [facility] Pager [phone]   02/23/2012, 10:55 AM

## 2012-02-23 NOTE — Plan of Care (Signed)
Problem: Phase III Progression Outcomes Goal: Advance to regular diet without nausea Outcome: Completed/Met Date Met:  02/23/12 Pt tolerating dysphagia 3 diet

## 2012-02-23 NOTE — Progress Notes (Signed)
Patient ID: [name], female   DOB: Oct 01, 1956, 56 y.o.   MRN: [id number]   Filed Vitals:   02/23/12 1527 02/23/12 1600 02/23/12 1800 02/23/12 1900  BP:  162/70 172/88 171/121  Pulse:  38 45   Temp: 97.6 F (36.4 C)     TempSrc: Oral     Resp:  24 20 20   Height:      Weight:      SpO2:   95%    Agitated this pm. Wants a cigarette. Head CT shows some small frontal lobe densities suggesting infarcts of indeterminate age. Urine output ok   A/P: stable postop delirium.  She has a Comptroller. Otherwise stable.

## 2012-02-23 NOTE — Progress Notes (Signed)
CSW reviewed chart and  initiated SNF search based on Speech Therapy and Neuro remards. Will need PT evals. Will follow to address disposition as pt progresses.  [name]

## 2012-02-23 NOTE — Progress Notes (Signed)
MD aware of high BP.  New orders received.  Nsg will continue to monitor.

## 2012-02-24 ENCOUNTER — Inpatient Hospital Stay (HOSPITAL_COMMUNITY): Payer: Medicaid Other

## 2012-02-24 LAB — BASIC METABOLIC PANEL
CO2: 23 mEq/L (ref 19–32)
Chloride: 97 mEq/L (ref 96–112)
Glucose, Bld: 102 mg/dL — ABNORMAL HIGH (ref 70–99)
Potassium: 3 mEq/L — ABNORMAL LOW (ref 3.5–5.1)
Sodium: 135 mEq/L (ref 135–145)

## 2012-02-24 LAB — GLUCOSE, CAPILLARY: Glucose-Capillary: 91 mg/dL (ref 70–99)

## 2012-02-24 LAB — CBC
Hemoglobin: 10.7 g/dL — ABNORMAL LOW (ref 12.0–15.0)
MCH: 29.1 pg (ref 26.0–34.0)
RBC: 3.68 MIL/uL — ABNORMAL LOW (ref 3.87–5.11)
WBC: 8.6 10*3/uL (ref 4.0–10.5)

## 2012-02-24 MED ORDER — DIPHENHYDRAMINE HCL 25 MG PO CAPS
25.0000 mg | ORAL_CAPSULE | Freq: Every evening | ORAL | Status: DC | PRN
  Administered 2012-02-24 – 2012-02-26 (×2): 25 mg via ORAL
  Filled 2012-02-24 (×2): qty 1

## 2012-02-24 MED ORDER — POTASSIUM CHLORIDE 10 MEQ/50ML IV SOLN
10.0000 meq | INTRAVENOUS | Status: AC | PRN
  Administered 2012-02-24 (×3): 10 meq via INTRAVENOUS

## 2012-02-24 MED ORDER — POTASSIUM CHLORIDE 10 MEQ/50ML IV SOLN
INTRAVENOUS | Status: AC
  Administered 2012-02-24: 10 meq
  Filled 2012-02-24: qty 50

## 2012-02-24 MED ORDER — POTASSIUM CHLORIDE 10 MEQ/50ML IV SOLN
INTRAVENOUS | Status: AC
  Filled 2012-02-24: qty 50

## 2012-02-24 MED ORDER — POTASSIUM CHLORIDE 10 MEQ/50ML IV SOLN
10.0000 meq | INTRAVENOUS | Status: AC
  Administered 2012-02-24: 10 meq via INTRAVENOUS
  Filled 2012-02-24 (×2): qty 50

## 2012-02-24 NOTE — Progress Notes (Signed)
4 Days Post-Op Procedure(s) (LRB): AORTIC VALVE REPLACEMENT (AVR) (N/A) Subjective: C/o inability to sleep Wants to go home  Objective: Vital signs in last 24 hours: Temp:  [97.5 F (36.4 C)-97.8 F (36.6 C)] 97.5 F (36.4 C) (05/24 0738) Pulse Rate:  [38-94] 94  (05/24 0445) Cardiac Rhythm:  [-] Normal sinus rhythm (05/24 0600) Resp:  [15-25] 19  (05/24 0700) BP: (96-196)/(67-133) 153/94 mmHg (05/24 0700) SpO2:  [86 %-100 %] 99 % (05/24 0700) Weight:  [135 lb 12.9 oz (61.6 kg)] 135 lb 12.9 oz (61.6 kg) (05/24 0500)  Hemodynamic parameters for last 24 hours:    Intake/Output from previous day: 05/23 0701 - 05/24 0700 In: 824 [P.O.:240; I.V.:480; IV Piggyback:104] Out: 4100 [Urine:4100] Intake/Output this shift:    General appearance: alert and no distress Neurologic: seems to favor left side a little, but moves right with good strength, mental status much improved Heart: regular rate and rhythm Lungs: clear to auscultation bilaterally Wound: intact  Lab Results:  Basename 02/24/12 0420 02/23/12 0400  WBC 8.6 7.7  HGB 10.7* 9.5*  HCT 32.1* 28.8*  PLT 189 148*   BMET:  Basename 02/24/12 0420 02/23/12 0400  NA 135 136  K 3.0* 4.0  CL 97 104  CO2 23 19  GLUCOSE 102* 82  BUN 14 25*  CREATININE 0.72 0.93  CALCIUM 8.9 8.8    PT/INR: No results found for this basename: LABPROT,INR in the last 72 hours ABG    Component Value Date/Time   PHART 7.347* 02/21/2012 0841   HCO3 19.9* 02/21/2012 0841   TCO2 21 02/21/2012 0841   ACIDBASEDEF 5.0* 02/21/2012 0841   O2SAT 92.0 02/21/2012 0841   CBG (last 3)   Basename 02/23/12 2143 02/23/12 1525 02/23/12 1148  GLUCAP 124* 103* 80    Assessment/Plan: S/P Procedure(s) (LRB): AORTIC VALVE REPLACEMENT (AVR) (N/A) She continues to slowly progress. She's relatively cooperative, but impulsive. She is still al ittle weak. Overall picture more consistent with encephalopathy, delirium. Taking PO HTN improved Ambulating  better Still requiring 24 hour supervision with sitter Consider transfer to PTCU later today   LOS: 4 days    Jeffery Bachmeier C 02/24/2012

## 2012-02-24 NOTE — Progress Notes (Signed)
Speech Language Pathology Dysphagia Treatment Patient Details Name: [name] MRN: [id number] DOB: 01/29/1956 Today's Date: 02/24/2012 Time: 0815-0850 SLP Time Calculation (min): 35 min  Assessment / Plan / Recommendation Clinical Impression  Pt observed with breakfast, noted to be impulsive with intake, pocketing food in left buccal cavity. Verbal visual cues required for pt to transit bolus. Signs of decreased airway protection evident with large sisp sof thin liquids. CXR concerning for increased consolidation on right. Given these findings would recommend an objective test to etermine pts safety with POs. Will plan to complete test when pts daughter is present given pts agitation and poor cooperation, possible 1-130 today. Will downgrade pts diet to a dysphagia 2 minced consitency.     Diet Recommendation  Initiate / Change Diet: Dysphagia 2 (fine chop);Thin liquid    SLP Plan     Pertinent Vitals/Pain NA   Swallowing Goals  SLP Swallowing Goals Patient will consume recommended diet without observed clinical signs of aspiration with: Supervision/safety Swallow Study Goal #1 - Progress: Progressing toward goal Patient will utilize recommended strategies during swallow to increase swallowing safety with: Minimal cueing Swallow Study Goal #2 - Progress: Progressing toward goal  General Temperature Spikes Noted: No Respiratory Status: Supplemental O2 delivered via (comment) Behavior/Cognition: Alert;Confused;Uncooperative Oral Cavity - Dentition: Edentulous Patient Positioning: Upright in bed  Oral Cavity - Oral Hygiene Does patient have any of the following "at risk" factors?: None of the above   Dysphagia Treatment Treatment focused on: Skilled observation of diet tolerance Treatment Methods/Modalities: Skilled observation Patient observed directly with PO's: Yes Type of PO's observed: Dysphagia 3 (soft);Thin liquids Feeding: Able to feed self Liquids provided via:  Cup Oral Phase Signs & Symptoms: Left pocketing;Prolonged mastication;Prolonged bolus formation;Prolonged oral phase Pharyngeal Phase Signs & Symptoms: Suspected delayed swallow initiation;Delayed cough;Immediate cough Type of cueing: Verbal Amount of cueing: Moderate   Sotiria Keast, Riley Nearing 02/24/2012, 9:07 AM

## 2012-02-24 NOTE — Progress Notes (Signed)
Clinical Social Worker reviewed bed offers with pt's dtr-Melissa.  CSW addressed all questions/concerns.  Pt currently unable to participate in assessment.  CSW to continue to follow and assist as needed.  Angelia Mould, MSW, [place] [phone]

## 2012-02-24 NOTE — Procedures (Signed)
Objective Swallowing Evaluation: Modified Barium Swallowing Study  Patient Details  Name: [name] MRN: [id number] Date of Birth: [birth year]  Today's Date: 02/24/2012 Time: 1310-1325 SLP Time Calculation (min): 15 min  Past Medical History:  Past Medical History  Diagnosis Date  . Smoking history   . Aortic valve stenosis   . Back pain, chronic   . Chest pain   . SOB (shortness of breath)   . Coronary artery disease     mild; non-hemodynamically significant  . CHF (congestive heart failure)   . Dyslipidemia   . Thyroid disease     hypothyroidism  . Barrett esophagus   . GERD (gastroesophageal reflux disease)     with PUD  . PUD (peptic ulcer disease)   . Depression   . Deaf   . Sinus congestion   . Wheezing   . Rash, skin     2 weeks ago, bilateral hand rash, peeling, eruptions   . Seizures     as a child at age 52  . Hypothyroidism   . Hypertension   . Arthritis     back, hips, legs  . PONV (postoperative nausea and vomiting)    Past Surgical History:  Past Surgical History  Procedure Date  . Cardiac catheterization 01/2010  . Cholecystectomy   . Cesarean section     x 2  . Vaginal hysterectomy   . Tonsillectomy and adenoidectomy     at age 14  . Aortic valve replacement 02/20/2012    Procedure: AORTIC VALVE REPLACEMENT (AVR);  Surgeon: Loreli Slot, MD;  Location: [facility] OR;  Service: Open Heart Surgery;  Laterality: N/A;   HPI:  [name] is an 56 y.o. female who underwent a AO valve replacement on 02/20/12.  Intubated briefly for procedureWhile in step-down the following day 02/21/12 patient was noted to be anxious and O2 saturation on 6L [place] to be 92%. At [facility] today patient noted to have clear speech when cursing but unable to follow instructions or converse with staff.  Question of CVA given mental status, CT does not indicate acute infarct. Bedside observation with signs of aspiration, MBS warranted to determine safety with POs.      Assessment  / Plan / Recommendation Clinical Impression  Dysphagia Diagnosis: Mild pharyngeal phase dysphagia;Mild oral phase dysphagia Clinical impression: Pt presents with a mild oral dyspahgia with incomplete propulsion of bolus and oral residuals falling to valleculae and pyriforms post swallow. Solid POs not tested due to pts poor participation, however at bedside pt observed to have implusive intake, prolonged mastication and bolus formation with pocketing in the right buccal cavity. Pt presensts with mild sensory motor pharyngeal deficits with silent trace penetration of thin and pureed consistencies. Pt with a delay in swallow intiaiton and incomplete epiglottic deflection/airway protection resulting in trace silent penetration during the swallow. The quantity of penetrate is minimal and does not pose asignificant risk of aspiration though cues for an intermittent throat clear and second swallow would improve pts function with POs. At this time recommend the pt continue a dysphagia 2 diet and thin liquids with full supervision for compensatory strategies.     Treatment Recommendation  Therapy as outlined in treatment plan below    Diet Recommendation Dysphagia 2 (Fine chop);Thin liquid   Other  Recommendations     Follow Up Recommendations  Skilled Nursing facility    Frequency and Duration min 2x/week  2 weeks   Pertinent Vitals/Pain NA    SLP Swallow Goals  Patient will consume recommended diet without observed clinical signs of aspiration with: Supervision/safety Swallow Study Goal #1 - Progress: Progressing toward goal Patient will utilize recommended strategies during swallow to increase swallowing safety with: Minimal cueing Swallow Study Goal #2 - Progress: Progressing toward goal   General HPI: [name] is an 56 y.o. female who underwent a AO valve replacement on 02/20/12.  Intubated briefly for procedureWhile in step-down the following day 02/21/12 patient was noted to be anxious  and O2 saturation on 6L  to be 92%. At [facility] today patient noted to have clear speech when cursing but unable to follow instructions or converse with staff.  Question of CVA given mental status, CT does not indicate acute infarct. Bedside observation with signs of aspiration, MBS warranted to determine safety with POs.  Type of Study: Modified Barium Swallowing Study Diet Prior to this Study: Dysphagia 2 (chopped);Thin liquids Respiratory Status: Supplemental O2 delivered via (comment) History of Intubation: Yes Length of Intubations (days): 1 days Date extubated: 02/20/12 Behavior/Cognition: Alert;Confused;Uncooperative Oral Cavity - Dentition: Edentulous Oral Motor / Sensory Function: Within functional limits Vision: Functional for self-feeding Patient Positioning: Upright in chair Baseline Vocal Quality: Clear Volitional Cough: Strong Volitional Swallow: Able to elicit Anatomy: Within functional limits Pharyngeal Secretions: Not observed secondary MBS    Reason for Referral dysphagia  Oral Phase     Pharyngeal Phase Pharyngeal Phase: Impaired   Cervical Esophageal Phase Cervical Esophageal Phase: [facility]    Micha Erck, Riley Nearing 02/24/2012, 2:52 PM

## 2012-02-25 ENCOUNTER — Inpatient Hospital Stay (HOSPITAL_COMMUNITY): Payer: Medicaid Other

## 2012-02-25 LAB — CBC
HCT: 33.1 % — ABNORMAL LOW (ref 36.0–46.0)
Hemoglobin: 10.8 g/dL — ABNORMAL LOW (ref 12.0–15.0)
MCHC: 32.6 g/dL (ref 30.0–36.0)
RBC: 3.75 MIL/uL — ABNORMAL LOW (ref 3.87–5.11)

## 2012-02-25 LAB — BASIC METABOLIC PANEL
BUN: 8 mg/dL (ref 6–23)
CO2: 28 mEq/L (ref 19–32)
GFR calc non Af Amer: >90 mL/min (ref >90)
Glucose, Bld: 106 mg/dL — ABNORMAL HIGH (ref 70–99)
Potassium: 3.3 mEq/L — ABNORMAL LOW (ref 3.5–5.1)

## 2012-02-25 MED ORDER — POTASSIUM CHLORIDE 10 MEQ/50ML IV SOLN
10.0000 meq | INTRAVENOUS | Status: AC | PRN
  Administered 2012-02-25 (×3): 10 meq via INTRAVENOUS
  Filled 2012-02-25: qty 150

## 2012-02-25 MED ORDER — SODIUM CHLORIDE 0.9 % IV SOLN: 250.0000 mL | INTRAVENOUS | Status: DC | PRN

## 2012-02-25 MED ORDER — ALUM & MAG HYDROXIDE-SIMETH 200-200-20 MG/5ML PO SUSP
15.0000 mL | ORAL | Status: DC | PRN
  Administered 2012-02-26: 30 mL via ORAL
  Filled 2012-02-25: qty 30

## 2012-02-25 MED ORDER — TRAMADOL HCL 50 MG PO TABS
50.0000 mg | ORAL_TABLET | ORAL | Status: DC | PRN
  Administered 2012-02-25 – 2012-02-27 (×8): 50 mg via ORAL
  Administered 2012-02-27 (×2): 100 mg via ORAL
  Administered 2012-02-27: 50 mg via ORAL
  Administered 2012-02-28 – 2012-02-29 (×7): 100 mg via ORAL
  Administered 2012-02-29: 50 mg via ORAL
  Filled 2012-02-25 (×2): qty 2
  Filled 2012-02-25 (×2): qty 1
  Filled 2012-02-25 (×2): qty 2
  Filled 2012-02-25: qty 1
  Filled 2012-02-25 (×2): qty 2
  Filled 2012-02-25: qty 1
  Filled 2012-02-25: qty 2
  Filled 2012-02-25 (×3): qty 1
  Filled 2012-02-25: qty 2
  Filled 2012-02-25: qty 1
  Filled 2012-02-25: qty 2
  Filled 2012-02-25 (×2): qty 1

## 2012-02-25 MED ORDER — POTASSIUM CHLORIDE CRYS ER 20 MEQ PO TBCR
40.0000 meq | EXTENDED_RELEASE_TABLET | Freq: Two times a day (BID) | ORAL | Status: AC
  Administered 2012-02-25 (×2): 40 meq via ORAL
  Filled 2012-02-25 (×2): qty 2

## 2012-02-25 MED ORDER — SODIUM CHLORIDE 0.9 % IJ SOLN
3.0000 mL | Freq: Two times a day (BID) | INTRAMUSCULAR | Status: DC
  Administered 2012-02-25: 3 mL via INTRAVENOUS
  Administered 2012-02-26: 22:00:00 via INTRAVENOUS
  Administered 2012-02-26 – 2012-02-28 (×4): 3 mL via INTRAVENOUS

## 2012-02-25 MED ORDER — MOVING RIGHT ALONG BOOK
Freq: Once | Status: AC
  Administered 2012-02-27: 13:00:00
  Filled 2012-02-25: qty 1

## 2012-02-25 MED ORDER — MAGNESIUM HYDROXIDE 400 MG/5ML PO SUSP: 30.0000 mL | Freq: Every day | ORAL | Status: DC | PRN

## 2012-02-25 MED ORDER — BENZONATATE 100 MG PO CAPS
100.0000 mg | ORAL_CAPSULE | Freq: Three times a day (TID) | ORAL | Status: DC | PRN
Start: 2012-02-25 — End: 2012-02-29
  Filled 2012-02-25: qty 1

## 2012-02-25 MED ORDER — GUAIFENESIN-DM 100-10 MG/5ML PO SYRP: 15.0000 mL | ORAL_SOLUTION | ORAL | Status: DC | PRN

## 2012-02-25 MED ORDER — SODIUM CHLORIDE 0.9 % IJ SOLN: 3.0000 mL | INTRAMUSCULAR | Status: DC | PRN

## 2012-02-25 NOTE — Progress Notes (Signed)
5 Days Post-Op Procedure(s) (LRB): AORTIC VALVE REPLACEMENT (AVR) (N/A) Subjective: No specific complaints, still some confusion  Objective: Vital signs in last 24 hours: Temp:  [97.4 F (36.3 C)-98.2 F (36.8 C)] 97.4 F (36.3 C) (05/25 0711) Pulse Rate:  [53-102] 53  (05/25 0600) Cardiac Rhythm:  [-] Normal sinus rhythm (05/25 0600) Resp:  [14-31] 31  (05/25 0700) BP: (111-216)/(70-114) 173/70 mmHg (05/25 0700) SpO2:  [82 %-100 %] 98 % (05/25 0600) Weight:  [129 lb 13.6 oz (58.9 kg)] 129 lb 13.6 oz (58.9 kg) (05/25 0500)  Hemodynamic parameters for last 24 hours:    Intake/Output from previous day: 05/24 0701 - 05/25 0700 In: 1880 [P.O.:1260; I.V.:420; IV Piggyback:200] Out: 1550 [Urine:1550] Intake/Output this shift:    Neurologic: confusion persists Heart: regular rate and rhythm Lungs: clear to auscultation bilaterally Abdomen: normal findings: soft, non-tender  Lab Results:  Basename 02/25/12 0400 02/24/12 0420  WBC 6.7 8.6  HGB 10.8* 10.7*  HCT 33.1* 32.1*  PLT 224 189   BMET:  Basename 02/25/12 0400 02/24/12 0420  NA 137 135  K 3.3* 3.0*  CL 99 97  CO2 28 23  GLUCOSE 106* 102*  BUN 8 14  CREATININE 0.69 0.72  CALCIUM 9.1 8.9    PT/INR: No results found for this basename: LABPROT,INR in the last 72 hours ABG    Component Value Date/Time   PHART 7.347* 02/21/2012 0841   HCO3 19.9* 02/21/2012 0841   TCO2 21 02/21/2012 0841   ACIDBASEDEF 5.0* 02/21/2012 0841   O2SAT 92.0 02/21/2012 0841   CBG (last 3)   Basename 02/24/12 1740 02/24/12 1157 02/24/12 0735  GLUCAP 111* 136* 91    Assessment/Plan: S/P Procedure(s) (LRB): AORTIC VALVE REPLACEMENT (AVR) (N/A) Plan for transfer to step-down: see transfer orders Neuro- still some confusion and requires sitter, good strength, no focal motor deficits  CV- stable  HTN on lopressor, norvasc, lotensin  RESP- pulmonary hygiene, nicotine patch  RENAL- at preop weight, hypokalemia- supplement  Ambulated  around unit 3 times last PM  Will transfer to PTCU   LOS: 5 days    Edgerrin Correia C 02/25/2012

## 2012-02-26 LAB — BASIC METABOLIC PANEL
CO2: 21 mEq/L (ref 19–32)
Calcium: 9.4 mg/dL (ref 8.4–10.5)
Chloride: 99 mEq/L (ref 96–112)
Creatinine, Ser: 0.7 mg/dL (ref 0.50–1.10)
Glucose, Bld: 119 mg/dL — ABNORMAL HIGH (ref 70–99)

## 2012-02-26 LAB — GLUCOSE, CAPILLARY
Glucose-Capillary: 102 mg/dL — ABNORMAL HIGH (ref 70–99)
Glucose-Capillary: 97 mg/dL (ref 70–99)
Glucose-Capillary: 162 mg/dL — ABNORMAL HIGH (ref 70–99)
Glucose-Capillary: 111 mg/dL — ABNORMAL HIGH (ref 70–99)

## 2012-02-26 LAB — CBC
HCT: 37.5 % (ref 36.0–46.0)
Hemoglobin: 12.1 g/dL (ref 12.0–15.0)
MCH: 29.2 pg (ref 26.0–34.0)
MCV: 90.6 fL (ref 78.0–100.0)
RBC: 4.14 MIL/uL (ref 3.87–5.11)
WBC: 6.4 10*3/uL (ref 4.0–10.5)

## 2012-02-26 NOTE — Progress Notes (Signed)
Pt EPW pulled per protocol and as ordered all ends in tact.  Pt tolerated well bp 149/62 heart rate 73 on monitor.  Pt reminded to lie supine approximately one hour.  Will continue to monitor pt. [name], Randall An RN

## 2012-02-26 NOTE — Progress Notes (Signed)
Pt ambulated in hallway with rolling walker 150 ft and tolerated activity well. Will continue to encourage and monitor.

## 2012-02-26 NOTE — Progress Notes (Signed)
Pt ambulated 350 ft in hallway with rolling walker, pt unsteady at times. Will continue to monitor and encourage.

## 2012-02-26 NOTE — Progress Notes (Addendum)
6 Days Post-Op Procedure(s) (LRB): AORTIC VALVE REPLACEMENT (AVR) (N/A)  Subjective: [name] complains of her ribs hurting.  She states she wants 10.5 mg hydrocodone.  She is still experiencing some confusion.  Social work spoke with patients daughter yesterday regarding SNF placement options.  Objective: Vital signs in last 24 hours: Temp:  [96.9 F (36.1 C)-97.7 F (36.5 C)] 96.9 F (36.1 C) (05/25 2050) Pulse Rate:  [75-90] 75  (05/26 0409) Cardiac Rhythm:  [-] Normal sinus rhythm (05/25 2100) Resp:  [18-25] 20  (05/26 0409) BP: (118-173)/(70-103) 121/79 mmHg (05/26 0409) SpO2:  [94 %-98 %] 94 % (05/26 0409)  Intake/Output from previous day: 05/25 0701 - 05/26 0700 In: 360 [P.O.:180; I.V.:80; IV Piggyback:100] Out: 500 [Urine:500]  General appearance: alert, cooperative and no distress Heart: regular rate and rhythm Lungs: clear to auscultation bilaterally Abdomen: soft, non-tender; bowel sounds normal; no masses,  no organomegaly Extremities: edema trace Wound: clean and dry  Lab Results:  Basename 02/26/12 0510 02/25/12 0400  WBC 6.4 6.7  HGB 12.1 10.8*  HCT 37.5 33.1*  PLT 248 224   BMET:  Basename 02/26/12 0510 02/25/12 0400  NA 130* 137  K 5.1 3.3*  CL 99 99  CO2 21 28  GLUCOSE 119* 106*  BUN 10 8  CREATININE 0.70 0.69  CALCIUM 9.4 9.1    PT/INR: No results found for this basename: LABPROT,INR in the last 72 hours ABG    Component Value Date/Time   PHART 7.347* 02/21/2012 0841   HCO3 19.9* 02/21/2012 0841   TCO2 21 02/21/2012 0841   ACIDBASEDEF 5.0* 02/21/2012 0841   O2SAT 92.0 02/21/2012 0841   CBG (last 3)   Basename 02/25/12 1732 02/25/12 1123 02/25/12 0708  GLUCAP 95 92 102*    Assessment/Plan: S/P Procedure(s) (LRB): AORTIC VALVE REPLACEMENT (AVR) (N/A)  2. CV- NSR rate in the 70s, pressure controlled, will continue Metoprolol, Amlodipine, Lotensin- will d/c EPW 3. Resp- no acute issues, encouraged IS use 4. Pain- patient complaining  of pain in ribs, patient on Ultram with relief 5. Fluid Balance- renal function good, patients weight is at baseline, will continue to hold Lasix 6. Hypokalemia- resolved, repeat level 5.1 7. Deconditioning- PT/OT recs SNF 8. Dysphagia- Speech/Swallow following 9. Dispo- patient remains somewhat confused, but is stable.  Daughter is investigating current SNF options.  Patient and family with multiple social issues and I do not feel she would be safe for d/c home.  Will plan for d/c once decision can be made regarding SNF placement.   LOS: 6 days    [name], Abigail Peon 02/26/2012   Still having some confusion, but seems much more oriented to me today. SNF placement in progress

## 2012-02-27 LAB — GLUCOSE, CAPILLARY
Glucose-Capillary: 90 mg/dL (ref 70–99)
Glucose-Capillary: 111 mg/dL — ABNORMAL HIGH (ref 70–99)
Glucose-Capillary: 107 mg/dL — ABNORMAL HIGH (ref 70–99)

## 2012-02-27 MED ORDER — AMLODIPINE BESYLATE 10 MG PO TABS: 10.0000 mg | ORAL_TABLET | Freq: Every day | ORAL | Status: DC

## 2012-02-27 MED ORDER — BENAZEPRIL HCL 10 MG PO TABS: 10.0000 mg | ORAL_TABLET | Freq: Every day | ORAL | Status: DC

## 2012-02-27 MED ORDER — BENZONATATE 100 MG PO CAPS: 100.0000 mg | ORAL_CAPSULE | Freq: Three times a day (TID) | ORAL | Status: DC | PRN

## 2012-02-27 MED ORDER — TRAMADOL HCL 50 MG PO TABS: 50.0000 mg | ORAL_TABLET | ORAL | Status: DC | PRN

## 2012-02-27 MED ORDER — METOPROLOL TARTRATE 25 MG PO TABS: 25.0000 mg | ORAL_TABLET | Freq: Two times a day (BID) | ORAL | Status: DC

## 2012-02-27 MED ORDER — NICOTINE 21 MG/24HR TD PT24: 1.0000 | MEDICATED_PATCH | TRANSDERMAL | Status: DC

## 2012-02-27 NOTE — Progress Notes (Signed)
Pt Chest tube suture removed per order. Steri strips applied will monitor pt. Shloime Keilman, Randall An RN

## 2012-02-27 NOTE — Progress Notes (Signed)
Clinical Social Work  CSW reviewed chart which stated patient had already been faxed out. CSW followed up with offers. [facility] reported that they would contact CSW later to determine if any beds were open. [facility] stated they would have a bed tomorrow. CSW reviewed pasarr information which requested additional information. CSW submitted additional information for pasarr.  CSW met with patient at bedside with sitter present. At this time, patient is alert and oriented but per sitter, patient has been verbally aggressive and confused in the past. CSW spoke with patient regarding dc plans. Patient spoke about family stressors such as mom having dementia and dtr having 7 children. Patient refuses SNF and reports she will return home. Per bedside RN, patient is still unsteady and requires assistance. CSW and patient discussed patient's safety and ability to care for herself if she chooses to go home. Patient reports that dtr will assist her and refuses SNF. CSW made patient aware that SNF might not be available if she goes home and then decides she needs assistance. Patient agreeable to CSW contacting dtr to discuss options. CSW called dtr and left a message on phone with CSW contact information.  CSW will continue to follow. Sitter will need to be dc 24 hours prior to dc to SNF and pasarr number still needed before dc can occur.  [place], [place] [phone] (Coverage for Reece Levy)

## 2012-02-27 NOTE — Progress Notes (Addendum)
                    [address]            [place] [address]          [phone]     7 Days Post-Op Procedure(s) (LRB): AORTIC VALVE REPLACEMENT (AVR) (N/A)  Subjective: "I want to go home!"  Objective: Vital signs in last 24 hours: Patient Vitals for the past 24 hrs:  BP Temp Temp src Pulse Resp SpO2 Weight  02/27/12 0349 128/65 mmHg 96.6 F (35.9 C) Oral 80  20  95 % 128 lb 15.5 oz (58.5 kg)  02/26/12 2058 121/83 mmHg 96.8 F (36 C) Oral 84  20  94 % -  02/26/12 1831 - 98.3 F (36.8 C) Oral - - - -  02/26/12 1416 120/74 mmHg - - 81  20  92 % -  02/26/12 1301 149/62 mmHg - - 73  - - -   Current Weight  02/27/12 128 lb 15.5 oz (58.5 kg)     Intake/Output from previous day: 05/26 0701 - 05/27 0700 In: 480 [P.O.:480] Out: -     PHYSICAL EXAM:  Heart: RRR Lungs: clear Wound: clean and dry Extremities: mild LE edema   Lab Results: CBC: Basename 02/26/12 0510 02/25/12 0400  WBC 6.4 6.7  HGB 12.1 10.8*  HCT 37.5 33.1*  PLT 248 224   BMET:  Basename 02/26/12 0510 02/25/12 0400  NA 130* 137  K 5.1 3.3*  CL 99 99  CO2 21 28  GLUCOSE 119* 106*  BUN 10 8  CREATININE 0.70 0.69  CALCIUM 9.4 9.1    PT/INR: No results found for this basename: LABPROT,INR in the last 72 hours   Assessment/Plan: S/P Procedure(s) (LRB): AORTIC VALVE REPLACEMENT (AVR) (N/A) CV- stable, SR.  BPs controlled.  Continue current meds. Deconditioning- continue PT/OT Dysphagia- D2 diet. Disp- SNF at d/c. Hopefully medically ready soon.     LOS: 7 days    COLLINS,GINA H 02/27/2012 Confused wanting to go home today  I have seen and examined Sherle Poe and agree with the above assessment  and plan.  Delight Ovens MD [facility] [phone] Office [phone] 02/27/2012 11:29 AM

## 2012-02-27 NOTE — Progress Notes (Signed)
Speech Language Pathology Dysphagia Treatment Patient Details Name: [name] MRN: [id number] DOB: 01/03/1956 Today's Date: 02/27/2012 Time: 7829-5621 SLP Time Calculation (min): 25 min  Assessment / Plan / Recommendation Clinical Impression  Pt with improved mentation today, now oriented x4.  No overt s/s of aspiration observed, pt taking small sips with min verbal cues. Pt continues to demontrate an oral dysphagia with solids due to missing dentition, poor selective attention (distracted by environment and talking during oral prep with pocketing), and poor awareness. SLP provided max verbal cues for improved awareness and attention to POs. Pt verbalized at and of session with moderate cues. Pt unlikely to improve without full supervision. Recommend continuing Dys 2 diet and thin liquids. SLP will continue to follow.     Diet Recommendation  Continue with Current Diet: Dysphagia 2 (fine chop);Thin liquid    SLP Plan Continue with current plan of care   Pertinent Vitals/Pain NA   Swallowing Goals  SLP Swallowing Goals Patient will consume recommended diet without observed clinical signs of aspiration with: Supervision/safety Swallow Study Goal #1 - Progress: Progressing toward goal Patient will utilize recommended strategies during swallow to increase swallowing safety with: Minimal cueing Swallow Study Goal #2 - Progress: Progressing toward goal  General Temperature Spikes Noted: No Behavior/Cognition: Alert;Cooperative Patient Positioning: Upright in chair  Oral Cavity - Oral Hygiene Does patient have any of the following "at risk" factors?: None of the above   Dysphagia Treatment Treatment focused on: Skilled observation of diet tolerance;Utilization of compensatory strategies Treatment Methods/Modalities: Skilled observation Patient observed directly with PO's: Yes Type of PO's observed: Thin liquids;Dysphagia 3 (soft) Feeding: Able to feed self Liquids provided via:  Cup Oral Phase Signs & Symptoms: Left pocketing;Prolonged mastication;Prolonged bolus formation;Prolonged oral phase Type of cueing: Verbal Amount of cueing: Moderate  [name], [place] CCC-SLP [phone]   [name] 02/27/2012, 10:59 AM

## 2012-02-27 NOTE — Discharge Summary (Signed)
[address]            [name] [address]          [phone]         Discharge Summary  Name: [name] DOB: 06-28-1956 56 y.o. MRN: [id number]  Admission Date: 02/20/2012 Discharge Date:    Admitting Diagnosis: Severe aortic stenosis  Discharge Diagnosis:  Severe aortic stenosis Dyslipidemia Hypothyroidism Chronic back pain Gastroesophageal reflux disease/peptic ulcer disease Depression History of Barrett's esophagus History of seizures as a child Tobacco abuse Hypertension Postoperative delirium Postoperative dysphagia   Procedures: Procedure(s): AORTIC VALVE REPLACEMENT (#19 Magna Ease pericardial tissue valve) on 02/20/2012   HPI:  The patient is a 56 y.o. female with known aortic stenosis which has been severe since 2011. She initially had been evaluated for surgery in [facility] by Dr. Marrian Salvage and Dr. Remo Lipps. She has severe aortic stenosis with a mean gradient of 36 mmHg and a peak gradient of 57 mm mercury. Aortic valve replacement was recommended but she would not commit to having surgery. About 6 months later in October Dr. Dorris Fetch saw her for the same problem and once again emphasized the importance of aortic valve replacement for critical aortic stenosis.She was scheduled for surgery, however on multiple occasions she cancelled the procedure just prior to the surgery date. The final contact was in November of 2011 after canceling surgery due to gastrointestinal symptoms. She was asked to call the office when she felt she was ready for surgery. At the end of January of this year, she presented with worsening chest pain and shortness of breath to [facility]. Echocardiogram showed a mean gradient of 56 mmHg and a peak gradient of 97 mmHg and a valve area calculated at 0.7 cm2. She saw Dr. Julien Nordmann in followup and was referred back to Dr. Dorris Fetch for surgery.  All risks, benefits and alternatives of  surgery were explained in detail, and the patient agreed to proceed.     Hospital Course:  The patient was admitted to [facility] on 02/20/2012. The patient was taken to the operating room and underwent the above procedure.    The postoperative course was notable for delirium and altered mental status. A neurology consult was obtained and a CT scan of the head was performed which revealed hypodensities of the frontal lobe concerning for ischemia or infarct, age undetermined. All labs were normal. She was treated conservatively, and her mental status is improving daily. She has been hypertensive, and has been started on Lopressor, Norvasc and Lotensin with improved blood pressure control. She has been followed by speech pathologist for dysphagia and currently is tolerating a dysphagia 2 diet with thin liquids without difficulty. She has been afebrile and vital signs are stable. She still has episodes of confusion, and it is felt that she will benefit from a brief stay in a skilled nursing facility for further rehabilitation prior to discharge home. We anticipate that she will be medically ready within the next 24-48 hours once a bed is available.    Recent vital signs:  Filed Vitals:   02/27/12 0954  BP: 123/66  Pulse: 81  Temp:   Resp:     Recent laboratory studies:  CBC: Basename 02/26/12 0510 02/25/12 0400  WBC 6.4 6.7  HGB 12.1 10.8*  HCT 37.5 33.1*  PLT 248 224   BMET:  Basename 02/26/12 0510 02/25/12  0400  NA 130* 137  K 5.1 3.3*  CL 99 99  CO2 21 28  GLUCOSE 119* 106*  BUN 10 8  CREATININE 0.70 0.69  CALCIUM 9.4 9.1    PT/INR: No results found for this basename: LABPROT,INR in the last 72 hours  Discharge Medications:   Medication List  As of 02/27/2012 11:23 AM   STOP taking these medications         SLEEP AID PO         TAKE these medications         amLODipine 10 MG tablet   Commonly known as: NORVASC   Take 1 tablet (10 mg total) by mouth daily.       aspirin 325 MG tablet   Take 325 mg by mouth daily.      benazepril 10 MG tablet   Commonly known as: LOTENSIN   Take 1 tablet (10 mg total) by mouth daily.      benzonatate 100 MG capsule   Commonly known as: TESSALON   Take 1 capsule (100 mg total) by mouth 3 (three) times daily as needed for cough.      levothyroxine 50 MCG tablet   Commonly known as: SYNTHROID, LEVOTHROID   Take 50 mcg by mouth daily.      metoprolol tartrate 25 MG tablet   Commonly known as: LOPRESSOR   Take 1 tablet (25 mg total) by mouth 2 (two) times daily.      nicotine 21 mg/24hr patch   Commonly known as: NICODERM CQ - dosed in mg/24 hours   Place 1 patch onto the skin daily.      omeprazole 20 MG capsule   Commonly known as: PRILOSEC   Take 20 mg by mouth daily.      traMADol 50 MG tablet   Commonly known as: ULTRAM   Take 1-2 tablets (50-100 mg total) by mouth every 4 (four) hours as needed for pain.            Discharge Instructions:  The patient is to refrain from driving, heavy lifting or strenuous activity.  May shower daily and clean incisions with soap and water.  May continue dysphagia 2 diet with thin liquids.   Follow-up Information    Follow up with Julien Nordmann, MD. Schedule an appointment as soon as possible for a visit in 2 weeks.   Contact information:   [address] [place] [address] [phone]       Follow up with Loreli Slot, MD. (Office will schedule appointment)    Contact information:   [address] [place] [address] [phone]              Ersel Wadleigh H 02/27/2012, 11:23 AM

## 2012-02-27 NOTE — Progress Notes (Signed)
Pt ambulated, 300 feet x 1 assist, slightly unsteady gait. Pt tolerated well will monitor patient. Lauris Serviss, Randall An RN

## 2012-02-27 NOTE — Progress Notes (Signed)
Pt ambulated 150 feet x1 assist,  Gait still unsteady. Pt tolerated well  Will continue to monitor. Kazuto Sevey, Randall An RN

## 2012-02-27 NOTE — Clinical Social Work Placement (Signed)
     Clinical Social Work Department CLINICAL SOCIAL WORK PLACEMENT NOTE 02/27/2012  Patient:  [name], [name]  Account Number:  [id number] Admit date:  02/20/2012  Clinical Social Worker:  Unk Lightning, LCSW  Date/time:  02/27/2012 01:45 PM  Clinical Social Work is seeking post-discharge placement for this patient at the following level of care:   SKILLED NURSING   (*CSW will update this form in Epic as items are completed)   02/27/2012  Patient/family provided with [facility] Department of Clinical Social Works list of facilities offering this level of care within the geographic area requested by the patient (or if unable, by the patients family).  02/27/2012  Patient/family informed of their freedom to choose among providers that offer the needed level of care, that participate in Medicare, Medicaid or managed care program needed by the patient, have an available bed and are willing to accept the patient.  02/27/2012  Patient/family informed of [facility] ownership interest in [facility], as well as of the fact that they are under no obligation to receive care at this facility.  PASARR submitted to [employer] on 02/22/2012 PASARR number received from [employer] on   FL2 transmitted to all facilities in geographic area requested by pt/family on  02/27/2012 FL2 transmitted to all facilities within larger geographic area on   Patient informed that his/her managed care company has contracts with or will negotiate with  certain facilities, including the following:     Patient/family informed of bed offers received:   Patient chooses bed at  Physician recommends and patient chooses bed at    Patient to be transferred to  on   Patient to be transferred to facility by   The following physician request were entered in Epic:   Additional Comments:

## 2012-02-27 NOTE — Progress Notes (Signed)
Agree with the above.  Thana Farr, MD [facility] [phone]  02/27/2012  5:47 PM

## 2012-02-27 NOTE — Progress Notes (Signed)
Pt ambulated 500 feet x1 assist, with unsteady gait, pt leaning to right, but tolerated well no complaints pt back in room call bell in reach, reminded to call before she got up.  Will monitor pt. [name], Randall An RN

## 2012-02-28 LAB — BASIC METABOLIC PANEL
Calcium: 9.2 mg/dL (ref 8.4–10.5)
GFR calc Af Amer: 87 mL/min — ABNORMAL LOW (ref >90)
GFR calc non Af Amer: 75 mL/min — ABNORMAL LOW (ref >90)
Glucose, Bld: 92 mg/dL (ref 70–99)
Potassium: 4.2 mEq/L (ref 3.5–5.1)
Sodium: 129 mEq/L — ABNORMAL LOW (ref 135–145)

## 2012-02-28 LAB — GLUCOSE, CAPILLARY: Glucose-Capillary: 86 mg/dL (ref 70–99)

## 2012-02-28 NOTE — Care Management Note (Signed)
    Page 1 of 1   02/29/2012     4:04:11 PM   CARE MANAGEMENT NOTE 02/29/2012  Patient:  [name], [name]   Account Number:  [id number]  Date Initiated:  02/28/2012  Documentation initiated by:  SIMMONS,CRYSTAL  Subjective/Objective Assessment:   ADMITTED WITH AORTIC STENOSIS; S/P AVR ON 5/20.     Action/Plan:   DISCHARGE PLANNING   Anticipated DC Date:  02/29/2012   Anticipated DC Plan:  SKILLED NURSING FACILITY  In-house referral  Clinical Social Worker      DC Planning Services  CM consult      Choice offered to / List presented to:             Status of service:  Completed, signed off Medicare Important Message given?   (If response is "NO", the following Medicare IM given date fields will be blank) Date Medicare IM given:   Date Additional Medicare IM given:    Discharge Disposition:  HOME/SELF CARE  Per UR Regulation:  Reviewed for med. necessity/level of care/duration of stay  If discussed at Long Length of Stay Meetings, dates discussed:   02/29/2012    Comments:  02/29/12 Ynez Eugenio,RN,BSN [phone] CSW MET WITH PT ONE LAST TIME: SHE ADAMANTLY REFUSES DC TO SNF, AND WILL ONLY AGREE TO DC HOME.  MD AND PA IN, WROTE FOR DC AND HOME HEALTH PT AND RN.  MET WITH PT TO [facility].  SHE AGAIN ADAMANTLY REFUSES HOME HEALTH CARE, STATING :  "I'VE HAD THAT BEFORE AND THEY DIDN'T DO ANYTHING!  I TAKE CARE OF MYSELF, I DON'T NEED A BUNCH OF PEOPLE COMING IN MY HOUSE."  NOTIFIED WAYNE GOLD, PA OF PT'S REFUSAL FOR HH SERVICES.  NO NEW ORDERS--DC TO GO FORWARD AS PLANNED.  02/28/12  1524   CRYSTAL SIMMONS RN, BSN [phone] PT NOW REFUSING SNF PLACEMENT; HOWEVER, SHE IS CONFUSED; CONSULTED WITH WAYNE GOLD ABOUT POSSIBLE PSYCH C/S FOR COMPETENCY- HE STATED THIS WOULD CLEAR.  CSW C/S APS FOR HOME CONDITIONS.  NCM WILL FOLLOW.

## 2012-02-28 NOTE — Progress Notes (Signed)
Pt ambulated 300 ft with assistance. No oxygen needed. Pt tolerated walk well. Pt returned to chair with call bell in reach. Vital signs checked and are stable. Will continue to monitor. Steva Colder [facility] 02/28/2012 5:55 AM

## 2012-02-28 NOTE — Progress Notes (Signed)
CSW and RNs met with pt re: SNF placement. Pt adamantly refuses SNF and state, " I have 24 hour care at home, I already told you." Pt alert and oriented x4. CSW and RNs voiced safety concern over pt d/c home. Pt verbalized understanding of concern, but still declined SNF offer and reported she will d/c home. CSW suggested HH, but pt stated, "No way, [facility] tried that junk. No way!." Pt then reported to CSW that her home was "in bad shape with roaches and all" and that she did not want HH workers to see the shape of her home for fear of reporting unsafe living conditions. CSW to make APS report to ensure living conditions are safe for pt, pt's mother, and pt's son. No other CSW needs identified. CSW signing off. Dellie Burns, MSW, [place] [phone] (coverage)

## 2012-02-28 NOTE — Progress Notes (Addendum)
CSW contacted by pt's dtr, Efraim Kaufmann [phone], and reported pt now agreeable to ST SNF at [facility] as she knows a friend who works there. CSW discussed this with pt and pt still adamantly refusing SNF. Pt stated, "I'm not going to any damn rehab. I will walk out of here if I have to. I'm not crazy." Pt and pt's dtr aware pt will need 30 days in SNF as she is medicaid only. Pt is up for discussion at long LOS tomorrow. Covering CSW for 2000 will f/u if pt still here tomorrow. CSW provided RN with non-emergency ambulance form and contact number in event pt should d/c home later today. Dellie Burns, MSW, LCSWA [phone] (covering)

## 2012-02-28 NOTE — Progress Notes (Signed)
Pt is a 1 ppd smoker who verbalizes understanding to the risk factors of smoking as discussed. Pt is in precontemplation stage and doesn't want to quit.

## 2012-02-28 NOTE — Progress Notes (Signed)
[address]            [place] [address]          [phone]     8 Days Post-Op  Procedure(s) (LRB): AORTIC VALVE REPLACEMENT (AVR) (N/A) Subjective: Nursing reports she has been difficult due too confusion. Currently she is fully oriented  Objective  Telemetry sinus rhythm  Temp:  [97.7 F (36.5 C)-98.7 F (37.1 C)] 97.7 F (36.5 C) (05/28 0530) Pulse Rate:  [67-81] 67  (05/28 0530) Resp:  [18-19] 19  (05/28 0530) BP: (92-123)/(61-68) 92/61 mmHg (05/28 0530) SpO2:  [90 %-95 %] 90 % (05/28 0530) Weight:  [128 lb 8.5 oz (58.3 kg)] 128 lb 8.5 oz (58.3 kg) (05/28 0530)   Intake/Output Summary (Last 24 hours) at 02/28/12 0753 Last data filed at 02/27/12 1700  Gross per 24 hour  Intake    600 ml  Output      0 ml  Net    600 ml       General appearance: alert, cooperative and no distress Heart: regular rate and rhythm and S1, S2 normal Lungs: diminished in bases Abdomen: soft, nontender Extremities: no edema Wound: incision healing well  Lab Results:  Basename 02/28/12 0530 02/26/12 0510  NA 129* 130*  K 4.2 5.1  CL 92* 99  CO2 26 21  GLUCOSE 92 119*  BUN 11 10  CREATININE 0.85 0.70  CALCIUM 9.2 9.4  MG -- --  PHOS -- --   No results found for this basename: AST:2,ALT:2,ALKPHOS:2,BILITOT:2,PROT:2,ALBUMIN:2 in the last 72 hours No results found for this basename: LIPASE:2,AMYLASE:2 in the last 72 hours  Basename 02/26/12 0510  WBC 6.4  NEUTROABS --  HGB 12.1  HCT 37.5  MCV 90.6  PLT 248   No results found for this basename: CKTOTAL:4,CKMB:4,TROPONINI:4 in the last 72 hours No components found with this basename: POCBNP:3 No results found for this basename: DDIMER in the last 72 hours No results found for this basename: HGBA1C in the last 72 hours No results found for this basename: CHOL,HDL,LDLCALC,TRIG,CHOLHDL in the last 72 hours No results found for this basename: TSH,T4TOTAL,FREET3,T3FREE,THYROIDAB in the  last 72 hours No results found for this basename: VITAMINB12,FOLATE,FERRITIN,TIBC,IRON,RETICCTPCT in the last 72 hours  Medications: Scheduled    . amLODipine  10 mg Oral Daily  . aspirin EC  325 mg Oral Daily  . benazepril  10 mg Oral Daily  . bisacodyl  10 mg Oral Daily   Or  . bisacodyl  10 mg Rectal Daily  . docusate sodium  200 mg Oral Daily  . insulin aspart  0-15 Units Subcutaneous TID WC  . levothyroxine  50 mcg Oral QAC breakfast  . metoprolol tartrate  25 mg Oral BID  . moving right along book   Does not apply Once  . nicotine  21 mg Transdermal Q24H  . pantoprazole  40 mg Oral Q1200  . sodium chloride  3 mL Intravenous Q12H     Radiology/Studies:  No results found.  INR: Will add last result for INR, ABG once components are confirmed Will add last 4 CBG results once components are confirmed  Assessment/Plan: S/P Procedure(s) (LRB): AORTIC VALVE REPLACEMENT (AVR) (N/A) 1. She appears medically stable for discharge to skilled nursing but probably not safe for discharge home with confusion and weakness   LOS: 8 days    Eretria Manternach E 5/28/20137:53 AM

## 2012-02-28 NOTE — Progress Notes (Signed)
CARDIAC REHAB PHASE I   PRE:  Rate/Rhythm: 75SR  BP:  Supine: 113/69  Sitting:   Standing:    SaO2: 90%RA  MODE:  Ambulation: 550 ft   POST:  Rate/Rhythem: 90SR  BP:  Supine:   Sitting: 114/58  Standing:    SaO2: 94%RA 0800-0835 Pt would not use walker to walk. Held my hand. Wobbly at times. States she is going home. We will educate with family if for d/c. Walked 550 ft with asst x 1. Did not want to sit in recliner. Back to bed with bed alarm.  Duanne Limerick

## 2012-02-28 NOTE — Progress Notes (Signed)
[id number] Pt's family members not here and pt may be leaving today. Tried to ed pt. Refuses to stop smoking or to limit salt intake. Was able to repeat sternal precautions . Pt does not know how much walking she can do after d/c but she has written ex ed to refer to. Stressed to pt she is to walk with asst only.  Pt knows she is to start at 5 mins after d/c.but will need reenforcement of progression. Declined CRP 2. Does not want to use IS but discussed use. Genevia Bouldin DunlapRN

## 2012-02-29 LAB — GLUCOSE, CAPILLARY: Glucose-Capillary: 105 mg/dL — ABNORMAL HIGH (ref 70–99)

## 2012-02-29 MED ORDER — BENAZEPRIL HCL 10 MG PO TABS: 10.0000 mg | ORAL_TABLET | Freq: Every day | ORAL | Status: DC

## 2012-02-29 MED ORDER — TRAMADOL HCL 50 MG PO TABS: 50.0000 mg | ORAL_TABLET | Freq: Four times a day (QID) | ORAL | Status: AC | PRN

## 2012-02-29 MED ORDER — METOPROLOL TARTRATE 25 MG PO TABS: 25.0000 mg | ORAL_TABLET | Freq: Two times a day (BID) | ORAL | Status: DC

## 2012-02-29 MED ORDER — NICOTINE 21 MG/24HR TD PT24: 1.0000 | MEDICATED_PATCH | TRANSDERMAL | Status: AC

## 2012-02-29 MED ORDER — AMLODIPINE BESYLATE 10 MG PO TABS: 10.0000 mg | ORAL_TABLET | Freq: Every day | ORAL | Status: DC

## 2012-02-29 MED ORDER — OMEPRAZOLE 20 MG PO CPDR: 20.0000 mg | DELAYED_RELEASE_CAPSULE | Freq: Every day | ORAL | Status: AC

## 2012-02-29 MED ORDER — BENZONATATE 100 MG PO CAPS: 100.0000 mg | ORAL_CAPSULE | Freq: Three times a day (TID) | ORAL | Status: AC | PRN

## 2012-02-29 MED ORDER — LEVOTHYROXINE SODIUM 50 MCG PO TABS: 50.0000 ug | ORAL_TABLET | Freq: Every day | ORAL | Status: AC

## 2012-02-29 NOTE — Progress Notes (Signed)
Pt DCing home with family.  All instructions and prescriptions given and reviewed, all questions answered.

## 2012-02-29 NOTE — Progress Notes (Signed)
9 Days Post-Op Procedure(s) (LRB): AORTIC VALVE REPLACEMENT (AVR) (N/A) Subjective: "i want to go home"  Objective: Vital signs in last 24 hours: Temp:  [97.9 F (36.6 C)-98.3 F (36.8 C)] 97.9 F (36.6 C) (05/29 0609) Pulse Rate:  [67-74] 67  (05/29 0609) Cardiac Rhythm:  [-] Normal sinus rhythm (05/29 0730) Resp:  [18-20] 18  (05/29 0609) BP: (100-112)/(59-76) 100/65 mmHg (05/29 0609) SpO2:  [90 %-93 %] 90 % (05/29 0609) Weight:  [127 lb 3.3 oz (57.7 kg)] 127 lb 3.3 oz (57.7 kg) (05/29 0609)  Hemodynamic parameters for last 24 hours:    Intake/Output from previous day: 05/28 0701 - 05/29 0700 In: 240 [P.O.:240] Out: -  Intake/Output this shift:    General appearance: alert and no distress Neurologic: intact Heart: regular rate and rhythm Lungs: clear to auscultation bilaterally Wound: clean and dry  Lab Results: No results found for this basename: WBC:2,HGB:2,HCT:2,PLT:2 in the last 72 hours BMET:  Basename 02/28/12 0530  NA 129*  K 4.2  CL 92*  CO2 26  GLUCOSE 92  BUN 11  CREATININE 0.85  CALCIUM 9.2    PT/INR: No results found for this basename: LABPROT,INR in the last 72 hours ABG    Component Value Date/Time   PHART 7.347* 02/21/2012 0841   HCO3 19.9* 02/21/2012 0841   TCO2 21 02/21/2012 0841   ACIDBASEDEF 5.0* 02/21/2012 0841   O2SAT 92.0 02/21/2012 0841   CBG (last 3)   Basename 02/29/12 0618 02/28/12 2002 02/28/12 1638  GLUCAP 105* 86 96    Assessment/Plan: S/P Procedure(s) (LRB): AORTIC VALVE REPLACEMENT (AVR) (N/A) Plan for discharge: see discharge orders She is medically ready for d/c to SNF. Apparently there is a SNF in [facility] that has been contacted, but the patient says she would rather "go to the one in [place]".  D/C later today if arrangements can be made Her CBGs are normal- d/c CBG/ SSI   LOS: 9 days    Yolani Vo C 02/29/2012

## 2012-02-29 NOTE — Discharge Instructions (Signed)
Aortic Valve Replacement Care After Read the instructions outlined below and refer to this sheet for the next few weeks. These discharge instructions provide you with general information on caring for yourself after you leave the hospital. Your surgeon may also give you specific instructions. While your treatment has been planned according to the most current medical practices available, unavoidable complications occasionally occur. If you have any problems or questions after discharge, please call your surgeon. AFTER THE PROCEDURE  Full recovery from heart valve surgery can take several months.   Blood thinning (anticoagulation) treatment with warfarin is often prescribed for 6 weeks to 3 months after surgery for those with biological valves. It is prescribed for life for those with mechanical valves.   Recovery includes healing of the surgical incision. There is a gradual building of stamina and exercise abilities. An exercise program under the direction of a physical therapist may be recommended.   Once you have an artificial valve, your heart function and your life will return to normal. You usually feel better after surgery. Shortness of breath and fatigue should lessen. If your heart was already severely damaged before your surgery, you may continue to have problems.   You can usually resume most of your normal activities. You will have to continue to monitor your condition. You need to watch out for blood clots and infections.   Artificial valves need to be replaced after a period of time. It is important that you see your caregiver regularly.   Some individuals with an aortic valve replacement need to take antibiotics before having dental work or other surgical procedures. This is called prophylactic antibiotic treatment. These drugs help to prevent infective endocarditis. Antibiotics are only recommended for individuals with the highest risk for developing infective endocarditis. Let your  dentist and your caregiver know if you have a history of any of the following so that the necessary precautions can be taken:   A VSD.   A repaired VSD.   Endocarditis in the past.   An artificial (prosthetic) heart valve.  HOME CARE INSTRUCTIONS   Use all medications as prescribed.   Take your temperature every morning for the first week after surgery. Record these.   Weigh yourself every morning for at least the first week after surgery and record.   Do not lift more than 10 pounds (4.5 kg) until your breastbone (sternum) has healed. Avoid all activities which would place strain on your incision.   You may shower as soon as directed by your caregiver after surgery. Pat incisions dry. Do not rub incisions with washcloth or towel.   Avoid driving for 4 to 6 weeks following surgery or as instructed.   Use your elastic stockings during the day. You should wear the stockings for at least 2 weeks after discharge or longer if your ankles are swollen. The stockings help blood flow and help reduce swelling in the legs. It is easiest to put the stockings on before you get out of bed in the morning. They should fit snugly.  Pain Control  If a prescription was given for a pain reliever, please follow your doctor's directions.   If the pain is not relieved by your medicine, becomes worse, or you have difficulty breathing, call your surgeon.  Activity  Take frequent rest periods throughout the day.   Wait one week before returning to strenuous activities such as heavy lifting (more than 10 pounds), pushing or pulling.   Talk with your doctor about when you may   return to work and your exercise routine.   Do not drive while taking prescription pain medication.  Nutrition  You may resume your normal diet.   Drink plenty of fluids (6-8 glasses a day).   Eat a well-balanced diet.   Call your caregiver for persistent nausea or vomiting.  Elimination Your normal bowel function should  return. If constipation should occur, you may:  Take a mild laxative.   Add fruit and bran to your diet.   Drink more fluids.   Call your doctor if constipation is not relieved.  SEEK IMMEDIATE MEDICAL CARE IF:   You develop chest pain which is not coming from your surgical cut (incision).   You develop shortness of breath or have difficulty breathing.   You develop a temperature over 101 F (38.3 C).   You have a sudden weight gain. Let your caregiver know what the weight gain is.   You develop a rash.   You develop any reaction or side effects to medications given.   You have increased bleeding from wounds.   You see redness, swelling, or have increasing pain in wounds.   You have pus coming from your wound.   You develop lightheadedness or feel faint.  Document Released: 04/07/2005 Document Revised: 09/08/2011 Document Reviewed: 06/29/2005 Exit[employer] Patient Information 2012 [employer]. 

## 2012-02-29 NOTE — Progress Notes (Signed)
                   [address]            [place] [address]          [phone]     9 Days Post-Op  Procedure(s) (LRB): AORTIC VALVE REPLACEMENT (AVR) (N/A) Subjective: Less confusion overnight per nursing. Reportedly substantial issues about home safety/ cleanliness .  It is not clear as to whether she is agreeing to short term SNF as she keeps changing her mind. Objective  Telemetry sinus rhythm  Temp:  [97.9 F (36.6 C)-98.3 F (36.8 C)] 97.9 F (36.6 C) (05/29 0609) Pulse Rate:  [67-74] 67  (05/29 0609) Resp:  [18-20] 18  (05/29 0609) BP: (100-112)/(59-76) 100/65 mmHg (05/29 0609) SpO2:  [90 %-93 %] 90 % (05/29 0609) Weight:  [127 lb 3.3 oz (57.7 kg)] 127 lb 3.3 oz (57.7 kg) (05/29 0609)   Intake/Output Summary (Last 24 hours) at 02/29/12 0805 Last data filed at 02/28/12 1700  Gross per 24 hour  Intake    240 ml  Output      0 ml  Net    240 ml       General appearance: alert, cooperative and no distress Heart: regular rate and rhythm and S1, S2 normal Lungs: dim in bases Abdomen: soft, nontender Extremities: min edema Wound: incis healing well  Lab Results:  Basename 02/28/12 0530  NA 129*  K 4.2  CL 92*  CO2 26  GLUCOSE 92  BUN 11  CREATININE 0.85  CALCIUM 9.2  MG --  PHOS --   No results found for this basename: AST:2,ALT:2,ALKPHOS:2,BILITOT:2,PROT:2,ALBUMIN:2 in the last 72 hours No results found for this basename: LIPASE:2,AMYLASE:2 in the last 72 hours No results found for this basename: WBC:2,NEUTROABS:2,HGB:2,HCT:2,MCV:2,PLT:2 in the last 72 hours No results found for this basename: CKTOTAL:4,CKMB:4,TROPONINI:4 in the last 72 hours No components found with this basename: POCBNP:3 No results found for this basename: DDIMER in the last 72 hours No results found for this basename: HGBA1C in the last 72 hours No results found for this basename: CHOL,HDL,LDLCALC,TRIG,CHOLHDL in the last 72 hours No results found for this  basename: TSH,T4TOTAL,FREET3,T3FREE,THYROIDAB in the last 72 hours No results found for this basename: VITAMINB12,FOLATE,FERRITIN,TIBC,IRON,RETICCTPCT in the last 72 hours  Medications: Scheduled    . amLODipine  10 mg Oral Daily  . aspirin EC  325 mg Oral Daily  . benazepril  10 mg Oral Daily  . bisacodyl  10 mg Oral Daily   Or  . bisacodyl  10 mg Rectal Daily  . docusate sodium  200 mg Oral Daily  . insulin aspart  0-15 Units Subcutaneous TID WC  . levothyroxine  50 mcg Oral QAC breakfast  . metoprolol tartrate  25 mg Oral BID  . nicotine  21 mg Transdermal Q24H  . pantoprazole  40 mg Oral Q1200  . sodium chloride  3 mL Intravenous Q12H     Radiology/Studies:  No results found.  INR: Will add last result for INR, ABG once components are confirmed Will add last 4 CBG results once components are confirmed  Assessment/Plan: S/P Procedure(s) (LRB): AORTIC VALVE REPLACEMENT (AVR) (N/A)  Doing better, disposition pending. Concern environment is unsafe with roaches, etc. May need home to be inspected before decision can be made  LOS: 9 days    Taia Bramlett E 5/29/20138:05 AM

## 2012-02-29 NOTE — Progress Notes (Signed)
CARDIAC REHAB PHASE I   PRE:  Rate/Rhythm: 83SR  BP:  Supine:   Sitting: 115/56  Standing:    SaO2: 90%RA  MODE:  Ambulation: 650 ft   POST:  Rate/Rhythem: 89SR  BP:  Supine:   Sitting: 115/56  Standing:    SaO2: 92%RA 1045-1110 Pt walked 650 ft on RA with handheld asst. Tolerated well. Gait fairly steady. Tried to review walking instructions and encouraged walking with asst  Once discharged. To recliner with seat alarm and call bell. Pt talkative during walk and no SOB noted.  Duanne Limerick

## 2012-02-29 NOTE — Clinical Social Work Note (Signed)
Pt was discussed in Long Length of Stay Meeting.   Pt has been referred for a Level 2 PASARR #. CSW spoke with Kingsley Callander with the state, and she will be assessing pt today, late this afternoon. CSW will put needed documents in [facility] for PASARR.    CSW met with pt to address discharge plan. Pt declined SNF. CSW spoke with pt's daughter regarding discharge plan, who shared that her preference is for pt to go to SNF, however is agreeable to pt staying with her after discharge. CSW updated PA and RNCM of dispo.   CSW spoke with RN regarding discharge home. CSW is signing off as no further needs identified.   Dede Query, MSW, [facility]res[facility] Majors [phone]

## 2012-02-29 NOTE — Discharge Summary (Signed)
                   [address]            [name] [address]          [phone]   Addendum: The patient has remained clinically quite stable. Initially it was felt the patient should be discharged to a skilled nursing facility. The patient continues to refuse this and arrangements are being made for the patient to have home health nursing and physical therapy at her daughter's house. When these arrangements are finalized she can be discharged home.   Current home medication list:   Medication List  As of 02/29/2012  2:59 PM   STOP taking these medications         SLEEP AID PO         TAKE these medications         amLODipine 10 MG tablet   Commonly known as: NORVASC   Take 1 tablet (10 mg total) by mouth daily.      aspirin 325 MG tablet   Take 325 mg by mouth daily.      benazepril 10 MG tablet   Commonly known as: LOTENSIN   Take 1 tablet (10 mg total) by mouth daily.      benzonatate 100 MG capsule   Commonly known as: TESSALON   Take 1 capsule (100 mg total) by mouth 3 (three) times daily as needed for cough.      levothyroxine 50 MCG tablet   Commonly known as: SYNTHROID, LEVOTHROID   Take 50 mcg by mouth daily.      metoprolol tartrate 25 MG tablet   Commonly known as: LOPRESSOR   Take 1 tablet (25 mg total) by mouth 2 (two) times daily.      nicotine 21 mg/24hr patch   Commonly known as: NICODERM CQ - dosed in mg/24 hours   Place 1 patch onto the skin daily.      omeprazole 20 MG capsule   Commonly known as: PRILOSEC   Take 20 mg by mouth daily.      traMADol 50 MG tablet   Commonly known as: ULTRAM   Take 1-2 tablets (50-100 mg total) by mouth every 4 (four) hours as needed for pain.

## 2012-03-07 ENCOUNTER — Encounter: Payer: Self-pay | Admitting: Cardiovascular Disease

## 2012-03-12 ENCOUNTER — Other Ambulatory Visit: Payer: Self-pay | Admitting: Surgical

## 2012-03-15 ENCOUNTER — Other Ambulatory Visit: Payer: Self-pay | Admitting: Thoracic Surgery (Cardiothoracic Vascular Surgery)

## 2012-03-20 ENCOUNTER — Other Ambulatory Visit: Payer: Self-pay | Admitting: Thoracic Surgery (Cardiothoracic Vascular Surgery)

## 2012-03-20 ENCOUNTER — Encounter: Payer: Self-pay | Admitting: Thoracic Surgery (Cardiothoracic Vascular Surgery)

## 2012-03-20 ENCOUNTER — Ambulatory Visit
Admission: RE | Admit: 2012-03-20 | Discharge: 2012-03-20 | Disposition: A | Discharge status: Home / Self Care | Payer: Medicaid Other | Source: Ambulatory Visit | Attending: Thoracic Surgery (Cardiothoracic Vascular Surgery) | Admitting: Thoracic Surgery (Cardiothoracic Vascular Surgery)

## 2012-03-20 ENCOUNTER — Ambulatory Visit (INDEPENDENT_AMBULATORY_CARE_PROVIDER_SITE_OTHER): Payer: Self-pay | Admitting: Thoracic Surgery (Cardiothoracic Vascular Surgery)

## 2012-03-20 VITALS — BP 113/72 | HR 64 | Resp 16 | Ht 60.0 in | Wt 121.5 lb

## 2012-03-20 DIAGNOSIS — I359 Nonrheumatic aortic valve disorder, unspecified: Secondary | ICD-10-CM

## 2012-03-20 DIAGNOSIS — I350 Nonrheumatic aortic (valve) stenosis: Secondary | ICD-10-CM

## 2012-03-20 DIAGNOSIS — Z09 Encounter for follow-up examination after completed treatment for conditions other than malignant neoplasm: Secondary | ICD-10-CM

## 2012-03-20 NOTE — Progress Notes (Signed)
HPI:  [name] returns for a scheduled followup visit. She had aortic valve replacement with a pericardial valve on May 20. She had a difficult initial postoperative course with delirium, which finally resolved. Says that she has been walking, but is unable to quantitate it. She does have some incisional discomfort and is still taking pain pills. She complains of feeling paranoid especially at home,and this dates back to February of this year, prior to her surgery.  Past Medical History  Diagnosis Date  . Smoking history   . Aortic valve stenosis   . Back pain, chronic   . Chest pain   . SOB (shortness of breath)   . Coronary artery disease     mild; non-hemodynamically significant  . CHF (congestive heart failure)   . Dyslipidemia   . Thyroid disease     hypothyroidism  . Barrett esophagus   . GERD (gastroesophageal reflux disease)     with PUD  . PUD (peptic ulcer disease)   . Depression   . Deaf   . Sinus congestion   . Wheezing   . Rash, skin     2 weeks ago, bilateral hand rash, peeling, eruptions   . Seizures     as a child at age 32  . Hypothyroidism   . Hypertension   . Arthritis     back, hips, legs  . PONV (postoperative nausea and vomiting)      Current Outpatient Prescriptions  Medication Sig Dispense Refill  . amLODipine (NORVASC) 10 MG tablet Take 1 tablet (10 mg total) by mouth daily.  30 tablet  1  . aspirin 325 MG tablet Take 325 mg by mouth daily.      . benazepril (LOTENSIN) 10 MG tablet Take 1 tablet (10 mg total) by mouth daily.  30 tablet  1  . benzonatate (TESSALON) 200 MG capsule TAKE ONE CAPSULE BY MOUTH 3 TIMES A DAY AS NEEDED FOR COUGH  20 capsule  0  . levothyroxine (SYNTHROID, LEVOTHROID) 50 MCG tablet Take 1 tablet (50 mcg total) by mouth daily.  30 tablet  1  . metoprolol tartrate (LOPRESSOR) 25 MG tablet Take 1 tablet (25 mg total) by mouth 2 (two) times daily.  60 tablet  1  . nicotine (NICODERM CQ - DOSED IN MG/24 HOURS) 21 mg/24hr  patch Place 1 patch onto the skin daily.  28 patch  1  . omeprazole (PRILOSEC) 20 MG capsule Take 1 capsule (20 mg total) by mouth daily.  30 capsule  1  . traMADol (ULTRAM) 50 MG tablet TAKE 1 TO 2 TABLETS BY MOUTH EVERY 6 HOURS AS NEEDED FOR PAIN  50 tablet  0    Physical Exam BP 113/72  Pulse 64  Resp 16  Ht 5' (1.524 m)  Wt 121 lb 8 oz (55.112 kg)  BMI 23.73 kg/m2  SpO2 48% Gen. 56 year old woman in no acute distress, she has lost weight Neuro alert and oriented x3, no focal deficits Lungs clear with equal breath sounds bilaterally Cardiac regular rate and rhythm, no murmur Incision 2 x 3 mm area of skin separation to suture granuloma, no sign of infection Sternum stable No peripheral edema  Diagnostic Tests: Chest x-ray 03/20/2012  IMPRESSION:  Status post median sternotomy and aortic valve replacement. No  radiographic evidence of acute cardiopulmonary process.   Impression: 56 year old woman status post aortic valve replacement for critical aortic stenosis. All things considered I think she is doing very well at this point in time. She  does have incisional discomfort and still requiring pain medication. Unsure what to make of her paranoia, this dates back to prior to her surgery. She was advised not to lift any objects greater than 10 pounds for at least another 3 weeks. She will apply peroxide to the open wound daily, and keep covered with a dry dressing.  Plan: I will see her back in 3 weeks to check on the status of her wound healing, but she was advised to call us immediately if there is any change for the worse with a wound.  She has an appointment with Dr. Mariah [name] next week.

## 2012-03-26 ENCOUNTER — Encounter: Payer: Medicaid Other | Admitting: Cardiovascular Disease

## 2012-04-01 ENCOUNTER — Emergency Department: Payer: Self-pay | Admitting: Emergency Medicine

## 2012-04-01 LAB — BASIC METABOLIC PANEL
Anion Gap: 6 — ABNORMAL LOW (ref 7–16)
BUN: 10 mg/dL (ref 7–18)
Calcium, Total: 8.5 mg/dL (ref 8.5–10.1)
Chloride: 109 mmol/L — ABNORMAL HIGH (ref 98–107)
Co2: 22 mmol/L (ref 21–32)
Creatinine: 1.1 mg/dL (ref 0.60–1.30)
EGFR (African American): >60
EGFR (Non-African Amer.): 56 — ABNORMAL LOW
Glucose: 99 mg/dL (ref 65–99)
Osmolality: 273 (ref 275–301)
Potassium: 3.4 mmol/L — ABNORMAL LOW (ref 3.5–5.1)
Sodium: 137 mmol/L (ref 136–145)

## 2012-04-01 LAB — CBC
HCT: 35 % (ref 35.0–47.0)
HGB: 11.2 g/dL — ABNORMAL LOW (ref 12.0–16.0)
MCH: 26.1 pg (ref 26.0–34.0)
MCHC: 31.9 g/dL — ABNORMAL LOW (ref 32.0–36.0)
MCV: 82 fL (ref 80–100)
Platelet: 335 10*3/uL (ref 150–440)
RBC: 4.28 10*6/uL (ref 3.80–5.20)
RDW: 17 % — ABNORMAL HIGH (ref 11.5–14.5)
WBC: 6 10*3/uL (ref 3.6–11.0)

## 2012-04-01 LAB — TSH: Thyroid Stimulating Horm: 0.357 u[IU]/mL — ABNORMAL LOW

## 2012-04-01 LAB — MAGNESIUM: Magnesium: 1.6 mg/dL — ABNORMAL LOW

## 2012-04-01 LAB — TROPONIN I: Troponin-I: 0.03 ng/mL

## 2012-04-03 ENCOUNTER — Other Ambulatory Visit: Payer: Self-pay | Admitting: Cardiothoracic Surgery

## 2012-04-09 ENCOUNTER — Encounter: Payer: Self-pay | Admitting: Cardiovascular Disease

## 2012-04-09 ENCOUNTER — Ambulatory Visit (INDEPENDENT_AMBULATORY_CARE_PROVIDER_SITE_OTHER): Payer: Medicaid Other | Admitting: Cardiovascular Disease

## 2012-04-09 VITALS — BP 110/60 | HR 88 | Ht 60.0 in | Wt 121.5 lb

## 2012-04-09 DIAGNOSIS — R42 Dizziness and giddiness: Secondary | ICD-10-CM

## 2012-04-09 DIAGNOSIS — I350 Nonrheumatic aortic (valve) stenosis: Secondary | ICD-10-CM

## 2012-04-09 DIAGNOSIS — I359 Nonrheumatic aortic valve disorder, unspecified: Secondary | ICD-10-CM

## 2012-04-09 DIAGNOSIS — I10 Essential (primary) hypertension: Secondary | ICD-10-CM

## 2012-04-09 DIAGNOSIS — I509 Heart failure, unspecified: Secondary | ICD-10-CM

## 2012-04-09 DIAGNOSIS — I2510 Atherosclerotic heart disease of native coronary artery without angina pectoris: Secondary | ICD-10-CM

## 2012-04-09 NOTE — Progress Notes (Signed)
Patient ID: [name], female    DOB: May 17, 1956, 56 y.o.   MRN: [id number]  HPI Comments: Ms. Gaspar is a 56 year old woman with a history of severe aortic valve stenosis, status post recent aortic valve replacement,  long smoking history, mild coronary artery disease by cardiac catheterization, chronic back pain who is on pain medications, who presents for routine followup after her valve surgery.  She reports that she is doing relatively well. She does have a headache which she has had for the past 2 weeks. She has had migraines in the past. "My primary care physician does not give me anything for these".  She does report a spinning and some unsteady gait from her spinning. She wonders if this could be secondary to the new medications that were started in the hospital for blood pressure.  She has seen Erline Hau in the past from ear nose throat. She has had a fluttering in her chest typically when she overexerts herself. No significant chest pain. No significant edema or shortness of breath. In fact she reports her shortness of breath has improved since the surgery  Most recent cardiac catheterization last month showed mild to moderate nonobstructive disease, distal aortic disease estimated at moderate.  EKG shows normal sinus rhythm with rate in the beats per minute with diffuse nonspecific ST abnormality in V4 through V6, 1, aVL, 2, 3, aVF   Outpatient Encounter Prescriptions as of 04/09/2012  Medication Sig Dispense Refill  . amLODipine (NORVASC) 10 MG tablet Take 1 tablet (10 mg total) by mouth daily.  30 tablet  1  . benazepril (LOTENSIN) 10 MG tablet Take 1 tablet (10 mg total) by mouth daily.  30 tablet  1  . benzonatate (TESSALON) 200 MG capsule TAKE ONE CAPSULE BY MOUTH 3 TIMES A DAY AS NEEDED FOR COUGH  20 capsule  0  . levothyroxine (SYNTHROID, LEVOTHROID) 50 MCG tablet Take 1 tablet (50 mcg total) by mouth daily.  30 tablet  1  . omeprazole (PRILOSEC) 20 MG capsule Take 1  capsule (20 mg total) by mouth daily.  30 capsule  1  . traMADol (ULTRAM) 50 MG tablet TAKE 1 TO 2 TABLETS BY MOUTH EVERY 6 HOURS AS NEEDED FOR PAIN  50 tablet  0  . metoprolol tartrate (LOPRESSOR) 25 MG tablet Take 1 tablet (25 mg total) by mouth 2 (two) times daily.  60 tablet  1  . DISCONTD: aspirin 325 MG tablet Take 325 mg by mouth daily.         Review of Systems  Constitutional: Negative.   HENT: Negative.   Eyes: Negative.   Cardiovascular: Positive for palpitations.  Gastrointestinal: Negative.   Musculoskeletal: Negative.   Skin: Negative.   Neurological: Negative.        "Spinning"  Hematological: Negative.   Psychiatric/Behavioral: Negative.   All other systems reviewed and are negative.    BP 110/60  Pulse 88  Ht 5' (1.524 m)  Wt 121 lb 8 oz (55.112 kg)  BMI 23.73 kg/m2  Physical Exam  Nursing note and vitals reviewed. Constitutional: She is oriented to person, place, and time. She appears well-developed and well-nourished.  HENT:  Head: Normocephalic.  Nose: Nose normal.  Mouth/Throat: Oropharynx is clear and moist.  Eyes: Conjunctivae are normal. Pupils are equal, round, and reactive to light.  Neck: Normal range of motion. Neck supple. No JVD present.  Cardiovascular: Normal rate, regular rhythm, S1 normal, S2 normal and intact distal pulses.  Exam reveals no  gallop and no friction rub.   Murmur heard.  Systolic murmur is present with a grade of 1/6  Pulmonary/Chest: Effort normal and breath sounds normal. No respiratory distress. She has no wheezes. She has no rales. She exhibits no tenderness.  Abdominal: Soft. Bowel sounds are normal. She exhibits no distension. There is no tenderness.  Musculoskeletal: Normal range of motion. She exhibits no edema and no tenderness.  Lymphadenopathy:    She has no cervical adenopathy.  Neurological: She is alert and oriented to person, place, and time. Coordination normal.  Skin: Skin is warm and dry. No rash noted.  No erythema.  Psychiatric: She has a normal mood and affect. Her behavior is normal. Judgment and thought content normal.         Assessment and Plan

## 2012-04-09 NOTE — Assessment & Plan Note (Signed)
Mild CAD. We have recommended smoking cessation. She continues to smoke one pack every 2 days.

## 2012-04-09 NOTE — Patient Instructions (Addendum)
For your dizziness, try to hold the amlodipine for one to two weeks If the dizziness does not go away call the office   Take an extra metoprolol pill for fluttering  Please call us if you have new issues that need to be addressed before your next appt.  Your physician wants you to follow-up in: 1 months.

## 2012-04-09 NOTE — Assessment & Plan Note (Signed)
Status post aVR. She appears to be doing well. No suggestion of fluid retention.

## 2012-04-09 NOTE — Assessment & Plan Note (Signed)
She attributes the dizziness to one of her medications. In an effort to provide symptom relief, we have suggested she hold her amlodipine for one to 2 weeks as a trial. If dizziness resolves, we can use an alternate blood pressure medication. The same could be done with her ACE inhibitor. We did suggest she schedule a followup appointment with ear nose throat. She would like to try medications first.

## 2012-04-10 ENCOUNTER — Encounter: Payer: Self-pay | Admitting: Thoracic Surgery (Cardiothoracic Vascular Surgery)

## 2012-04-10 ENCOUNTER — Ambulatory Visit (INDEPENDENT_AMBULATORY_CARE_PROVIDER_SITE_OTHER): Payer: Self-pay | Admitting: Thoracic Surgery (Cardiothoracic Vascular Surgery)

## 2012-04-10 VITALS — BP 134/79 | HR 93 | Resp 20 | Ht 60.0 in | Wt 121.0 lb

## 2012-04-10 DIAGNOSIS — I359 Nonrheumatic aortic valve disorder, unspecified: Secondary | ICD-10-CM

## 2012-04-10 DIAGNOSIS — Z952 Presence of prosthetic heart valve: Secondary | ICD-10-CM

## 2012-04-10 DIAGNOSIS — Z954 Presence of other heart-valve replacement: Secondary | ICD-10-CM

## 2012-04-10 MED ORDER — MECLIZINE HCL 25 MG PO CHEW: 25.0000 mg | CHEWABLE_TABLET | Freq: Three times a day (TID) | ORAL | Status: DC | PRN

## 2012-04-10 NOTE — Progress Notes (Signed)
HPI:  [name] returns for a scheduled followup visit. She had aortic valve replacement with a pericardial valve on May 20. Her postoperative course was complicated early on by delirium. This eventually resolved and she was discharged home without further issue. I saw in the office on 03/20/2012. At that time she was having a lot of paranoid ideation. She also had an area of the sternal wound is concerning is returns today for followup of that.  In the interim she says that she doesn't feel paranoid any more. However she does give a description of her daughter giving away her pain medication to her mother , so I am not sure that there is not still some paranoia there. She says that Tylenol and ibuprofen have been ineffective for her pain. Her biggest complaint currently is that she's been having dizziness. This often occurs when she is standing, however she does have episodes when sitting or lying down. She does not notice a consistent pattern occurring when she first stands or sits up. She was advised by Dr. Mariah Milling to hold her amlodipine for the next week to see if that made any difference.  Past Medical History  Diagnosis Date  . Smoking history   . Aortic valve stenosis   . Back pain, chronic   . Chest pain   . SOB (shortness of breath)   . Coronary artery disease     mild; non-hemodynamically significant  . CHF (congestive heart failure)   . Dyslipidemia   . Thyroid disease     hypothyroidism  . Barrett esophagus   . GERD (gastroesophageal reflux disease)     with PUD  . PUD (peptic ulcer disease)   . Depression   . Deaf   . Sinus congestion   . Wheezing   . Rash, skin     2 weeks ago, bilateral hand rash, peeling, eruptions   . Seizures     as a child at age 26  . Hypothyroidism   . Hypertension   . Arthritis     back, hips, legs  . PONV (postoperative nausea and vomiting)       Current Outpatient Prescriptions  Medication Sig Dispense Refill  . amLODipine  (NORVASC) 10 MG tablet Take 1 tablet (10 mg total) by mouth daily.  30 tablet  1  . benazepril (LOTENSIN) 10 MG tablet Take 1 tablet (10 mg total) by mouth daily.  30 tablet  1  . benzonatate (TESSALON) 200 MG capsule TAKE ONE CAPSULE BY MOUTH 3 TIMES A DAY AS NEEDED FOR COUGH  20 capsule  0  . levothyroxine (SYNTHROID, LEVOTHROID) 50 MCG tablet Take 1 tablet (50 mcg total) by mouth daily.  30 tablet  1  . metoprolol tartrate (LOPRESSOR) 25 MG tablet Take 1 tablet (25 mg total) by mouth 2 (two) times daily.  60 tablet  1  . omeprazole (PRILOSEC) 20 MG capsule Take 1 capsule (20 mg total) by mouth daily.  30 capsule  1  . traMADol (ULTRAM) 50 MG tablet TAKE 1 TO 2 TABLETS BY MOUTH EVERY 6 HOURS AS NEEDED FOR PAIN  50 tablet  0  . Meclizine HCl 25 MG CHEW Chew 1 tablet (25 mg total) by mouth 3 (three) times daily as needed (dizziness).  45 each  1    Physical Exam BP 134/79  Pulse 93  Resp 20  Ht 5' (1.524 m)  Wt 121 lb (54.885 kg)  BMI 23.63 kg/m2  SpO2 53% Gen. 56 year old woman in no  acute distress Neurologic no focal motor deficits Lungs clear with equal breath sounds bilaterally Cardiac regular rate and rhythm normal S1 and S2 no rubs or gallops Sternum stable, incision well-healed  Diagnostic Tests: None  Impression: 56 year old woman status post aortic valve replacement. Overall she's doing as well as you could expect at this point in time. She is very difficult to get a handle on in terms of taking a history. However as best I can tell it sounds like she may be having vertigo more so than orthostatic hypotension. I have been a prescription for her for meclizine 25 mg 3 times daily as needed to see if that will help. I agree with Dr. Mariah Milling that she does need to see Dr. Earlene Plater about this issue. Her wound is healing well. She is now 6 weeks out from surgery so there are no specific restrictions on her activities although I did advise her to build into them gradually.  Plan: I will  be happy to see Mrs. Imes back at any time in the future that can be of any further assistance with her care.

## 2012-05-10 ENCOUNTER — Ambulatory Visit (INDEPENDENT_AMBULATORY_CARE_PROVIDER_SITE_OTHER): Payer: Medicaid Other | Admitting: Cardiovascular Disease

## 2012-05-10 ENCOUNTER — Encounter: Payer: Self-pay | Admitting: Cardiovascular Disease

## 2012-05-10 VITALS — BP 125/64 | HR 83 | Ht 60.0 in | Wt 125.2 lb

## 2012-05-10 DIAGNOSIS — I359 Nonrheumatic aortic valve disorder, unspecified: Secondary | ICD-10-CM

## 2012-05-10 DIAGNOSIS — L299 Pruritus, unspecified: Secondary | ICD-10-CM | POA: Insufficient documentation

## 2012-05-10 DIAGNOSIS — I2510 Atherosclerotic heart disease of native coronary artery without angina pectoris: Secondary | ICD-10-CM

## 2012-05-10 DIAGNOSIS — R000 Tachycardia, unspecified: Secondary | ICD-10-CM

## 2012-05-10 DIAGNOSIS — R079 Chest pain, unspecified: Secondary | ICD-10-CM

## 2012-05-10 DIAGNOSIS — I350 Nonrheumatic aortic (valve) stenosis: Secondary | ICD-10-CM

## 2012-05-10 DIAGNOSIS — R42 Dizziness and giddiness: Secondary | ICD-10-CM

## 2012-05-10 MED ORDER — METOPROLOL TARTRATE 25 MG PO TABS: 25.0000 mg | ORAL_TABLET | Freq: Two times a day (BID) | ORAL | Status: DC

## 2012-05-10 NOTE — Assessment & Plan Note (Signed)
Doing well following her valve surgery. Improvement of her shortness of breath.

## 2012-05-10 NOTE — Patient Instructions (Addendum)
You are doing well. Hold the benazepril  to see if your rash gets better Restart metoprolol one in the AM and PM for heart rate (heart rate is fast today)  Please call us if you have new issues that need to be addressed before your next appt.  Your physician wants you to follow-up in: 6 months.  You will receive a reminder letter in the mail two months in advance. If you don't receive a letter, please call our office to schedule the follow-up appointment.

## 2012-05-10 NOTE — Progress Notes (Signed)
Patient ID: [name], female    DOB: [birth year], 56 y.o.   MRN: [id number]  HPI Comments: [name] is a 56 year old woman with a history of severe aortic valve stenosis, status post recent aortic valve replacement,  long smoking history, mild coronary artery disease by cardiac catheterization, chronic back pain who is on pain medications, who presents for routine followup after her valve surgery.  She reports that she is doing relatively well. She continues to have chest discomfort which she attributes to the surgery and she is taking tramadol with good pain control.  she has diffuse itching and wonders if this could be from the benazepril. She was previously on metoprolol but does not remember having this medication anymore. She continues to take care of her mother who has Alzheimer's. She continues to smoke.  On her last clinic visit, her amlodipine was held and she reports her dizziness has resolved.   Most recent cardiac catheterization last month showed mild to moderate nonobstructive disease, distal aortic disease estimated at moderate.  EKG shows normal sinus rhythm with rate of 83   beats per minute with diffuse nonspecific ST abnormality in V4 through V6, 1, aVL, 2, 3, aVF   Outpatient Encounter Prescriptions as of 05/10/2012  Medication Sig Dispense Refill  . benazepril (LOTENSIN) 10 MG tablet Take 1 tablet (10 mg total) by mouth daily.  30 tablet  1  . levothyroxine (SYNTHROID, LEVOTHROID) 50 MCG tablet Take 1 tablet (50 mcg total) by mouth daily.  30 tablet  1  . omeprazole (PRILOSEC) 20 MG capsule Take 1 capsule (20 mg total) by mouth daily.  30 capsule  1  . traMADol (ULTRAM) 50 MG tablet TAKE 1 TO 2 TABLETS BY MOUTH EVERY 6 HOURS AS NEEDED FOR PAIN  50 tablet  0  . amLODipine (NORVASC) 10 MG tablet Take 1 tablet (10 mg total) by mouth daily.  30 tablet  1  . DISCONTD: benzonatate (TESSALON) 200 MG capsule TAKE ONE CAPSULE BY MOUTH 3 TIMES A DAY AS NEEDED FOR COUGH  20  capsule  0  . DISCONTD: Meclizine HCl 25 MG CHEW Chew 1 tablet (25 mg total) by mouth 3 (three) times daily as needed (dizziness).  45 each  1  . DISCONTD: metoprolol tartrate (LOPRESSOR) 25 MG tablet Take 1 tablet (25 mg total) by mouth 2 (two) times daily.  60 tablet  1     Review of Systems  Constitutional: Negative.   HENT: Negative.   Eyes: Negative.   Respiratory: Negative.   Gastrointestinal: Negative.   Musculoskeletal: Negative.   Skin: Negative.        Itching  Neurological: Negative.   Hematological: Negative.   Psychiatric/Behavioral: Negative.   All other systems reviewed and are negative.    BP 108/64  Pulse 83  Ht 5' (1.524 m)  Wt 125 lb 4 oz (56.813 kg)  BMI 24.46 kg/m2  Physical Exam  Nursing note and vitals reviewed. Constitutional: She is oriented to person, place, and time. She appears well-developed and well-nourished.  HENT:  Head: Normocephalic.  Nose: Nose normal.  Mouth/Throat: Oropharynx is clear and moist.  Eyes: Conjunctivae are normal. Pupils are equal, round, and reactive to light.  Neck: Normal range of motion. Neck supple. No JVD present.  Cardiovascular: Normal rate, regular rhythm, S1 normal, S2 normal and intact distal pulses.  Exam reveals no gallop and no friction rub.   Murmur heard.  Systolic murmur is present with a grade of 1/6  Pulmonary/Chest: Effort normal and breath sounds normal. No respiratory distress. She has no wheezes. She has no rales. She exhibits no tenderness.  Abdominal: Soft. Bowel sounds are normal. She exhibits no distension. There is no tenderness.  Musculoskeletal: Normal range of motion. She exhibits no edema and no tenderness.  Lymphadenopathy:    She has no cervical adenopathy.  Neurological: She is alert and oriented to person, place, and time. Coordination normal.  Skin: Skin is warm and dry. No rash noted. No erythema.  Psychiatric: She has a normal mood and affect. Her behavior is normal. Judgment and  thought content normal.         Assessment and Plan

## 2012-05-10 NOTE — Assessment & Plan Note (Signed)
Uncertain if this is from her benazepril. We have suggested she hold the benazepril for now

## 2012-05-10 NOTE — Assessment & Plan Note (Signed)
Heart rate is mildly elevated. We will restart metoprolol 25 mg twice a day, hold the benazepril.

## 2012-05-10 NOTE — Assessment & Plan Note (Signed)
Dizziness resolved by holding amlodipine.

## 2012-05-10 NOTE — Assessment & Plan Note (Signed)
We have encouraged smoking cessation. Ideally she should be on a statin with LDL less than 70.

## 2012-05-29 ENCOUNTER — Other Ambulatory Visit: Payer: Self-pay | Admitting: Surgical

## 2013-01-01 ENCOUNTER — Ambulatory Visit (INDEPENDENT_AMBULATORY_CARE_PROVIDER_SITE_OTHER): Payer: Medicaid Other | Admitting: Cardiovascular Disease

## 2013-01-01 ENCOUNTER — Encounter: Payer: Self-pay | Admitting: Cardiovascular Disease

## 2013-01-01 VITALS — BP 192/100 | HR 94 | Ht 60.0 in | Wt 122.5 lb

## 2013-01-01 DIAGNOSIS — M549 Dorsalgia, unspecified: Secondary | ICD-10-CM

## 2013-01-01 DIAGNOSIS — I10 Essential (primary) hypertension: Secondary | ICD-10-CM

## 2013-01-01 DIAGNOSIS — R0602 Shortness of breath: Secondary | ICD-10-CM

## 2013-01-01 DIAGNOSIS — R079 Chest pain, unspecified: Secondary | ICD-10-CM

## 2013-01-01 DIAGNOSIS — I2510 Atherosclerotic heart disease of native coronary artery without angina pectoris: Secondary | ICD-10-CM

## 2013-01-01 DIAGNOSIS — I359 Nonrheumatic aortic valve disorder, unspecified: Secondary | ICD-10-CM

## 2013-01-01 DIAGNOSIS — I350 Nonrheumatic aortic (valve) stenosis: Secondary | ICD-10-CM

## 2013-01-01 MED ORDER — METOPROLOL TARTRATE 25 MG PO TABS: 25.0000 mg | ORAL_TABLET | Freq: Two times a day (BID) | ORAL | Status: AC

## 2013-01-01 MED ORDER — BENAZEPRIL HCL 40 MG PO TABS: 40.0000 mg | ORAL_TABLET | Freq: Every day | ORAL | Status: DC

## 2013-01-01 MED ORDER — AMLODIPINE BESYLATE 10 MG PO TABS: 10.0000 mg | ORAL_TABLET | Freq: Every day | ORAL | Status: AC

## 2013-01-01 NOTE — Patient Instructions (Addendum)
Your blood pressure is very high  Please start amlodipine one a day for blood pessure Please continue on metoprolol twice a day (Am and PM), help with heart speed Start benazepril 40 mg daily for blood pressure  Please call us if you have new issues that need to be addressed before your next appt.  Your physician wants you to follow-up in: 1 month.

## 2013-01-01 NOTE — Progress Notes (Signed)
Patient ID: [name], female    DOB: October 22, 1955, 57 y.o.   MRN: [id number]  HPI Comments: [name] is a 57 year old woman with a history of severe aortic valve stenosis, status post recent aortic valve replacement,  long smoking history who continues to smoke, mild coronary artery disease by cardiac catheterization, chronic back pain who is on pain medications, who presents for routine followup.  She reports having chronic back pain, chest pain. She is requesting narcotics. Chronic cough. States that she had a shot in her back in December 2013 in since then her blood pressure has been high. She has not felt well, reports having nausea, vomiting. She felt it was from her lisinopril 10 mg daily so she stopped the medication. She does not remember taking metoprolol as is on her last.  Mother recently placed in a nursing home  Most recent cardiac catheterization  showed mild to moderate nonobstructive disease, distal aortic disease estimated at moderate.  EKG shows normal sinus rhythm with rate of 94  beats per minute with diffuse nonspecific ST abnormality in V4 through V6, 1, aVL, 2, 3, aVF   Outpatient Encounter Prescriptions as of 01/01/2013  Medication Sig Dispense Refill  . benazepril (LOTENSIN) 40 MG tablet Take 1 tablet (40 mg total) by mouth daily.  30 tablet  6  . levothyroxine (SYNTHROID, LEVOTHROID) 50 MCG tablet Take 1 tablet (50 mcg total) by mouth daily.  30 tablet  1  . metoprolol tartrate (LOPRESSOR) 25 MG tablet Take 1 tablet (25 mg total) by mouth 2 (two) times daily.  60 tablet  6  . omeprazole (PRILOSEC) 20 MG capsule Take 1 capsule (20 mg total) by mouth daily.  30 capsule  1  . traMADol (ULTRAM) 50 MG tablet TAKE 1 TO 2 TABLETS BY MOUTH EVERY 6 HOURS AS NEEDED FOR PAIN  50 tablet  0  . [DISCONTINUED] benazepril (LOTENSIN) 10 MG tablet Take 1 tablet (10 mg total) by mouth daily.  30 tablet  1  . [DISCONTINUED] metoprolol tartrate (LOPRESSOR) 25 MG tablet Take 1 tablet  (25 mg total) by mouth 2 (two) times daily.  60 tablet  6  . amLODipine (NORVASC) 10 MG tablet Take 1 tablet (10 mg total) by mouth daily.  30 tablet  11  . gabapentin (NEURONTIN) 300 MG capsule Tales 1-3 tablets daily at bedtime.       No facility-administered encounter medications on file as of 01/01/2013.     Review of Systems  Constitutional: Negative.        Malaise  HENT: Negative.   Eyes: Negative.   Respiratory: Negative.   Cardiovascular: Negative.   Gastrointestinal: Positive for nausea.  Musculoskeletal: Positive for back pain.  Skin: Negative.   Neurological: Negative.   Psychiatric/Behavioral: Negative.   All other systems reviewed and are negative.    BP 192/100  Pulse 94  Ht 5' (1.524 m)  Wt 122 lb 8 oz (55.566 kg)  BMI 23.92 kg/m2 Repeat blood pressure consistent with initial blood pressure Physical Exam  Nursing note and vitals reviewed. Constitutional: She is oriented to person, place, and time. She appears well-developed and well-nourished.  HENT:  Head: Normocephalic.  Nose: Nose normal.  Mouth/Throat: Oropharynx is clear and moist.  Eyes: Conjunctivae are normal. Pupils are equal, round, and reactive to light.  Neck: Normal range of motion. Neck supple. No JVD present.  Cardiovascular: Normal rate, regular rhythm, S1 normal, S2 normal and intact distal pulses.  Exam reveals no gallop and  no friction rub.   Murmur heard.  Systolic murmur is present with a grade of 1/6  Pulmonary/Chest: Effort normal and breath sounds normal. No respiratory distress. She has no wheezes. She has no rales. She exhibits no tenderness.  Abdominal: Soft. Bowel sounds are normal. She exhibits no distension. There is no tenderness.  Musculoskeletal: Normal range of motion. She exhibits no edema and no tenderness.  Lymphadenopathy:    She has no cervical adenopathy.  Neurological: She is alert and oriented to person, place, and time. Coordination normal.  Skin: Skin is warm  and dry. No rash noted. No erythema.  Psychiatric: She has a normal mood and affect. Her behavior is normal. Judgment and thought content normal.    Assessment and Plan

## 2013-01-01 NOTE — Assessment & Plan Note (Signed)
Recovered well from her aortic valve replacement surgery.

## 2013-01-01 NOTE — Assessment & Plan Note (Signed)
Currently with no symptoms of angina. No further workup at this time. Continue current medication regimen. 

## 2013-01-01 NOTE — Assessment & Plan Note (Signed)
She has chronic pain issues, requesting pain medication. We have asked her to talk with her primary care physician

## 2013-01-01 NOTE — Assessment & Plan Note (Signed)
We will restart amlodipine 10 mg daily, increase her ACE inhibitor to 40 mg daily, restart metoprolol 25 mg twice a day. There seems to be some medication confusion him a possible compliance issues.

## 2013-02-01 ENCOUNTER — Ambulatory Visit (INDEPENDENT_AMBULATORY_CARE_PROVIDER_SITE_OTHER): Payer: Medicaid Other | Admitting: Cardiovascular Disease

## 2013-02-01 ENCOUNTER — Encounter: Payer: Self-pay | Admitting: Cardiovascular Disease

## 2013-02-01 VITALS — BP 128/82 | HR 82 | Ht 60.0 in | Wt 121.5 lb

## 2013-02-01 DIAGNOSIS — R079 Chest pain, unspecified: Secondary | ICD-10-CM

## 2013-02-01 DIAGNOSIS — Z954 Presence of other heart-valve replacement: Secondary | ICD-10-CM

## 2013-02-01 DIAGNOSIS — R071 Chest pain on breathing: Secondary | ICD-10-CM

## 2013-02-01 DIAGNOSIS — Z952 Presence of prosthetic heart valve: Secondary | ICD-10-CM | POA: Insufficient documentation

## 2013-02-01 DIAGNOSIS — R0602 Shortness of breath: Secondary | ICD-10-CM

## 2013-02-01 DIAGNOSIS — I10 Essential (primary) hypertension: Secondary | ICD-10-CM

## 2013-02-01 DIAGNOSIS — G8929 Other chronic pain: Secondary | ICD-10-CM

## 2013-02-01 NOTE — Assessment & Plan Note (Signed)
She does report some shortness of breath. One year since her aortic valve replacement. Echocardiogram has been ordered

## 2013-02-01 NOTE — Progress Notes (Signed)
Patient ID: [name], female    DOB: [birth year], 57 y.o.   MRN: [id number]  HPI Comments: [name] is a 57 year old woman with a history of severe aortic valve stenosis, status post recent aortic valve replacement May 2013,  long smoking history who continues to smoke, mild coronary artery disease by cardiac catheterization, chronic back pain who is on pain medications, who presents for routine followup.  She presents today and reports doing relatively well. Continues to smoke, having bladder problems. One year since her aortic valve replacement. Mild chronic chest pain from the surgical site of her mediastinum. Less stress now that her mother is in a nursing home with advanced dementia She reports having chronic back pain, chest pain.  Some shortness of breath, mild to moderate. Chronic issue.  Most recent cardiac catheterization  showed mild to moderate nonobstructive disease, distal aortic disease estimated at moderate.  EKG shows normal sinus rhythm with rate of 82  beats per minute with diffuse nonspecific ST abnormality in V4 through V6, 1, aVL, 2, 3, aVF   Outpatient Encounter Prescriptions as of 02/01/2013  Medication Sig Dispense Refill  . amLODipine (NORVASC) 10 MG tablet Take 1 tablet (10 mg total) by mouth daily.  30 tablet  11  . benazepril (LOTENSIN) 40 MG tablet Take 1 tablet (40 mg total) by mouth daily.  30 tablet  6  . gabapentin (NEURONTIN) 300 MG capsule Tales 1-3 tablets daily at bedtime.      Marland Kitchen levothyroxine (SYNTHROID, LEVOTHROID) 50 MCG tablet Take 1 tablet (50 mcg total) by mouth daily.  30 tablet  1  . metoprolol tartrate (LOPRESSOR) 25 MG tablet Take 1 tablet (25 mg total) by mouth 2 (two) times daily.  60 tablet  6  . omeprazole (PRILOSEC) 20 MG capsule Take 1 capsule (20 mg total) by mouth daily.  30 capsule  1  . traMADol (ULTRAM) 50 MG tablet TAKE 1 TO 2 TABLETS BY MOUTH EVERY 6 HOURS AS NEEDED FOR PAIN  50 tablet  0   No facility-administered  encounter medications on file as of 02/01/2013.     Review of Systems  Constitutional: Negative.        Malaise  HENT: Negative.   Eyes: Negative.   Respiratory: Negative.   Cardiovascular: Positive for chest pain.       From surgical site  Gastrointestinal: Positive for nausea.  Musculoskeletal: Positive for back pain.  Skin: Negative.   Neurological: Negative.   Psychiatric/Behavioral: Negative.   All other systems reviewed and are negative.    BP 128/82  Pulse 82  Ht 5' (1.524 m)  Wt 121 lb 8 oz (55.112 kg)  BMI 23.73 kg/m2  Physical Exam  Nursing note and vitals reviewed. Constitutional: She is oriented to person, place, and time. She appears well-developed and well-nourished.  HENT:  Head: Normocephalic.  Nose: Nose normal.  Mouth/Throat: Oropharynx is clear and moist.  Eyes: Conjunctivae are normal. Pupils are equal, round, and reactive to light.  Neck: Normal range of motion. Neck supple. No JVD present.  Cardiovascular: Normal rate, regular rhythm, S1 normal, S2 normal and intact distal pulses.  Exam reveals no gallop and no friction rub.   Murmur heard.  Systolic murmur is present with a grade of 1/6  Pulmonary/Chest: Effort normal and breath sounds normal. No respiratory distress. She has no wheezes. She has no rales. She exhibits no tenderness.  Abdominal: Soft. Bowel sounds are normal. She exhibits no distension. There is no tenderness.  Musculoskeletal: Normal range of motion. She exhibits no edema and no tenderness.  Lymphadenopathy:    She has no cervical adenopathy.  Neurological: She is alert and oriented to person, place, and time. Coordination normal.  Skin: Skin is warm and dry. No rash noted. No erythema.  Psychiatric: She has a normal mood and affect. Her behavior is normal. Judgment and thought content normal.    Assessment and Plan

## 2013-02-01 NOTE — Patient Instructions (Addendum)
You are doing well. No medication changes were made.  We will order an echocardiogram to look at the aortic valve replacement, shortness of breath  Please call us if you have new issues that need to be addressed before your next appt.  Your physician wants you to follow-up in: 12 months.  You will receive a reminder letter in the mail two months in advance. If you don't receive a letter, please call our office to schedule the follow-up appointment.

## 2013-02-01 NOTE — Assessment & Plan Note (Signed)
Blood pressure is well controlled on today's visit. No changes made to the medications. 

## 2013-02-01 NOTE — Assessment & Plan Note (Signed)
Chest pain from her mediastinal incision from aVR.

## 2013-02-14 ENCOUNTER — Other Ambulatory Visit: Payer: Medicaid Other

## 2013-03-05 ENCOUNTER — Other Ambulatory Visit: Payer: Self-pay

## 2013-03-05 ENCOUNTER — Other Ambulatory Visit (INDEPENDENT_AMBULATORY_CARE_PROVIDER_SITE_OTHER): Payer: Medicaid Other

## 2013-03-05 DIAGNOSIS — Z954 Presence of other heart-valve replacement: Secondary | ICD-10-CM

## 2013-03-05 DIAGNOSIS — R0602 Shortness of breath: Secondary | ICD-10-CM

## 2013-03-05 DIAGNOSIS — Z952 Presence of prosthetic heart valve: Secondary | ICD-10-CM

## 2013-07-05 IMAGING — CR DG CHEST 1V PORT
1 series · 1 of 1 positions shown · non-contrast
Comparison: 02/16/2012.

CLINICAL DATA: Status post heart surgery.

PORTABLE CHEST - 1 VIEW

[view not recorded]
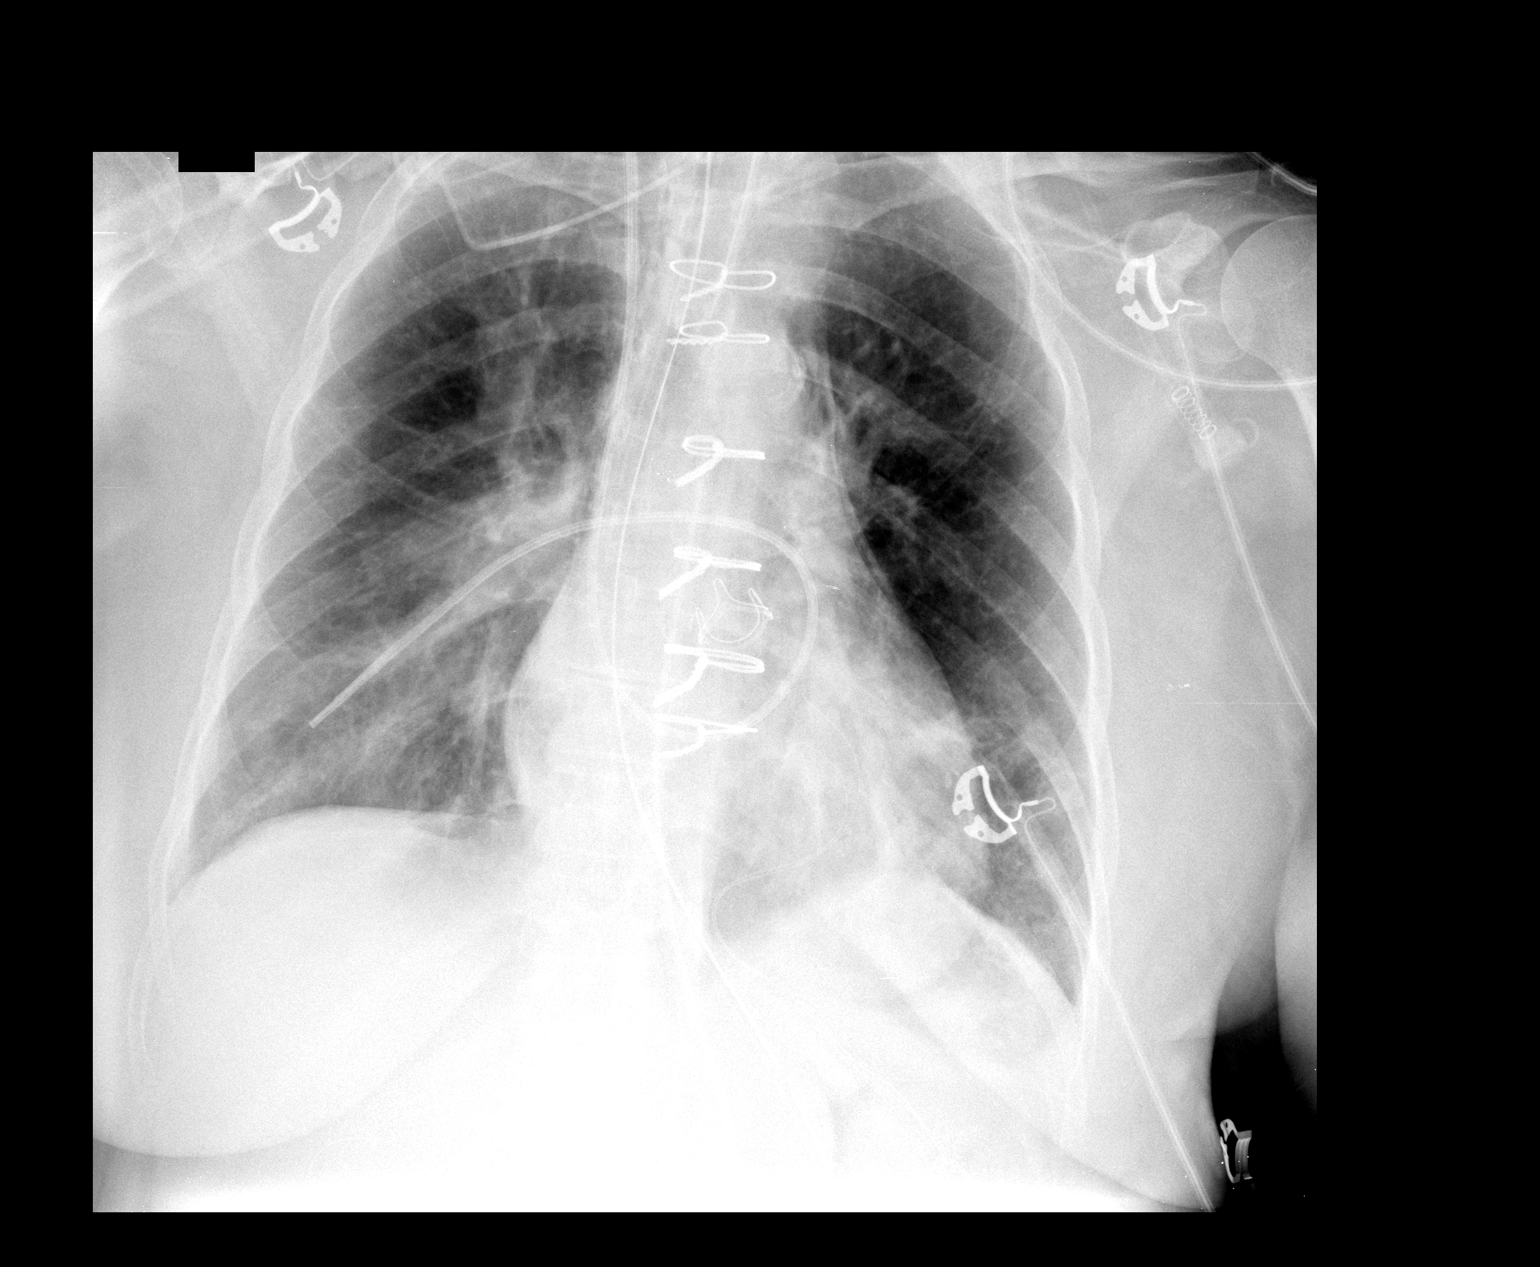

[1 of 1 positions shown; findings below may reference images not displayed]

FINDINGS: The cardiac silhouette is borderline enlarged.  Interval
median sternotomy wires and prosthetic heart valve.  Epicardial
pacer leads are in place.  Nasogastric tube extending into the
stomach with its side hole in the proximal stomach.  Endotracheal
tube tip 3 cm above the carina.  Right jugular Swan-Ganz catheter
tip in the lateral aspect of the right lower lung zone.  Interval
mild bibasilar atelectasis and small pneumomediastinum.  No
pneumothorax is seen.
IMPRESSION: 1.  Small postoperative pneumomediastinum without pneumothorax.
2.  Mild bibasilar atelectasis.

## 2014-05-09 ENCOUNTER — Ambulatory Visit (INDEPENDENT_AMBULATORY_CARE_PROVIDER_SITE_OTHER): Payer: Medicaid Other | Admitting: Cardiovascular Disease

## 2014-05-09 ENCOUNTER — Encounter: Payer: Self-pay | Admitting: Cardiovascular Disease

## 2014-05-09 VITALS — BP 120/76 | HR 73 | Ht 60.0 in | Wt 140.0 lb

## 2014-05-09 DIAGNOSIS — R0602 Shortness of breath: Secondary | ICD-10-CM

## 2014-05-09 DIAGNOSIS — I2510 Atherosclerotic heart disease of native coronary artery without angina pectoris: Secondary | ICD-10-CM

## 2014-05-09 DIAGNOSIS — F17200 Nicotine dependence, unspecified, uncomplicated: Secondary | ICD-10-CM | POA: Insufficient documentation

## 2014-05-09 DIAGNOSIS — R0789 Other chest pain: Secondary | ICD-10-CM

## 2014-05-09 DIAGNOSIS — I10 Essential (primary) hypertension: Secondary | ICD-10-CM

## 2014-05-09 DIAGNOSIS — I498 Other specified cardiac arrhythmias: Secondary | ICD-10-CM

## 2014-05-09 DIAGNOSIS — G8929 Other chronic pain: Secondary | ICD-10-CM

## 2014-05-09 DIAGNOSIS — I4902 Ventricular flutter: Secondary | ICD-10-CM

## 2014-05-09 DIAGNOSIS — R071 Chest pain on breathing: Secondary | ICD-10-CM

## 2014-05-09 DIAGNOSIS — Z954 Presence of other heart-valve replacement: Secondary | ICD-10-CM

## 2014-05-09 DIAGNOSIS — Z952 Presence of prosthetic heart valve: Secondary | ICD-10-CM

## 2014-05-09 NOTE — Progress Notes (Addendum)
Patient ID: [name], female    DOB: Jun 10, 1956, 58 y.o.   MRN: [id number]  HPI Comments: [name] is a 58 -year-old woman with a history of severe aortic valve stenosis, status post bioprosthetic aortic valve replacement May 2013,  long smoking history who continues to smoke, mild coronary artery disease by cardiac catheterization, chronic back pain who is on pain medications, who presents for routine followup.  She presents today and reports doing relatively well. Continues to smoke one half pack per day Two  year since her aortic valve replacement.  continues to Mild chronic chest pain from the surgical site of her mediastinum.  Less stress now that her mother is in a nursing home with advanced dementia Still with chronic back pain, chest pain.  Some shortness of breath, mild to moderate. Chronic issue.  Most recent cardiac catheterization  showed mild to moderate nonobstructive disease, distal aortic disease estimated at moderate.  EKG shows normal sinus rhythm with rate of 73  beats per minute with diffuse nonspecific ST abnormality in V4 through V6, 1, aVL, 2, 3, aVF   Outpatient Encounter Prescriptions as of 05/09/2014  Medication Sig  . amLODipine (NORVASC) 10 MG tablet Take 1 tablet (10 mg total) by mouth daily.  . benazepril (LOTENSIN) 40 MG tablet Take 1 tablet (40 mg total) by mouth daily.  Marland Kitchen. gabapentin (NEURONTIN) 300 MG capsule Tales 1-3 tablets daily at bedtime.  Marland Kitchen. levothyroxine (SYNTHROID, LEVOTHROID) 50 MCG tablet Take 1 tablet (50 mcg total) by mouth daily.  Marland Kitchen. omeprazole (PRILOSEC) 20 MG capsule Take 1 capsule (20 mg total) by mouth daily.  . traMADol (ULTRAM) 50 MG tablet TAKE 1 TO 2 TABLETS BY MOUTH EVERY 6 HOURS AS NEEDED FOR PAIN    Review of Systems  Constitutional: Negative.        Malaise  HENT: Negative.   Eyes: Negative.   Respiratory: Negative.   Cardiovascular: Positive for chest pain.       From surgical site  Gastrointestinal: Positive for  nausea.  Endocrine: Negative.   Musculoskeletal: Positive for back pain.  Skin: Negative.   Allergic/Immunologic: Negative.   Neurological: Negative.   Hematological: Negative.   Psychiatric/Behavioral: Negative.   All other systems reviewed and are negative.   BP 120/76  Pulse 73  Ht 5' (1.524 m)  Wt 140 lb (63.504 kg)  BMI 27.34 kg/m2  Physical Exam  Nursing note and vitals reviewed. Constitutional: She is oriented to person, place, and time. She appears well-developed and well-nourished.  HENT:  Head: Normocephalic.  Nose: Nose normal.  Mouth/Throat: Oropharynx is clear and moist.  Eyes: Conjunctivae are normal. Pupils are equal, round, and reactive to light.  Neck: Normal range of motion. Neck supple. No JVD present.  Cardiovascular: Normal rate, regular rhythm, S1 normal, S2 normal and intact distal pulses.  Exam reveals no gallop and no friction rub.   Murmur heard.  Systolic murmur is present with a grade of 1/6  Pulmonary/Chest: Effort normal and breath sounds normal. No respiratory distress. She has no wheezes. She has no rales. She exhibits no tenderness.  Abdominal: Soft. Bowel sounds are normal. She exhibits no distension. There is no tenderness.  Musculoskeletal: Normal range of motion. She exhibits no edema and no tenderness.  Lymphadenopathy:    She has no cervical adenopathy.  Neurological: She is alert and oriented to person, place, and time. Coordination normal.  Skin: Skin is warm and dry. No rash noted. No erythema.  Psychiatric: She has  a normal mood and affect. Her behavior is normal. Judgment and thought content normal.    Assessment and Plan

## 2014-05-09 NOTE — Patient Instructions (Signed)
You are doing well. No medication changes were made.  Please call us if you have new issues that need to be addressed before your next appt.  Your physician wants you to follow-up in: 12 months.  You will receive a reminder letter in the mail two months in advance. If you don't receive a letter, please call our office to schedule the follow-up appointment. 

## 2014-05-09 NOTE — Assessment & Plan Note (Signed)
Currently with no symptoms of angina. No further workup at this time. Continue current medication regimen. 

## 2014-05-09 NOTE — Assessment & Plan Note (Signed)
Blood pressure is well controlled on today's visit. No changes made to the medications. 

## 2014-05-09 NOTE — Assessment & Plan Note (Signed)
She continues to have discomfort in her upper mediastinal area from the surgical site. She is concerned about how the scar healed. No further workup needed

## 2014-05-09 NOTE — Assessment & Plan Note (Signed)
Chronic mild shortness of breath, likely from emphysema. Recommended smoking cessation

## 2014-05-09 NOTE — Assessment & Plan Note (Signed)
We have encouraged her to continue to work on weaning her cigarettes and smoking cessation. She will continue to work on this and does not want any assistance with chantix.  

## 2014-05-09 NOTE — Assessment & Plan Note (Signed)
Bioprosthetic valve, last echocardiogram in 2014. We'll repeat echocardiogram in 2016. No new clinical concerns for valve pathology

## 2014-06-04 ENCOUNTER — Ambulatory Visit: Payer: Self-pay | Admitting: Ophthalmology

## 2014-06-11 ENCOUNTER — Ambulatory Visit: Payer: Self-pay | Admitting: Ophthalmology

## 2014-06-11 LAB — CK TOTAL AND CKMB (NOT AT ARMC)
CK, Total: 152 U/L
CK-MB: 4 ng/mL — ABNORMAL HIGH (ref 0.5–3.6)

## 2014-06-11 LAB — TROPONIN I
TROPONIN-I: 0.02 ng/mL
Troponin-I: 0.03 ng/mL

## 2014-06-12 ENCOUNTER — Emergency Department: Payer: Self-pay | Admitting: Emergency Medicine

## 2014-06-12 LAB — COMPREHENSIVE METABOLIC PANEL
ANION GAP: 6 — AB (ref 7–16)
Albumin: 3 g/dL — ABNORMAL LOW (ref 3.4–5.0)
Alkaline Phosphatase: 98 U/L
BUN: 23 mg/dL — ABNORMAL HIGH (ref 7–18)
Bilirubin,Total: 0.1 mg/dL — ABNORMAL LOW (ref 0.2–1.0)
CHLORIDE: 107 mmol/L (ref 98–107)
Calcium, Total: 8 mg/dL — ABNORMAL LOW (ref 8.5–10.1)
Co2: 21 mmol/L (ref 21–32)
Creatinine: 1.61 mg/dL — ABNORMAL HIGH (ref 0.60–1.30)
GFR CALC AF AMER: 40 — AB
GFR CALC NON AF AMER: 35 — AB
Glucose: 85 mg/dL (ref 65–99)
Osmolality: 271 (ref 275–301)
POTASSIUM: 4.7 mmol/L (ref 3.5–5.1)
SGOT(AST): 30 U/L (ref 15–37)
SGPT (ALT): 15 U/L
Sodium: 134 mmol/L — ABNORMAL LOW (ref 136–145)
TOTAL PROTEIN: 6.8 g/dL (ref 6.4–8.2)

## 2014-06-12 LAB — URINALYSIS, COMPLETE
BILIRUBIN, UR: NEGATIVE
BLOOD: NEGATIVE
Bacteria: NONE SEEN
Glucose,UR: NEGATIVE mg/dL (ref 0–75)
Hyaline Cast: 13
Ketone: NEGATIVE
NITRITE: NEGATIVE
PH: 6 (ref 4.5–8.0)
PROTEIN: NEGATIVE
Specific Gravity: 1.014 (ref 1.003–1.030)
Squamous Epithelial: 2
WBC UR: 3 /HPF (ref 0–5)

## 2014-06-12 LAB — CBC
HCT: 38.7 % (ref 35.0–47.0)
HGB: 12.7 g/dL (ref 12.0–16.0)
MCH: 29.5 pg (ref 26.0–34.0)
MCHC: 33 g/dL (ref 32.0–36.0)
MCV: 89 fL (ref 80–100)
PLATELETS: 286 10*3/uL (ref 150–440)
RBC: 4.32 10*6/uL (ref 3.80–5.20)
RDW: 15.4 % — ABNORMAL HIGH (ref 11.5–14.5)
WBC: 8.2 10*3/uL (ref 3.6–11.0)

## 2014-06-12 LAB — TROPONIN I: Troponin-I: <0.02 ng/mL

## 2014-07-17 ENCOUNTER — Ambulatory Visit: Payer: Self-pay | Admitting: Ophthalmology

## 2014-07-18 ENCOUNTER — Other Ambulatory Visit (HOSPITAL_COMMUNITY): Payer: Self-pay | Admitting: *Deleted

## 2014-07-18 DIAGNOSIS — R9431 Abnormal electrocardiogram [ECG] [EKG]: Secondary | ICD-10-CM

## 2014-07-22 ENCOUNTER — Other Ambulatory Visit: Payer: Medicaid Other

## 2014-07-24 ENCOUNTER — Other Ambulatory Visit: Payer: Medicaid Other

## 2014-07-31 ENCOUNTER — Other Ambulatory Visit: Payer: Medicaid Other

## 2014-12-27 ENCOUNTER — Encounter (HOSPITAL_COMMUNITY): Payer: Self-pay | Admitting: *Deleted

## 2014-12-27 ENCOUNTER — Other Ambulatory Visit (HOSPITAL_COMMUNITY): Payer: Self-pay

## 2014-12-27 ENCOUNTER — Emergency Department (HOSPITAL_COMMUNITY): Payer: Medicaid Other

## 2014-12-27 ENCOUNTER — Inpatient Hospital Stay (HOSPITAL_COMMUNITY)
Admission: EM | Admit: 2014-12-27 | Discharge: 2015-01-04 | DRG: 917 | Disposition: A | Discharge status: Home / Self Care | Payer: Medicaid Other | Attending: Internal Medicine | Admitting: Internal Medicine

## 2014-12-27 DIAGNOSIS — F17210 Nicotine dependence, cigarettes, uncomplicated: Secondary | ICD-10-CM | POA: Diagnosis present

## 2014-12-27 DIAGNOSIS — F11921 Opioid use, unspecified with intoxication delirium: Secondary | ICD-10-CM | POA: Diagnosis not present

## 2014-12-27 DIAGNOSIS — Z9049 Acquired absence of other specified parts of digestive tract: Secondary | ICD-10-CM | POA: Diagnosis present

## 2014-12-27 DIAGNOSIS — T40601A Poisoning by unspecified narcotics, accidental (unintentional), initial encounter: Secondary | ICD-10-CM | POA: Diagnosis present

## 2014-12-27 DIAGNOSIS — T50904D Poisoning by unspecified drugs, medicaments and biological substances, undetermined, subsequent encounter: Secondary | ICD-10-CM | POA: Diagnosis not present

## 2014-12-27 DIAGNOSIS — G8929 Other chronic pain: Secondary | ICD-10-CM | POA: Diagnosis present

## 2014-12-27 DIAGNOSIS — G894 Chronic pain syndrome: Secondary | ICD-10-CM | POA: Diagnosis present

## 2014-12-27 DIAGNOSIS — F11121 Opioid abuse with intoxication delirium: Secondary | ICD-10-CM | POA: Diagnosis not present

## 2014-12-27 DIAGNOSIS — H9190 Unspecified hearing loss, unspecified ear: Secondary | ICD-10-CM | POA: Diagnosis present

## 2014-12-27 DIAGNOSIS — I2510 Atherosclerotic heart disease of native coronary artery without angina pectoris: Secondary | ICD-10-CM | POA: Diagnosis present

## 2014-12-27 DIAGNOSIS — G92 Toxic encephalopathy: Secondary | ICD-10-CM | POA: Diagnosis present

## 2014-12-27 DIAGNOSIS — R4182 Altered mental status, unspecified: Secondary | ICD-10-CM | POA: Diagnosis present

## 2014-12-27 DIAGNOSIS — I10 Essential (primary) hypertension: Secondary | ICD-10-CM | POA: Diagnosis present

## 2014-12-27 DIAGNOSIS — T426X1A Poisoning by other antiepileptic and sedative-hypnotic drugs, accidental (unintentional), initial encounter: Secondary | ICD-10-CM | POA: Diagnosis present

## 2014-12-27 DIAGNOSIS — F11221 Opioid dependence with intoxication delirium: Secondary | ICD-10-CM | POA: Diagnosis present

## 2014-12-27 DIAGNOSIS — H510 Palsy (spasm) of conjugate gaze: Secondary | ICD-10-CM

## 2014-12-27 DIAGNOSIS — K219 Gastro-esophageal reflux disease without esophagitis: Secondary | ICD-10-CM | POA: Diagnosis present

## 2014-12-27 DIAGNOSIS — K2270 Barrett's esophagus without dysplasia: Secondary | ICD-10-CM | POA: Diagnosis present

## 2014-12-27 DIAGNOSIS — M549 Dorsalgia, unspecified: Secondary | ICD-10-CM | POA: Diagnosis present

## 2014-12-27 DIAGNOSIS — E785 Hyperlipidemia, unspecified: Secondary | ICD-10-CM | POA: Diagnosis present

## 2014-12-27 DIAGNOSIS — F329 Major depressive disorder, single episode, unspecified: Secondary | ICD-10-CM | POA: Diagnosis present

## 2014-12-27 DIAGNOSIS — Z90710 Acquired absence of both cervix and uterus: Secondary | ICD-10-CM

## 2014-12-27 DIAGNOSIS — H518 Other specified disorders of binocular movement: Secondary | ICD-10-CM

## 2014-12-27 DIAGNOSIS — Z8711 Personal history of peptic ulcer disease: Secondary | ICD-10-CM | POA: Diagnosis not present

## 2014-12-27 DIAGNOSIS — R410 Disorientation, unspecified: Secondary | ICD-10-CM | POA: Diagnosis not present

## 2014-12-27 DIAGNOSIS — E0590 Thyrotoxicosis, unspecified without thyrotoxic crisis or storm: Secondary | ICD-10-CM | POA: Diagnosis present

## 2014-12-27 DIAGNOSIS — Z888 Allergy status to other drugs, medicaments and biological substances status: Secondary | ICD-10-CM | POA: Diagnosis not present

## 2014-12-27 DIAGNOSIS — Z9842 Cataract extraction status, left eye: Secondary | ICD-10-CM | POA: Diagnosis not present

## 2014-12-27 DIAGNOSIS — I161 Hypertensive emergency: Secondary | ICD-10-CM | POA: Diagnosis present

## 2014-12-27 DIAGNOSIS — F1123 Opioid dependence with withdrawal: Secondary | ICD-10-CM | POA: Diagnosis present

## 2014-12-27 DIAGNOSIS — I509 Heart failure, unspecified: Secondary | ICD-10-CM | POA: Diagnosis present

## 2014-12-27 DIAGNOSIS — I674 Hypertensive encephalopathy: Secondary | ICD-10-CM | POA: Diagnosis present

## 2014-12-27 DIAGNOSIS — Z9841 Cataract extraction status, right eye: Secondary | ICD-10-CM | POA: Diagnosis not present

## 2014-12-27 DIAGNOSIS — E039 Hypothyroidism, unspecified: Secondary | ICD-10-CM | POA: Diagnosis present

## 2014-12-27 DIAGNOSIS — M1990 Unspecified osteoarthritis, unspecified site: Secondary | ICD-10-CM | POA: Diagnosis present

## 2014-12-27 DIAGNOSIS — M5441 Lumbago with sciatica, right side: Secondary | ICD-10-CM | POA: Diagnosis not present

## 2014-12-27 DIAGNOSIS — Y929 Unspecified place or not applicable: Secondary | ICD-10-CM

## 2014-12-27 DIAGNOSIS — Z954 Presence of other heart-valve replacement: Secondary | ICD-10-CM | POA: Diagnosis not present

## 2014-12-27 DIAGNOSIS — Z952 Presence of prosthetic heart valve: Secondary | ICD-10-CM | POA: Diagnosis not present

## 2014-12-27 DIAGNOSIS — I160 Hypertensive urgency: Secondary | ICD-10-CM | POA: Insufficient documentation

## 2014-12-27 DIAGNOSIS — G9340 Encephalopathy, unspecified: Secondary | ICD-10-CM | POA: Diagnosis present

## 2014-12-27 LAB — COMPREHENSIVE METABOLIC PANEL
ALBUMIN: 3.9 g/dL (ref 3.5–5.2)
ALT: 13 U/L (ref 0–35)
AST: 33 U/L (ref 0–37)
Alkaline Phosphatase: 136 U/L — ABNORMAL HIGH (ref 39–117)
Anion gap: 12 (ref 5–15)
BUN: 12 mg/dL (ref 6–23)
CO2: 19 mmol/L (ref 19–32)
Calcium: 9.1 mg/dL (ref 8.4–10.5)
Chloride: 102 mmol/L (ref 96–112)
Creatinine, Ser: 0.94 mg/dL (ref 0.50–1.10)
GFR calc Af Amer: 75 mL/min — ABNORMAL LOW (ref >90)
GFR, EST NON AFRICAN AMERICAN: 65 mL/min — AB (ref >90)
Glucose, Bld: 87 mg/dL (ref 70–99)
Potassium: 4.1 mmol/L (ref 3.5–5.1)
SODIUM: 133 mmol/L — AB (ref 135–145)
Total Bilirubin: 0.9 mg/dL (ref 0.3–1.2)
Total Protein: 7.5 g/dL (ref 6.0–8.3)

## 2014-12-27 LAB — RAPID URINE DRUG SCREEN, HOSP PERFORMED
Amphetamines: NOT DETECTED
Barbiturates: NOT DETECTED
Benzodiazepines: NOT DETECTED
COCAINE: NOT DETECTED
OPIATES: POSITIVE — AB
TETRAHYDROCANNABINOL: NOT DETECTED

## 2014-12-27 LAB — TSH: TSH: 1.487 u[IU]/mL (ref 0.350–4.500)

## 2014-12-27 LAB — URINALYSIS, ROUTINE W REFLEX MICROSCOPIC
GLUCOSE, UA: NEGATIVE mg/dL
KETONES UR: 15 mg/dL — AB
LEUKOCYTES UA: NEGATIVE
Nitrite: NEGATIVE
PROTEIN: 100 mg/dL — AB
Specific Gravity, Urine: 1.019 (ref 1.005–1.030)
UROBILINOGEN UA: 1 mg/dL (ref 0.0–1.0)
pH: 6 (ref 5.0–8.0)

## 2014-12-27 LAB — ETHANOL: Alcohol, Ethyl (B): <5 mg/dL (ref 0–9)

## 2014-12-27 LAB — CBC
HCT: 43.6 % (ref 36.0–46.0)
HEMOGLOBIN: 14.7 g/dL (ref 12.0–15.0)
MCH: 29.5 pg (ref 26.0–34.0)
MCHC: 33.7 g/dL (ref 30.0–36.0)
MCV: 87.4 fL (ref 78.0–100.0)
PLATELETS: 332 10*3/uL (ref 150–400)
RBC: 4.99 MIL/uL (ref 3.87–5.11)
RDW: 15.2 % (ref 11.5–15.5)
WBC: 8.4 10*3/uL (ref 4.0–10.5)

## 2014-12-27 LAB — CK TOTAL AND CKMB (NOT AT ARMC)
CK, MB: 4.1 ng/mL — ABNORMAL HIGH (ref 0.3–4.0)
RELATIVE INDEX: INVALID (ref 0.0–2.5)
Total CK: 87 U/L (ref 7–177)

## 2014-12-27 LAB — I-STAT TROPONIN, ED: Troponin i, poc: 0.03 ng/mL (ref 0.00–0.08)

## 2014-12-27 LAB — SALICYLATE LEVEL: Salicylate Lvl: <4 mg/dL (ref 2.8–20.0)

## 2014-12-27 LAB — URINE MICROSCOPIC-ADD ON

## 2014-12-27 LAB — CLOSTRIDIUM DIFFICILE BY PCR: CDIFFPCR: NEGATIVE

## 2014-12-27 LAB — ACETAMINOPHEN LEVEL: Acetaminophen (Tylenol), Serum: <10 ug/mL — ABNORMAL LOW (ref 10–30)

## 2014-12-27 LAB — CBG MONITORING, ED: GLUCOSE-CAPILLARY: 88 mg/dL (ref 70–99)

## 2014-12-27 LAB — T4, FREE: Free T4: 1.9 ng/dL — ABNORMAL HIGH (ref 0.80–1.80)

## 2014-12-27 MED ORDER — SODIUM CHLORIDE 0.9 % IV BOLUS (SEPSIS)
1000.0000 mL | Freq: Once | INTRAVENOUS | Status: AC
  Administered 2014-12-27: 1000 mL via INTRAVENOUS

## 2014-12-27 MED ORDER — LABETALOL HCL 5 MG/ML IV SOLN
10.0000 mg | Freq: Once | INTRAVENOUS | Status: AC
  Administered 2014-12-27: 10 mg via INTRAVENOUS
  Filled 2014-12-27: qty 4

## 2014-12-27 MED ORDER — LACTATED RINGERS IV SOLN
INTRAVENOUS | Status: DC
  Administered 2014-12-27: 23:00:00 via INTRAVENOUS

## 2014-12-27 MED ORDER — AMLODIPINE BESYLATE 10 MG PO TABS
10.0000 mg | ORAL_TABLET | Freq: Every day | ORAL | Status: DC
  Administered 2014-12-30 – 2015-01-04 (×5): 10 mg via ORAL
  Filled 2014-12-27 (×11): qty 1

## 2014-12-27 MED ORDER — IPRATROPIUM-ALBUTEROL 0.5-2.5 (3) MG/3ML IN SOLN
3.0000 mL | Freq: Four times a day (QID) | RESPIRATORY_TRACT | Status: DC
  Administered 2014-12-28: 3 mL via RESPIRATORY_TRACT
  Filled 2014-12-27 (×2): qty 3

## 2014-12-27 MED ORDER — HEPARIN SODIUM (PORCINE) 5000 UNIT/ML IJ SOLN
5000.0000 [IU] | Freq: Three times a day (TID) | INTRAMUSCULAR | Status: DC
  Administered 2014-12-28 – 2015-01-04 (×18): 5000 [IU] via SUBCUTANEOUS
  Filled 2014-12-27 (×31): qty 1

## 2014-12-27 MED ORDER — SODIUM CHLORIDE 0.9 % IV SOLN: 250.0000 mL | INTRAVENOUS | Status: DC | PRN

## 2014-12-27 MED ORDER — NICARDIPINE HCL IN NACL 20-0.86 MG/200ML-% IV SOLN
3.0000 mg/h | INTRAVENOUS | Status: DC
  Administered 2014-12-27: 5 mg/h via INTRAVENOUS
  Filled 2014-12-27: qty 200

## 2014-12-27 MED ORDER — ACETAMINOPHEN 325 MG PO TABS: 650.0000 mg | ORAL_TABLET | ORAL | Status: DC | PRN

## 2014-12-27 MED ORDER — DILTIAZEM HCL 100 MG IV SOLR
5.0000 mg/h | Freq: Once | INTRAVENOUS | Status: AC
  Administered 2014-12-27: 5 mg/h via INTRAVENOUS

## 2014-12-27 MED ORDER — ZIPRASIDONE MESYLATE 20 MG IM SOLR
10.0000 mg | Freq: Once | INTRAMUSCULAR | Status: AC
  Administered 2014-12-27: 10 mg via INTRAMUSCULAR

## 2014-12-27 MED ORDER — DEXTROSE 5 % IV SOLN
0.5000 mg/min | INTRAVENOUS | Status: DC
  Administered 2014-12-27: 0.5 mg/min via INTRAVENOUS
  Filled 2014-12-27: qty 100

## 2014-12-27 MED ORDER — LISINOPRIL 20 MG PO TABS
20.0000 mg | ORAL_TABLET | Freq: Every day | ORAL | Status: DC
  Administered 2014-12-30 – 2015-01-04 (×5): 20 mg via ORAL
  Filled 2014-12-27 (×10): qty 1

## 2014-12-27 MED ORDER — FAMOTIDINE IN NACL 20-0.9 MG/50ML-% IV SOLN
20.0000 mg | Freq: Two times a day (BID) | INTRAVENOUS | Status: DC
  Administered 2014-12-28 – 2014-12-29 (×4): 20 mg via INTRAVENOUS
  Filled 2014-12-27 (×6): qty 50

## 2014-12-27 MED ORDER — ZIPRASIDONE MESYLATE 20 MG IM SOLR
20.0000 mg | Freq: Four times a day (QID) | INTRAMUSCULAR | Status: DC | PRN
  Filled 2014-12-27 (×2): qty 20

## 2014-12-27 MED ORDER — ONDANSETRON HCL 4 MG/2ML IJ SOLN
4.0000 mg | Freq: Four times a day (QID) | INTRAMUSCULAR | Status: DC | PRN
Start: 2014-12-27 — End: 2015-01-04

## 2014-12-27 MED ORDER — ZIPRASIDONE MESYLATE 20 MG IM SOLR
INTRAMUSCULAR | Status: AC
  Filled 2014-12-27: qty 20

## 2014-12-27 NOTE — ED Notes (Signed)
Bed: [id number] Expected date: 12/27/14 Expected time: 3:29 PM Means of arrival: Ambulance Comments: ?OD

## 2014-12-27 NOTE — ED Notes (Signed)
Cardene infusion started, see MAR EDP aware of manual BP

## 2014-12-27 NOTE — ED Notes (Signed)
HR [address] updated regarding additional raise in HR.

## 2014-12-27 NOTE — ED Notes (Signed)
[facility], one will be arriving shortly.

## 2014-12-27 NOTE — ED Notes (Signed)
Awaiting arrival of medication from infusion

## 2014-12-27 NOTE — ED Notes (Signed)
Critical care consult at bedside

## 2014-12-27 NOTE — ED Notes (Signed)
Pt transported to CT ?

## 2014-12-27 NOTE — ED Provider Notes (Signed)
CSN: [id number]     Arrival date & time 12/27/14  1531 History   First MD Initiated Contact with Patient 12/27/14 1532     Chief Complaint  Patient presents with  . Suicide Attempt     (Consider location/radiation/quality/duration/timing/severity/associated sxs/prior Treatment) Patient is a 59 y.o. female presenting with altered mental status. The history is provided by the EMS personnel.  Altered Mental Status Presenting symptoms: confusion   Severity:  Moderate Most recent episode:  2 days ago Episode history:  Continuous Timing:  Constant Progression:  Unchanged Context: not alcohol use, not dementia, not a recent change in medication and not a recent illness     Past Medical History  Diagnosis Date  . Smoking history   . Aortic valve stenosis   . Back pain, chronic   . Chest pain   . SOB (shortness of breath)   . Coronary artery disease     mild; non-hemodynamically significant  . CHF (congestive heart failure)   . Dyslipidemia   . Thyroid disease     hypothyroidism  . Barrett esophagus   . GERD (gastroesophageal reflux disease)     with PUD  . PUD (peptic ulcer disease)   . Depression   . Deaf   . Sinus congestion   . Wheezing   . Rash, skin     2 weeks ago, bilateral hand rash, peeling, eruptions   . Seizures     as a child at age 28  . Hypothyroidism   . Hypertension   . Arthritis     back, hips, legs  . PONV (postoperative nausea and vomiting)    Past Surgical History  Procedure Laterality Date  . Cholecystectomy    . Cesarean section      x 2  . Vaginal hysterectomy    . Tonsillectomy and adenoidectomy      at age 19  . Aortic valve replacement  02/20/2012    Procedure: AORTIC VALVE REPLACEMENT (AVR);  Surgeon: Loreli Slot, MD;  Location: [facility] OR;  Service: Open Heart Surgery;  Laterality: N/A;  . Cardiac catheterization  4/2011l;12/2011   Family History  Problem Relation Age of Onset  . Heart attack Father   . Heart disease Neg Hx    heart valve problems in other family members  . Dementia Mother   . Hypertension Mother    History  Substance Use Topics  . Smoking status: Current Every Day Smoker -- 0.50 packs/day for 30 years    Types: Cigarettes  . Smokeless tobacco: Not on file  . Alcohol Use: No   OB History    No data available     Review of Systems  Unable to perform ROS: Mental status change  Psychiatric/Behavioral: Positive for confusion.      Allergies  Chlorine and Other  Home Medications   Prior to Admission medications   Medication Sig Start Date End Date Taking? Authorizing Provider  amLODipine (NORVASC) 10 MG tablet Take 1 tablet (10 mg total) by mouth daily. 01/01/13   Antonieta Iba, MD  benazepril (LOTENSIN) 40 MG tablet Take 1 tablet (40 mg total) by mouth daily. 01/01/13 05/09/14  Antonieta Iba, MD  gabapentin (NEURONTIN) 300 MG capsule Tales 1-3 tablets daily at bedtime.    Historical Provider, MD  levothyroxine (SYNTHROID, LEVOTHROID) 50 MCG tablet Take 1 tablet (50 mcg total) by mouth daily. 02/29/12   Wayne E Gold, PA-C  omeprazole (PRILOSEC) 20 MG capsule Take 1 capsule (20 mg total) by  mouth daily. 02/29/12   Wayne E Gold, PA-C  traMADol (ULTRAM) 50 MG tablet TAKE 1 TO 2 TABLETS BY MOUTH EVERY 6 HOURS AS NEEDED FOR PAIN 04/03/12   Loreli Slot, MD   There were no vitals taken for this visit. Physical Exam  Constitutional: She appears well-developed and well-nourished. No distress.  HENT:  Head: Normocephalic and atraumatic.  Mouth/Throat: Oropharynx is clear and moist.  Eyes: EOM are normal. Pupils are equal, round, and reactive to light.  Neck: Normal range of motion. Neck supple.  Cardiovascular: Normal rate and regular rhythm.  Exam reveals no friction rub.   No murmur heard. Pulmonary/Chest: Effort normal and breath sounds normal. No respiratory distress. She has no wheezes. She has no rales.  Abdominal: Soft. She exhibits no distension. There is no tenderness. There  is no rebound.  Musculoskeletal: Normal range of motion. She exhibits no edema.  Neurological: She is alert. No cranial nerve deficit. She exhibits normal muscle tone. GCS eye subscore is 4. GCS verbal subscore is 4. GCS motor subscore is 6.  Moving all extremities.  No groos cranial nerve deficits. Not cooperative to complete full neuro exam.  Skin: No rash noted. She is not diaphoretic.  Nursing note and vitals reviewed.   ED Course  Procedures (including critical care time) Labs Review Labs Reviewed  CBC  COMPREHENSIVE METABOLIC PANEL  ETHANOL  ACETAMINOPHEN LEVEL  SALICYLATE LEVEL  URINE RAPID DRUG SCREEN (HOSP PERFORMED)  URINALYSIS, ROUTINE W REFLEX MICROSCOPIC  CBG MONITORING, ED    Imaging Review Ct Head Wo Contrast  12/27/2014   CLINICAL DATA:  Altered mental status, overdose  EXAM: CT HEAD WITHOUT CONTRAST  TECHNIQUE: Contiguous axial images were obtained from the base of the skull through the vertex without intravenous contrast.  COMPARISON:  02/22/2014  FINDINGS: No skull fracture is noted. No intracranial hemorrhage, mass effect or midline shift.  Paranasal sinuses are unremarkable. Stable chronic opacification of left mastoid air cells. Atherosclerotic calcifications of carotid siphon again noted.  No definite acute cortical infarction. No mass lesion is noted on this unenhanced scan. Stable mild periventricular white matter decreased attenuation probable due to chronic small vessel ischemic changes. Stable subtle hypodensity bilateral frontal lobes probable due to prior small infarcts.  IMPRESSION: No acute intracranial abnormality. Mild cerebral atrophy. No definite acute cortical infarction. No mass lesion is noted on this unenhanced scan.   Electronically Signed   By: Natasha Mead M.D.   On: 12/27/2014 19:02   Dg Chest Port 1 View  12/27/2014   CLINICAL DATA:  Altered mental status  EXAM: PORTABLE CHEST - 1 VIEW  COMPARISON:  12/18/2014  FINDINGS: The heart size and  mediastinal contours are within normal limits. Both lungs are clear. The visualized skeletal structures are unremarkable. Cardiac leads obscure detail. Evidence of CABG reidentified.  IMPRESSION: No active disease.   Electronically Signed   By: Christiana Pellant M.D.   On: 12/27/2014 16:53     EKG Interpretation   Date/Time:  Saturday December 27 2014 17:31:26 EDT Ventricular Rate:  110 PR Interval:  107 QRS Duration: 87 QT Interval:  333 QTC Calculation: 450 R Axis:   53 Text Interpretation:  Sinus tachycardia Multiple premature complexes, vent   Right atrial enlargement Probable LVH with secondary repol abnrm Anterior  Q waves, possibly due to LVH ST depr, consider ischemia, inferior leads  Artifact in lead(s) I II III aVR aVL aVF Similar to prior Confirmed by  Gwendolyn Grant  MD, Tiyonna Sardinha ([id number]) on  12/27/2014 11:00:16 PM     CRITICAL CARE Performed by: Dagmar HaitWALDEN, WILLIAM Katelyn Kohlmeyer   Total critical care time: 45 minutes  Critical care time was exclusive of separately billable procedures and treating other patients.  Critical care was necessary to treat or prevent imminent or life-threatening deterioration.  Critical care was time spent personally by me on the following activities: development of treatment plan with patient and/or surrogate as well as nursing, discussions with consultants, evaluation of patient's response to treatment, examination of patient, obtaining history from patient or surrogate, ordering and performing treatments and interventions, ordering and review of laboratory studies, ordering and review of radiographic studies, pulse oximetry and re-evaluation of patient's condition.  MDM   Final diagnoses:  Altered mental status  Hypertensive urgency    59 year old female here after a possible drug overdose. Does have history of suicide attempt. Family states she's not been acting right for the past 2-3 days. Concern for possible overdose because she is almost out of her gabapentin.  She should have 36 left in the bottle. She is also missing 4 of her levothyroxine. These figures would be if she's been taking all of the medicine as prescribed. Here she is continually trying to climb out of the bed. She is moving all extremities and refusing to speak to me. She does not follow commands. She does not hold one's gaze for a long period. Concern for possible drug overdose vs other means of altered mental status. Restraints applied and geodon given since patient is trying to climb out of bed continually and yelling at staff.  Labs returning fairly unremarkable. Having intermittent episodes of tachycardia, looks like Aflutter vs SVT. Also having increasingly high BPs. Nicardipine started with good control of HR and BP.  Plan on admission to ICU since on drip to sustain BP at reasonable level.  Elwin MochaBlair Chaynce Schafer, MD 12/27/14 2300

## 2014-12-27 NOTE — H&P (Addendum)
PULMONARY / CRITICAL CARE MEDICINE   Name: Abigail PoeDonna S Schwoerer MRN: [id number] DOB: 1956-09-15    ADMISSION DATE:  12/27/2014 CONSULTATION DATE:  12/27/14  REFERRING MD :  ED  CHIEF COMPLAINT:  Altered mental status  INITIAL PRESENTATION:   STUDIES:  CT head 12/27/14: No acute intracranial abnormality. Mild cerebral atrophy. No definite acute cortical infarction. No mass lesion is noted on this unenhanced scan.  SIGNIFICANT EVENTS: 12/27/14: labetalol drip started  HISTORY OF PRESENT ILLNESS:  Patient cannot give history right now, history taken from her daughter bedside. This is a 89F with multiple medical history, who apparently also has chronic pain on her back and leg. Apparently about 2 days ago she started becoming combative and had some headache. No fever, no chills, no chest pain, no vision changes, no cough, no N/V, no abd pain, no diarrhea, no syncope. No seizures. According to her daughter, usually when she acts like this it means she is taking too much of her pain medications and she also sometimes take medications from other people for pain. Her daughter did a pill count and she is missing a lot of gabapentin and some levothyroxine pills. Also maybe a few pills of opiates. Also she has not been taking her HTN meds for at least 4 days. CT head no acute findings. She was very combative in ED needing some antipsychotic and also very hypertensive needing cardene drip. Urine positive only for opiates. Serum TSH normal, FT4 only slightly elevated at 1.9  PAST MEDICAL HISTORY :   has a past medical history of Smoking history; Aortic valve stenosis; Back pain, chronic; Chest pain; SOB (shortness of breath); Coronary artery disease; CHF (congestive heart failure); Dyslipidemia; Thyroid disease; Barrett esophagus; GERD (gastroesophageal reflux disease); PUD (peptic ulcer disease); Depression; Deaf; Sinus congestion; Wheezing; Rash, skin; Seizures; Hypothyroidism; Hypertension; Arthritis; and  PONV (postoperative nausea and vomiting).  has past surgical history that includes Cholecystectomy; Cesarean section; Vaginal hysterectomy; Tonsillectomy and adenoidectomy; Aortic valve replacement (02/20/2012); and Cardiac catheterization (4/2011l;12/2011). Prior to Admission medications   Medication Sig Start Date End Date Taking? Authorizing Provider  beclomethasone (QVAR) 80 MCG/ACT inhaler Inhale 1 puff into the lungs 2 (two) times daily. 12/18/14 12/18/15 Yes Historical Provider, MD  gabapentin (NEURONTIN) 300 MG capsule Tales 1-3 tablets daily at bedtime.   Yes Historical Provider, MD  HYDROcodone-acetaminophen (NORCO) 10-325 MG per tablet Take 1 tablet by mouth daily as needed for moderate pain or severe pain.  12/10/14  Yes Historical Provider, MD  levothyroxine (SYNTHROID, LEVOTHROID) 50 MCG tablet Take 1 tablet (50 mcg total) by mouth daily. 02/29/12  Yes Wayne E Gold, PA-C  lisinopril (PRINIVIL,ZESTRIL) 20 MG tablet Take 1 tablet by mouth daily. 10/06/14  Yes Historical Provider, MD  omeprazole (PRILOSEC) 20 MG capsule Take 1 capsule (20 mg total) by mouth daily. 02/29/12  Yes Wayne E Gold, PA-C  amLODipine (NORVASC) 10 MG tablet Take 1 tablet (10 mg total) by mouth daily. 01/01/13   Antonieta Ibaimothy J Gollan, MD  benazepril (LOTENSIN) 40 MG tablet Take 1 tablet (40 mg total) by mouth daily. 01/01/13 05/09/14  Antonieta Ibaimothy J Gollan, MD  traMADol (ULTRAM) 50 MG tablet TAKE 1 TO 2 TABLETS BY MOUTH EVERY 6 HOURS AS NEEDED FOR PAIN 04/03/12   Loreli SlotSteven C Hendrickson, MD   Allergies  Allergen Reactions  . Chlorine     Bleach, rash hands  . Other     CHG hand soap - rash    FAMILY HISTORY:  indicated that her mother is  alive. She indicated that her brother is alive. She indicated that her maternal grandmother is deceased. She indicated that her maternal grandfather is deceased. She indicated that her paternal grandmother is deceased. She indicated that her paternal grandfather is deceased.  SOCIAL HISTORY:  reports that  she has been smoking Cigarettes.  She has a 15 pack-year smoking history. She does not have any smokeless tobacco history on file. She reports that she does not drink alcohol or use illicit drugs.  REVIEW OF SYSTEMS:  Patient uncooperative, unable to obtain RoS  SUBJECTIVE: comfortable at rest  VITAL SIGNS: Temp:  [98.5 F (36.9 C)] 98.5 F (36.9 C) (03/26 1550) Pulse Rate:  [77-153] 107 (03/26 2200) Resp:  [16-26] 18 (03/26 2200) BP: (167-248)/(85-217) 180/85 mmHg (03/26 2200) SpO2:  [93 %-100 %] 100 % (03/26 2200) HEMODYNAMICS:   VENTILATOR SETTINGS:   INTAKE / OUTPUT: No intake or output data in the 24 hours ending 12/27/14 2232  PHYSICAL EXAMINATION: General:  No acute distress at rest, well developed Neuro:  No focal weakness, no facial droop, refuses to answer questions or follow commands HEENT:  Atraumatic, no stridor Cardiovascular:  RRR, soft 1/6 systolic murmur Lungs:  Clear, no wheeze Abdomen:  Soft, no tenderness, no guarding Musculoskeletal:  No gross deformities, no leg edema Skin:  No acute rash, no ecchymosis  LABS:  CBC  Recent Labs Lab 12/27/14 1557  WBC 8.4  HGB 14.7  HCT 43.6  PLT 332   Coag's No results for input(s): APTT, INR in the last 168 hours. BMET  Recent Labs Lab 12/27/14 1557  NA 133*  K 4.1  CL 102  CO2 19  BUN 12  CREATININE 0.94  GLUCOSE 87   Electrolytes  Recent Labs Lab 12/27/14 1557  CALCIUM 9.1   Sepsis Markers No results for input(s): LATICACIDVEN, PROCALCITON, O2SATVEN in the last 168 hours. ABG No results for input(s): PHART, PCO2ART, PO2ART in the last 168 hours. Liver Enzymes  Recent Labs Lab 12/27/14 1557  AST 33  ALT 13  ALKPHOS 136*  BILITOT 0.9  ALBUMIN 3.9   Cardiac Enzymes No results for input(s): TROPONINI, PROBNP in the last 168 hours. Glucose  Recent Labs Lab 12/27/14 1617  GLUCAP 88    Imaging No results found.   ASSESSMENT / PLAN:  PULMONARY A: smoker, unclear if she  has COPD P:   Scheduled duoneb  CARDIOVASCULAR A: Hypertensive emergency likely from missing her HTN meds, quesionable contribution from too much levothyroxine P:  Labetalol drip for now, probably this is better than cardene drip given that there's possibility of levothyroxine overdose. Restart amlodipine and lisinopril  RENAL A:  No acute issues P:   Monitor electrolytes  GASTROINTESTINAL A:  No acute issues P:   Clear liquids for now  HEMATOLOGIC A:  No acute issues P:  monitor  INFECTIOUS A:  No acute issues P:   BCx2  UC  Sputum Abx:   ENDOCRINE A:  History of hypothyroidism, now has mild levothyroxine overdose P:   Stop levothyroxine for now, restart in a few days when she is better   NEUROLOGIC A:  Altered mental status, can be from hypertensive emergency vs from medications overdose. Less likely PRES syndrome P:   For now will stop all medications that can alter mental status. Use Geodon PRN. Frequent reorientation. Control BP with labetalol drip. No need MRI brain for now, but if she gets worse or not better in 2-3 days then will consider this to rule out  other intracranial pathology such as PRES syndrome or small CVA RASS goal: 0 to +1   FAMILY  - Updates:   - Inter-disciplinary family meet or Palliative Care meeting due by:      TODAY'S SUMMARY: admitted for altered mental status, possible causes: hypertensive emergency vs multidrug overdose (gabapentin, levothyroxine, pain medications). Less likely PRES syndrome. On labetalol drip.    Critical Care time: 35 minutes  Pulmonary and Critical Care Medicine [facility] Pager: [phone]  12/27/2014, 10:32 PM

## 2014-12-27 NOTE — ED Notes (Signed)
[facility] called

## 2014-12-27 NOTE — ED Notes (Addendum)
Pt HR 195 EKG captured and [facility] to [facility] who is at bedside during episode. Pt same orientation (unable to assess); still responds to name as on arrival; no change in orientation since arrival.

## 2014-12-27 NOTE — ED Notes (Addendum)
Pt restless sitting on edge of bed refusing to cooperate with staff. Bilateral hard wrist restraints reapplied.

## 2014-12-27 NOTE — ED Notes (Signed)
All restraints released.

## 2014-12-27 NOTE — ED Notes (Signed)
MD at bedside. 

## 2014-12-27 NOTE — ED Notes (Signed)
Pt responds to name but does not answer ANY questions appropriated thus unable to assess orientation.

## 2014-12-27 NOTE — ED Notes (Signed)
Family (daughter and son) at bedside.

## 2014-12-27 NOTE — ED Notes (Signed)
Patient's family at bedside asking how much medication patient took prior to arrival Family informed several times that there is no way to indicate the exact number of pills and what pills patient ingested Per family, patient has history of taking opiates that are not prescribed to her Family also states that they want a Child psychotherapistsocial worker to come speak to patient about numerous issues Dr. Gwendolyn GrantWalden made aware and MD to come speak with family

## 2014-12-27 NOTE — ED Notes (Signed)
EMS reports family states pt "has not been acting right" last 2-3 days. Daughter noticed she did not have the correct amount of meds in her bottles. Gabapentin 300mg  Should have 36 left in bottle, only has 2, Also missing 4 of her Levothroxin. Pt would not talk to EMS, pt trying to get out of bed. Unsure if intentional or accidental.

## 2014-12-27 NOTE — ED Notes (Signed)
Per EDP, Cardene infusion to be decreased to 2.5 mg per hour [facility]

## 2014-12-27 NOTE — ED Notes (Signed)
Per PC   Gabapentin may see drowsiness and GI upset Levothyroxine may see tachycardia, tremors after 72 hours  EKG, acetaminophen, CK, electrolytes,, IV fluids as tolerated, Benzo for agitation if not pyschiatric related

## 2014-12-27 NOTE — ED Notes (Signed)
Patient's HR increases when she holds her breath

## 2014-12-28 ENCOUNTER — Encounter (HOSPITAL_COMMUNITY): Payer: Self-pay | Admitting: *Deleted

## 2014-12-28 DIAGNOSIS — G9340 Encephalopathy, unspecified: Secondary | ICD-10-CM

## 2014-12-28 DIAGNOSIS — I10 Essential (primary) hypertension: Secondary | ICD-10-CM

## 2014-12-28 DIAGNOSIS — T50904D Poisoning by unspecified drugs, medicaments and biological substances, undetermined, subsequent encounter: Secondary | ICD-10-CM

## 2014-12-28 LAB — COMPREHENSIVE METABOLIC PANEL
ALT: 10 U/L (ref 0–35)
ANION GAP: 8 (ref 5–15)
AST: 26 U/L (ref 0–37)
Albumin: 3.1 g/dL — ABNORMAL LOW (ref 3.5–5.2)
Alkaline Phosphatase: 125 U/L — ABNORMAL HIGH (ref 39–117)
BUN: 8 mg/dL (ref 6–23)
CO2: 24 mmol/L (ref 19–32)
Calcium: 8.8 mg/dL (ref 8.4–10.5)
Chloride: 103 mmol/L (ref 96–112)
Creatinine, Ser: 1.05 mg/dL (ref 0.50–1.10)
GFR, EST AFRICAN AMERICAN: 66 mL/min — AB (ref >90)
GFR, EST NON AFRICAN AMERICAN: 57 mL/min — AB (ref >90)
GLUCOSE: 111 mg/dL — AB (ref 70–99)
Potassium: 3.9 mmol/L (ref 3.5–5.1)
Sodium: 135 mmol/L (ref 135–145)
Total Bilirubin: 0.8 mg/dL (ref 0.3–1.2)
Total Protein: 6.2 g/dL (ref 6.0–8.3)

## 2014-12-28 LAB — BASIC METABOLIC PANEL
ANION GAP: 11 (ref 5–15)
BUN: 9 mg/dL (ref 6–23)
CALCIUM: 9 mg/dL (ref 8.4–10.5)
CHLORIDE: 99 mmol/L (ref 96–112)
CO2: 23 mmol/L (ref 19–32)
Creatinine, Ser: 0.95 mg/dL (ref 0.50–1.10)
GFR, EST AFRICAN AMERICAN: 75 mL/min — AB (ref >90)
GFR, EST NON AFRICAN AMERICAN: 64 mL/min — AB (ref >90)
Glucose, Bld: 110 mg/dL — ABNORMAL HIGH (ref 70–99)
Potassium: 4.1 mmol/L (ref 3.5–5.1)
SODIUM: 133 mmol/L — AB (ref 135–145)

## 2014-12-28 LAB — CBC
HCT: 38.9 % (ref 36.0–46.0)
Hemoglobin: 13 g/dL (ref 12.0–15.0)
MCH: 29.1 pg (ref 26.0–34.0)
MCHC: 33.4 g/dL (ref 30.0–36.0)
MCV: 87 fL (ref 78.0–100.0)
Platelets: 283 10*3/uL (ref 150–400)
RBC: 4.47 MIL/uL (ref 3.87–5.11)
RDW: 15.3 % (ref 11.5–15.5)
WBC: 8.2 10*3/uL (ref 4.0–10.5)

## 2014-12-28 LAB — CBG MONITORING, ED: Glucose-Capillary: 104 mg/dL — ABNORMAL HIGH (ref 70–99)

## 2014-12-28 LAB — MAGNESIUM
MAGNESIUM: 1.5 mg/dL (ref 1.5–2.5)
MAGNESIUM: 2.8 mg/dL — AB (ref 1.5–2.5)

## 2014-12-28 LAB — MRSA PCR SCREENING: MRSA BY PCR: NEGATIVE

## 2014-12-28 LAB — PHOSPHORUS: Phosphorus: 4 mg/dL (ref 2.3–4.6)

## 2014-12-28 LAB — LACTIC ACID, PLASMA: Lactic Acid, Venous: 2 mmol/L (ref 0.5–2.0)

## 2014-12-28 MED ORDER — IPRATROPIUM-ALBUTEROL 0.5-2.5 (3) MG/3ML IN SOLN
3.0000 mL | Freq: Four times a day (QID) | RESPIRATORY_TRACT | Status: DC
  Administered 2014-12-28 – 2014-12-29 (×4): 3 mL via RESPIRATORY_TRACT
  Filled 2014-12-28 (×4): qty 3

## 2014-12-28 MED ORDER — BIOTENE DRY MOUTH MT LIQD
15.0000 mL | OROMUCOSAL | Status: DC
  Administered 2014-12-28 – 2015-01-04 (×26): 15 mL via OROMUCOSAL

## 2014-12-28 MED ORDER — ZIPRASIDONE MESYLATE 20 MG IM SOLR
20.0000 mg | Freq: Four times a day (QID) | INTRAMUSCULAR | Status: DC | PRN
  Filled 2014-12-28: qty 20

## 2014-12-28 MED ORDER — DEXTROSE-NACL 5-0.45 % IV SOLN
INTRAVENOUS | Status: DC
  Administered 2014-12-28: 12:00:00 via INTRAVENOUS

## 2014-12-28 MED ORDER — SODIUM CHLORIDE 0.9 % IV SOLN
INTRAVENOUS | Status: DC
  Administered 2014-12-28 – 2015-01-02 (×2): via INTRAVENOUS

## 2014-12-28 MED ORDER — DEXTROSE-NACL 5-0.9 % IV SOLN
INTRAVENOUS | Status: DC
  Administered 2014-12-28: 23:00:00 via INTRAVENOUS

## 2014-12-28 MED ORDER — MAGNESIUM SULFATE 50 % IJ SOLN
6.0000 g | Freq: Once | INTRAMUSCULAR | Status: AC
  Administered 2014-12-28: 6 g via INTRAVENOUS
  Filled 2014-12-28: qty 12

## 2014-12-28 MED ORDER — LABETALOL HCL 5 MG/ML IV SOLN
5.0000 mg | INTRAVENOUS | Status: DC | PRN
  Administered 2014-12-29 (×2): 5 mg via INTRAVENOUS
  Filled 2014-12-28: qty 4

## 2014-12-28 NOTE — Progress Notes (Signed)
Utilization review completed.  

## 2014-12-28 NOTE — Progress Notes (Signed)
Patient refusing care such as medication, vital signs, etc.  Belligerent and confused.  Unable to comprehend instructions and shouting expletives at caregivers.  Pulling at IV lines and attempting to get OOB.  Wants "go go home" and "smoke".  Daughter called and states will be by this afternoon.  MD aware of patient's status.

## 2014-12-28 NOTE — Progress Notes (Signed)
eLink Physician-Brief Progress Note Patient Name: Abigail PoeDonna S Shepardson DOB: 1956-08-05 MRN: [id number]   Date of Service  12/28/2014  HPI/Events of Note  Na+ = 133, K+ = 4.1 and Mg++ = 2.5.  eICU Interventions  Will change IV fluid to D5 0.9 NaCl to run IV at 50 mL/hour.      Intervention Category Intermediate Interventions: Electrolyte abnormality - evaluation and management  Sommer,Steven Eugene 12/28/2014, 10:50 PM

## 2014-12-28 NOTE — Progress Notes (Signed)
PSY consult called  Canary BrimBrandi Sheniece Ruggles, NP-C [facility] Pgr: [phone] or [phone]

## 2014-12-28 NOTE — Progress Notes (Signed)
Called MD Arsenio LoaderSommer in regards to recurrent episodes of tachycardia 130s-140s. Ordered a Restaurant manager, fast foodBMET and Mag. Will continue to monitor and assess.

## 2014-12-28 NOTE — Progress Notes (Signed)
[facility] FOR AM LAB REPLACEMENT ONLY  The patient does [facility] Protocol based on the criteria listed below:   1. Is GFR >/= 40 ml/min? Yes.    Patient's GFR today is 62 2. Is urine output >/= 0.5 ml/kg/hr for the last 6 hours? Yes.   Patient's UOP is  ml/kg/hr 3. Is BUN < 60 mg/dL? Yes.    Patient's BUN today is 12 4. Abnormal electrolyte(s): Mg 1.5 5. Ordered repletion with: per protocol 6. If a panic level lab has been reported, has the CCM MD in charge been notified? No..   Physician:    Markus DaftWHELAN, Kamin Niblack A 12/28/2014 4:34 AM \

## 2014-12-28 NOTE — Progress Notes (Signed)
eLink Physician-Brief Progress Note Patient Name: Abigail PoeDonna S [name] DOB: [birth year] MRN: [id number]   Date of Service  12/28/2014  HPI/Events of Note  HR transiently increased to 130 to 140. Rhythm unknown. Now NSR with rate = 73.   eICU Interventions  Will order: 1. BMP and Magnesium level now.      Intervention Category Intermediate Interventions: Arrhythmia - evaluation and management  Sommer,Steven Dennard Nipugene 12/28/2014, 8:36 PM

## 2014-12-28 NOTE — Progress Notes (Signed)
PULMONARY / CRITICAL CARE MEDICINE   Name: [name] MRN: [id number] DOB: 1956-04-10    ADMISSION DATE:  12/27/2014 CONSULTATION DATE:  12/27/14  REFERRING MD :  ED  CHIEF COMPLAINT:  Altered mental status  INITIAL PRESENTATION:   STUDIES:  3/26  CT Head >> No acute intracranial abnormality. Mild cerebral atrophy. No definite acute cortical infarction. No mass lesion is noted on this unenhanced scan.  SIGNIFICANT EVENTS: 3/26  Admit with OD, HTN required labetalol gtt, combative in ER.     SUBJECTIVE:  RN reports pt is awake, limited interaction, follows / tracks and will speak with stimulation.  VSS on labetalol   VITAL SIGNS: Temp:  [97.5 F (36.4 C)-98.8 F (37.1 C)] 98.2 F (36.8 C) (03/27 0800) Pulse Rate:  [65-153] 91 (03/27 1100) Resp:  [13-29] 20 (03/27 1100) BP: (118-248)/(56-217) 136/56 mmHg (03/27 1100) SpO2:  [93 %-100 %] 98 % (03/27 1100) Weight:  [133 lb 9.6 oz (60.6 kg)] 133 lb 9.6 oz (60.6 kg) (03/27 0100)  INTAKE / OUTPUT:  Intake/Output Summary (Last 24 hours) at 12/28/14 1124 Last data filed at 12/28/14 1100  Gross per 24 hour  Intake 1059.48 ml  Output      0 ml  Net 1059.48 ml    PHYSICAL EXAMINATION: General:  No acute distress at rest, well developed Neuro:  No focal weakness, no facial droop, refuses to answer questions or follow commands.  MAE spontaneously  HEENT:  Atraumatic, no stridor Cardiovascular:  RRR, soft 1/6 systolic murmur Lungs:  Clear, no wheeze Abdomen:  Soft, no tenderness, no guarding Musculoskeletal:  No gross deformities, no leg edema Skin:  No acute rash, no ecchymosis  LABS:  CBC  Recent Labs Lab 12/27/14 1557 12/28/14 0240  WBC 8.4 8.2  HGB 14.7 13.0  HCT 43.6 38.9  PLT 332 283   BMET  Recent Labs Lab 12/27/14 1557 12/28/14 0240  NA 133* 135  K 4.1 3.9  CL 102 103  CO2 19 24  BUN 12 8  CREATININE 0.94 1.05  GLUCOSE 87 111*   Electrolytes  Recent Labs Lab 12/27/14 1557  12/28/14 0240  CALCIUM 9.1 8.8  MG  --  1.5  PHOS  --  4.0   Sepsis Markers  Recent Labs Lab 12/28/14 0240  LATICACIDVEN 2.0   Liver Enzymes  Recent Labs Lab 12/27/14 1557 12/28/14 0240  AST 33 26  ALT 13 10  ALKPHOS 136* 125*  BILITOT 0.9 0.8  ALBUMIN 3.9 3.1*   Glucose  Recent Labs Lab 12/27/14 1617 12/28/14 0014  GLUCAP 88 104*    Imaging Ct Head Wo Contrast  12/27/2014   CLINICAL DATA:  Altered mental status, overdose  EXAM: CT HEAD WITHOUT CONTRAST  TECHNIQUE: Contiguous axial images were obtained from the base of the skull through the vertex without intravenous contrast.  COMPARISON:  02/22/2014  FINDINGS: No skull fracture is noted. No intracranial hemorrhage, mass effect or midline shift.  Paranasal sinuses are unremarkable. Stable chronic opacification of left mastoid air cells. Atherosclerotic calcifications of carotid siphon again noted.  No definite acute cortical infarction. No mass lesion is noted on this unenhanced scan. Stable mild periventricular white matter decreased attenuation probable due to chronic small vessel ischemic changes. Stable subtle hypodensity bilateral frontal lobes probable due to prior small infarcts.  IMPRESSION: No acute intracranial abnormality. Mild cerebral atrophy. No definite acute cortical infarction. No mass lesion is noted on this unenhanced scan.   Electronically Signed   By: Lang SnowLiviu  Pop M.D.   On: 12/27/2014 19:02   Dg Chest Port 1 View  12/27/2014   CLINICAL DATA:  Altered mental status  EXAM: PORTABLE CHEST - 1 VIEW  COMPARISON:  12/18/2014  FINDINGS: The heart size and mediastinal contours are within normal limits. Both lungs are clear. The visualized skeletal structures are unremarkable. Cardiac leads obscure detail. Evidence of CABG reidentified.  IMPRESSION: No active disease.   Electronically Signed   By: Christiana Pellant M.D.   On: 12/27/2014 16:53     ASSESSMENT / PLAN:  PULMONARY A:  Tobacco Abuse - no definitive  dx of COPD P:   Duoneb Q6  Hold home QVAR Oxygen as needed to support sats > 90%  CARDIOVASCULAR A:  Hypertensive emergency - likely from missing her HTN meds, quesionable contribution from too much levothyroxine P:  Wean Labetalol gtt to off Continue amlodipine and lisinopril  RENAL A:   No acute issues P:   Monitor electrolytes  GASTROINTESTINAL A:   No acute issues P:   Clear liquids for now  HEMATOLOGIC A:   No acute issues P:  Monitor CBC  INFECTIOUS A:   No acute issues P:   Monitor fever curve / WBC   ENDOCRINE A:   History of hypothyroidism, now has mild levothyroxine overdose P:   Hold Levothyroxine for now, restart in am 3/28    NEUROLOGIC A:   Altered mental status -hypertensive emergency vs from medications overdose. Less likely PRES syndrome. Resolving.  Questionable intentional OD  P:   Frequent reorientation. BP control  No need MRI brain for now, but if she gets worse or not better in 2-3 days then will consider this to rule out other intracranial pathology such as PRES syndrome or small CVA Will need PSY eval once more alert   FAMILY  - Updates: no family available    Consider tx out of ICU if can wean labetalol off this PM.   Canary Brim, NP-C [facility] Pgr: [phone] or [phone]  Remains on labetalol drip for BP reduction.  Decreasing dose however.  If off this after noon then may transfer out of the ICU to SDU.  Will need psych to see and will start PO anti-HTN with the hope that we can stop labetalol drip.  Monitor airway closely.  The patient is critically ill with multiple organ systems failure and requires high complexity decision making for assessment and support, frequent evaluation and titration of therapies, application of advanced monitoring technologies and extensive interpretation of multiple databases.   Critical Care Time devoted to patient care services described in this note is  35  Minutes.  This time reflects time of care of this signee Dr Koren Bound. This critical care time does not reflect procedure time, or teaching time or supervisory time of PA/NP/Med student/Med Resident etc but could involve care discussion time.  Alyson Reedy, M.D. [facility] Pulmonary/Critical Care Medicine. Pager: [phone]. After hours pager: [phone].  12/28/2014, 11:24 AM

## 2014-12-28 NOTE — ED Notes (Signed)
Report given to Alinda Moneyony, EMT-P with [facility] All questions answered by this nurse Patient in NAD [facility]

## 2014-12-29 DIAGNOSIS — G8929 Other chronic pain: Secondary | ICD-10-CM

## 2014-12-29 DIAGNOSIS — F11121 Opioid abuse with intoxication delirium: Secondary | ICD-10-CM

## 2014-12-29 DIAGNOSIS — Z954 Presence of other heart-valve replacement: Secondary | ICD-10-CM

## 2014-12-29 DIAGNOSIS — F11221 Opioid dependence with intoxication delirium: Secondary | ICD-10-CM | POA: Diagnosis present

## 2014-12-29 DIAGNOSIS — M549 Dorsalgia, unspecified: Secondary | ICD-10-CM

## 2014-12-29 DIAGNOSIS — G9340 Encephalopathy, unspecified: Secondary | ICD-10-CM | POA: Diagnosis present

## 2014-12-29 LAB — GLUCOSE, CAPILLARY: Glucose-Capillary: 105 mg/dL — ABNORMAL HIGH (ref 70–99)

## 2014-12-29 MED ORDER — HYDRALAZINE HCL 20 MG/ML IJ SOLN
10.0000 mg | INTRAMUSCULAR | Status: DC
  Administered 2014-12-29 – 2014-12-30 (×4): 10 mg via INTRAVENOUS
  Filled 2014-12-29: qty 1
  Filled 2014-12-29: qty 0.5
  Filled 2014-12-29: qty 1
  Filled 2014-12-29: qty 0.5
  Filled 2014-12-29: qty 1
  Filled 2014-12-29 (×4): qty 0.5

## 2014-12-29 MED ORDER — LABETALOL HCL 5 MG/ML IV SOLN
10.0000 mg | INTRAVENOUS | Status: DC | PRN
  Filled 2014-12-29: qty 4

## 2014-12-29 MED ORDER — HYDRALAZINE HCL 20 MG/ML IJ SOLN: 10.0000 mg | Freq: Four times a day (QID) | INTRAMUSCULAR | Status: DC

## 2014-12-29 MED ORDER — HALOPERIDOL LACTATE 5 MG/ML IJ SOLN: 2.0000 mg | Freq: Four times a day (QID) | INTRAMUSCULAR | Status: DC | PRN

## 2014-12-29 NOTE — Progress Notes (Signed)
[facility] PROGRESS NOTE  Abigail PoeDonna S Reish EAV:[id number] DOB: [birth year] DOA: 12/27/2014 PCP: Sherrie MustacheJADALI,FAYEGH, MD   PCCM TRANSFER 3/28  Assessment/Plan: 1. Encephalopathy/confusion -etiology suspected to be from OD (gabapentin/narcotics) vs hypertensive encephalopathy -improving but still confused, ? Narcotic withdrawal -Utox positives for opiates only -CT unremarkable, no evidence of infectious process -Psych consult, daughter suspects narcotic abuse, may be getting pills from neighbor too -sitter at bedside -ambulate, PT/OT  2. Accelerated HTN -required labetalol gtt -now off and stable -continue lisinopril  3. H/o AVR  4.  Chronic back pain -off narcotics, tylenol PRN  5. Hyperthyroidism -TSH normal, free T4 mildly up  DVt proph: Hep SQ  Code Status: Full Code Family Communication: None at bedside Disposition Plan: tX out of ICU  Consultants:  PSychiatry  HPI/Subjective: remains confused, but less agitated  Objective: Filed Vitals:   12/29/14 1100  BP:   Pulse:   Temp: 98 F (36.7 C)  Resp:     Intake/Output Summary (Last 24 hours) at 12/29/14 1317 Last data filed at 12/29/14 0902  Gross per 24 hour  Intake  697.5 ml  Output      0 ml  Net  697.5 ml   Filed Weights   12/28/14 0100 12/29/14 0425  Weight: 60.6 kg (133 lb 9.6 oz) 64 kg (141 lb 1.5 oz)    Exam:   General:  Confused, answers some questions appropriately  Cardiovascular: S1S2/RRR  Respiratory: CTAB  Abdomen: soft, NT, BS present  Musculoskeletal: no edema c/c  Neuro: non focal   Data Reviewed: Basic Metabolic Panel:  Recent Labs Lab 12/27/14 1557 12/28/14 0240 12/28/14 2114  NA 133* 135 133*  K 4.1 3.9 4.1  CL 102 103 99  CO2 19 24 23   GLUCOSE 87 111* 110*  BUN 12 8 9   CREATININE 0.94 1.05 0.95  CALCIUM 9.1 8.8 9.0  MG  --  1.5 2.8*  PHOS  --  4.0  --    Liver Function Tests:  Recent Labs Lab 12/27/14 1557 12/28/14 0240  AST 33 26  ALT 13 10   ALKPHOS 136* 125*  BILITOT 0.9 0.8  PROT 7.5 6.2  ALBUMIN 3.9 3.1*   No results for input(s): LIPASE, AMYLASE in the last 168 hours. No results for input(s): AMMONIA in the last 168 hours. CBC:  Recent Labs Lab 12/27/14 1557 12/28/14 0240  WBC 8.4 8.2  HGB 14.7 13.0  HCT 43.6 38.9  MCV 87.4 87.0  PLT 332 283   Cardiac Enzymes:  Recent Labs Lab 12/27/14 1557  CKTOTAL 87  CKMB 4.1*   BNP (last 3 results) No results for input(s): BNP in the last 8760 hours.  ProBNP (last 3 results) No results for input(s): PROBNP in the last 8760 hours.  CBG:  Recent Labs Lab 12/27/14 1617 12/28/14 0014 12/28/14 0103  GLUCAP 88 104* 105*    Recent Results (from the past 240 hour(s))  Clostridium Difficile by PCR     Status: None   Collection Time: 12/27/14  5:18 PM  Result Value Ref Range Status   C difficile by pcr NEGATIVE NEGATIVE Final  MRSA PCR Screening     Status: None   Collection Time: 12/28/14  3:30 AM  Result Value Ref Range Status   MRSA by PCR NEGATIVE NEGATIVE Final    Comment:        The GeneXpert MRSA Assay (FDA approved for NASAL specimens only), is one component of a comprehensive MRSA colonization surveillance program. It is not intended  to diagnose MRSA infection nor to guide or monitor treatment for MRSA infections.      Studies: Ct Head Wo Contrast  12/27/2014   CLINICAL DATA:  Altered mental status, overdose  EXAM: CT HEAD WITHOUT CONTRAST  TECHNIQUE: Contiguous axial images were obtained from the base of the skull through the vertex without intravenous contrast.  COMPARISON:  02/22/2014  FINDINGS: No skull fracture is noted. No intracranial hemorrhage, mass effect or midline shift.  Paranasal sinuses are unremarkable. Stable chronic opacification of left mastoid air cells. Atherosclerotic calcifications of carotid siphon again noted.  No definite acute cortical infarction. No mass lesion is noted on this unenhanced scan. Stable mild  periventricular white matter decreased attenuation probable due to chronic small vessel ischemic changes. Stable subtle hypodensity bilateral frontal lobes probable due to prior small infarcts.  IMPRESSION: No acute intracranial abnormality. Mild cerebral atrophy. No definite acute cortical infarction. No mass lesion is noted on this unenhanced scan.   Electronically Signed   By: Natasha Mead M.D.   On: 12/27/2014 19:02   Dg Chest Port 1 View  12/27/2014   CLINICAL DATA:  Altered mental status  EXAM: PORTABLE CHEST - 1 VIEW  COMPARISON:  12/18/2014  FINDINGS: The heart size and mediastinal contours are within normal limits. Both lungs are clear. The visualized skeletal structures are unremarkable. Cardiac leads obscure detail. Evidence of CABG reidentified.  IMPRESSION: No active disease.   Electronically Signed   By: Christiana Pellant M.D.   On: 12/27/2014 16:53    Scheduled Meds: . amLODipine  10 mg Oral Daily  . antiseptic oral rinse  15 mL Mouth Rinse 6 times per day  . famotidine (PEPCID) IV  20 mg Intravenous Q12H  . heparin  5,000 Units Subcutaneous 3 times per day  . ipratropium-albuterol  3 mL Nebulization Q6H  . lisinopril  20 mg Oral Daily   Continuous Infusions: . sodium chloride 10 mL/hr at 12/28/14 0448   Antibiotics Given (last 72 hours)    None      Active Problems:   Back pain   S/P AVR (aortic valve replacement)   Hypertensive emergency   Acute encephalopathy   Chronic back pain    Time spent:    [facility]  [facility] Pager [phone]. If 7PM-7AM, please contact night-coverage at [email or web address], password [facility] 12/29/2014, 1:17 PM  LOS: 2 days

## 2014-12-29 NOTE — Consult Note (Addendum)
[facility] Consult   Reason for Consult:  AMS, depression and overdose Referring Physician:  Dr. 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 Hypertensive emergency [I10] 12/27/2014  . Smoker [Z72.0] 05/09/2014  . S/P AVR (aortic valve replacement) [Z95.4] 02/01/2013  . Hypertension [I10] 01/01/2013  . Itching [L29.9] 05/10/2012  . Dizziness [R42] 04/09/2012  . CAD (coronary artery disease) [I25.10] 04/09/2012  . Tachycardia [R00.0] 01/05/2012  . Aortic valve stenosis [I35.0] 11/18/2011  . Chronic chest wall pain [R07.89, G89.29] 11/18/2011  . Shortness of breath [R06.02] 11/18/2011  . Back pain [M54.9] 11/18/2011    Total Time spent with patient: 1 hour  Subjective:   Abigail SQUITIERI is a 59 y.o. female patient admitted with overdose of medication.  HPI: [name] is a 59 years old female seen and chart reviewed for psychiatric consultation regarding opiate overdose. Patient is awake, alert oriented to her first name last name name of the hospital but not oriented to conditions brought her to the hospital. Patient does not remember being combative on admission. Patient reported she has been taking 1 tablet of pain medication daily and she was taken 3 pills on day of admission. Patient urine drug screen is positive for opiates. Patient does not remember how she was ended up coming to the hospital. Patient was reported chronic pain syndrome and also become combative at the time of admission. Patient denied symptoms of depression, anxiety, mania, psychosis and denied current current suicidal/homicidal ideation, intention or plans. Patient has denied alcoholism and drug of abuse. Patient reported she lives with her son was 46 years old in [place], [place]. Will ask psychiatric social service to  contact her family members regarding collateral information.   Medical history: Patient cannot give history right now, history taken from her daughter bedside. This is a 57F with multiple medical history, who apparently also has chronic pain on her back and leg. Apparently about 2 days ago she started becoming combative and had some headache. No fever, no chills, no chest pain, no vision changes, no cough, no N/V, no abd pain, no diarrhea, no syncope. No seizures. According to her daughter, usually when she acts like this it means she is taking too much of her pain medications and she also sometimes take medications from other people for pain. Her daughter did a pill count and she is missing a lot of gabapentin and some levothyroxine pills. Also maybe a few pills of opiates. Also she has not been taking her HTN meds for at least 4 days. CT head no acute findings. She was very combative in ED needing some antipsychotic and also very hypertensive needing cardene drip. Urine positive only for opiates. Serum TSH normal, FT4 only slightly elevated at 1.9  PAST MEDICAL HISTORY : Patient has a past medical history of Smoking history; Aortic valve stenosis; Back pain, chronic; Chest pain; SOB (shortness of breath); Coronary artery disease; CHF (congestive heart failure); Dyslipidemia; Thyroid disease; Barrett esophagus; GERD (gastroesophageal reflux disease); PUD (peptic ulcer disease); Depression; Deaf; Sinus congestion; Wheezing; Rash, skin; Seizures; Hypothyroidism; Hypertension; Arthritis; and PONV (postoperative nausea and vomiting).  Patient has past surgical history that includes Cholecystectomy; Cesarean section; Vaginal hysterectomy; Tonsillectomy and adenoidectomy; Aortic valve replacement (02/20/2012); and Cardiac catheterization (4/2011l;12/2011).  HPI Elements:   Location:  uniIntentional overdose. Quality:  Fair to poor. Severity:  Denied symptoms of depression and anxiety. Timing:  Chronic pain  with exacerbation.  Past Medical History:  Past Medical History  Diagnosis Date  . Smoking history   . Aortic valve stenosis   . Back pain, chronic   . Chest pain   . SOB (shortness of breath)   . Coronary artery disease     mild; non-hemodynamically significant  . CHF (congestive heart failure)   . Dyslipidemia   . Thyroid disease     hypothyroidism  . Barrett esophagus   . GERD (gastroesophageal reflux disease)     with PUD  . PUD (peptic ulcer disease)   . Depression   . Deaf   . Sinus congestion   . Wheezing   . Rash, skin     2 weeks ago, bilateral hand rash, peeling, eruptions   . Seizures     as a child at age 45  . Hypothyroidism   . Hypertension   . Arthritis     back, hips, legs  . PONV (postoperative nausea and vomiting)     Past Surgical History  Procedure Laterality Date  . Cholecystectomy    . Cesarean section      x 2  . Vaginal hysterectomy    . Tonsillectomy and adenoidectomy      at age 85  . Aortic valve replacement  02/20/2012    Procedure: AORTIC VALVE REPLACEMENT (AVR);  Surgeon: Melrose Nakayama, MD;  Location: [place];  Service: Open Heart Surgery;  Laterality: N/A;  . Cardiac catheterization  4/2011l;12/2011   Family History:  Family History  Problem Relation Age of Onset  . Heart attack Father   . Heart disease Neg Hx     heart valve problems in other family members  . Dementia Mother   . Hypertension Mother    Social History:  History  Alcohol Use No     History  Drug Use No    History   Social History  . Marital Status: Divorced    Spouse Name: N/A  . Number of Children: N/A  . Years of Education: N/A   Occupational History  . disabled     due to her nerves, chronic back pain and migrain headaches   Social History Main Topics  . Smoking status: Current Every Day Smoker -- 0.50 packs/day for 30 years    Types: Cigarettes  . Smokeless tobacco: Not on file  . Alcohol Use: No  . Drug Use: No  . Sexual Activity: Not  on file   Other Topics Concern  . None   Social History Narrative   Lives at home and cares for her mother who has dementia and also cares for her disabled son as well.      Smokes 1 ppd x 30 yrs      Pt is deaf            Additional Social History:                          Allergies:   Allergies  Allergen Reactions  . Chlorine     Bleach, rash hands  . Other     CHG hand soap - rash    Labs:  Results for orders placed or performed during the hospital encounter of 12/27/14 (from the past 48 hour(s))  Ethanol (ETOH)     Status: None   Collection Time: 12/27/14  3:50 PM  Result Value Ref Range  Alcohol, Ethyl (B) <5 0 - 9 mg/dL    Comment:        LOWEST DETECTABLE LIMIT FOR SERUM ALCOHOL IS 11 mg/dL FOR MEDICAL PURPOSES ONLY   Acetaminophen level     Status: Abnormal   Collection Time: 12/27/14  3:50 PM  Result Value Ref Range   Acetaminophen (Tylenol), Serum <10.0 (L) 10 - 30 ug/mL    Comment:        THERAPEUTIC CONCENTRATIONS VARY SIGNIFICANTLY. A RANGE OF 10-30 ug/mL MAY BE AN EFFECTIVE CONCENTRATION FOR MANY PATIENTS. HOWEVER, SOME ARE BEST TREATED AT CONCENTRATIONS OUTSIDE THIS RANGE. ACETAMINOPHEN CONCENTRATIONS >150 ug/mL AT 4 HOURS AFTER INGESTION AND >50 ug/mL AT 12 HOURS AFTER INGESTION ARE OFTEN ASSOCIATED WITH TOXIC REACTIONS.   Salicylate level     Status: None   Collection Time: 12/27/14  3:50 PM  Result Value Ref Range   Salicylate Lvl <0.9 2.8 - 20.0 mg/dL  CBC     Status: None   Collection Time: 12/27/14  3:57 PM  Result Value Ref Range   WBC 8.4 4.0 - 10.5 K/uL   RBC 4.99 3.87 - 5.11 MIL/uL   Hemoglobin 14.7 12.0 - 15.0 g/dL   HCT 43.6 36.0 - 46.0 %   MCV 87.4 78.0 - 100.0 fL   MCH 29.5 26.0 - 34.0 pg   MCHC 33.7 30.0 - 36.0 g/dL   RDW 15.2 11.5 - 15.5 %   Platelets 332 150 - 400 K/uL  Comprehensive metabolic panel     Status: Abnormal   Collection Time: 12/27/14  3:57 PM  Result Value Ref Range   Sodium 133 (L)  135 - 145 mmol/L   Potassium 4.1 3.5 - 5.1 mmol/L   Chloride 102 96 - 112 mmol/L   CO2 19 19 - 32 mmol/L   Glucose, Bld 87 70 - 99 mg/dL   BUN 12 6 - 23 mg/dL   Creatinine, Ser 0.94 0.50 - 1.10 mg/dL   Calcium 9.1 8.4 - 10.5 mg/dL   Total Protein 7.5 6.0 - 8.3 g/dL   Albumin 3.9 3.5 - 5.2 g/dL   AST 33 0 - 37 U/L   ALT 13 0 - 35 U/L   Alkaline Phosphatase 136 (H) 39 - 117 U/L   Total Bilirubin 0.9 0.3 - 1.2 mg/dL   GFR calc non Af Amer 65 (L) >90 mL/min   GFR calc Af Amer 75 (L) >90 mL/min    Comment: (NOTE) The eGFR has been calculated using the CKD EPI equation. This calculation has not been validated in all clinical situations. eGFR's persistently <90 mL/min signify possible Chronic Kidney Disease.    Anion gap 12 5 - 15  TSH     Status: None   Collection Time: 12/27/14  3:57 PM  Result Value Ref Range   TSH 1.487 0.350 - 4.500 uIU/mL  T4, free     Status: Abnormal   Collection Time: 12/27/14  3:57 PM  Result Value Ref Range   Free T4 1.90 (H) 0.80 - 1.80 ng/dL    Comment: Performed at [facility]   CK total and CKMB (cardiac)     Status: Abnormal   Collection Time: 12/27/14  3:57 PM  Result Value Ref Range   Total CK 87 7 - 177 U/L   CK, MB 4.1 (H) 0.3 - 4.0 ng/mL   Relative Index RELATIVE INDEX IS INVALID 0.0 - 2.5    Comment: WHEN CK < 100 U/L  Performed at [facility]   Urine rapid drug screen (hosp performed)     Status: Abnormal   Collection Time: 12/27/14  4:01 PM  Result Value Ref Range   Opiates POSITIVE (A) NONE DETECTED   Cocaine NONE DETECTED NONE DETECTED   Benzodiazepines NONE DETECTED NONE DETECTED   Amphetamines NONE DETECTED NONE DETECTED   Tetrahydrocannabinol NONE DETECTED NONE DETECTED   Barbiturates NONE DETECTED NONE DETECTED    Comment:        DRUG SCREEN FOR MEDICAL PURPOSES ONLY.  IF CONFIRMATION IS NEEDED FOR ANY PURPOSE, NOTIFY LAB WITHIN 5 DAYS.        LOWEST DETECTABLE LIMITS FOR URINE DRUG SCREEN Drug  Class       Cutoff (ng/mL) Amphetamine      1000 Barbiturate      200 Benzodiazepine   222 Tricyclics       979 Opiates          300 Cocaine          300 THC              50   Urinalysis, Routine w reflex microscopic     Status: Abnormal   Collection Time: 12/27/14  4:01 PM  Result Value Ref Range   Color, Urine YELLOW YELLOW   APPearance CLEAR CLEAR   Specific Gravity, Urine 1.019 1.005 - 1.030   pH 6.0 5.0 - 8.0   Glucose, UA NEGATIVE NEGATIVE mg/dL   Hgb urine dipstick TRACE (A) NEGATIVE   Bilirubin Urine MODERATE (A) NEGATIVE   Ketones, ur 15 (A) NEGATIVE mg/dL   Protein, ur 100 (A) NEGATIVE mg/dL   Urobilinogen, UA 1.0 0.0 - 1.0 mg/dL   Nitrite NEGATIVE NEGATIVE   Leukocytes, UA NEGATIVE NEGATIVE  Urine microscopic-add on     Status: None   Collection Time: 12/27/14  4:01 PM  Result Value Ref Range   WBC, UA 0-2 <3 WBC/hpf   RBC / HPF 0-2 <3 RBC/hpf   Urine-Other MUCOUS PRESENT   I-stat troponin, ED     Status: None   Collection Time: 12/27/14  4:11 PM  Result Value Ref Range   Troponin i, poc 0.03 0.00 - 0.08 ng/mL   Comment 3            Comment: Due to the release kinetics of cTnI, a negative result within the first hours of the onset of symptoms does not rule out myocardial infarction with certainty. If myocardial infarction is still suspected, repeat the test at appropriate intervals.   CBG monitoring, ED     Status: None   Collection Time: 12/27/14  4:17 PM  Result Value Ref Range   Glucose-Capillary 88 70 - 99 mg/dL  Clostridium Difficile by PCR     Status: None   Collection Time: 12/27/14  5:18 PM  Result Value Ref Range   C difficile by pcr NEGATIVE NEGATIVE  POC CBG, ED     Status: Abnormal   Collection Time: 12/28/14 12:14 AM  Result Value Ref Range   Glucose-Capillary 104 (H) 70 - 99 mg/dL   Comment 1 Document in Chart    Comment 2 Repeat Test   Glucose, capillary     Status: Abnormal   Collection Time: 12/28/14  1:03 AM  Result Value Ref  Range   Glucose-Capillary 105 (H) 70 - 99 mg/dL   Comment 1 Notify RN   CBC     Status: None   Collection Time: 12/28/14  2:40 AM  Result Value Ref Range   WBC 8.2 4.0 - 10.5 K/uL   RBC 4.47 3.87 - 5.11 MIL/uL   Hemoglobin 13.0 12.0 - 15.0 g/dL   HCT 38.9 36.0 - 46.0 %   MCV 87.0 78.0 - 100.0 fL   MCH 29.1 26.0 - 34.0 pg   MCHC 33.4 30.0 - 36.0 g/dL   RDW 15.3 11.5 - 15.5 %   Platelets 283 150 - 400 K/uL  Magnesium     Status: None   Collection Time: 12/28/14  2:40 AM  Result Value Ref Range   Magnesium 1.5 1.5 - 2.5 mg/dL  Phosphorus     Status: None   Collection Time: 12/28/14  2:40 AM  Result Value Ref Range   Phosphorus 4.0 2.3 - 4.6 mg/dL  Comprehensive metabolic panel     Status: Abnormal   Collection Time: 12/28/14  2:40 AM  Result Value Ref Range   Sodium 135 135 - 145 mmol/L   Potassium 3.9 3.5 - 5.1 mmol/L   Chloride 103 96 - 112 mmol/L   CO2 24 19 - 32 mmol/L   Glucose, Bld 111 (H) 70 - 99 mg/dL   BUN 8 6 - 23 mg/dL   Creatinine, Ser 1.05 0.50 - 1.10 mg/dL   Calcium 8.8 8.4 - 10.5 mg/dL   Total Protein 6.2 6.0 - 8.3 g/dL   Albumin 3.1 (L) 3.5 - 5.2 g/dL   AST 26 0 - 37 U/L   ALT 10 0 - 35 U/L   Alkaline Phosphatase 125 (H) 39 - 117 U/L   Total Bilirubin 0.8 0.3 - 1.2 mg/dL   GFR calc non Af Amer 57 (L) >90 mL/min   GFR calc Af Amer 66 (L) >90 mL/min    Comment: (NOTE) The eGFR has been calculated using the CKD EPI equation. This calculation has not been validated in all clinical situations. eGFR's persistently <90 mL/min signify possible Chronic Kidney Disease.    Anion gap 8 5 - 15  Lactic acid, plasma     Status: None   Collection Time: 12/28/14  2:40 AM  Result Value Ref Range   Lactic Acid, Venous 2.0 0.5 - 2.0 mmol/L  MRSA PCR Screening     Status: None   Collection Time: 12/28/14  3:30 AM  Result Value Ref Range   MRSA by PCR NEGATIVE NEGATIVE    Comment:        The GeneXpert MRSA Assay (FDA approved for NASAL specimens only), is one  component of a comprehensive MRSA colonization surveillance program. It is not intended to diagnose MRSA infection nor to guide or monitor treatment for MRSA infections.   Basic metabolic panel     Status: Abnormal   Collection Time: 12/28/14  9:14 PM  Result Value Ref Range   Sodium 133 (L) 135 - 145 mmol/L   Potassium 4.1 3.5 - 5.1 mmol/L   Chloride 99 96 - 112 mmol/L   CO2 23 19 - 32 mmol/L   Glucose, Bld 110 (H) 70 - 99 mg/dL   BUN 9 6 - 23 mg/dL   Creatinine, Ser 0.95 0.50 - 1.10 mg/dL   Calcium 9.0 8.4 - 10.5 mg/dL   GFR calc non Af Amer 64 (L) >90 mL/min   GFR calc Af Amer 75 (L) >90 mL/min    Comment: (NOTE) The eGFR has been calculated using the CKD EPI equation. This calculation has not been validated in all clinical situations. eGFR's persistently <90 mL/min signify possible Chronic Kidney Disease.  Anion gap 11 5 - 15  Magnesium     Status: Abnormal   Collection Time: 12/28/14  9:14 PM  Result Value Ref Range   Magnesium 2.8 (H) 1.5 - 2.5 mg/dL    Vitals: Blood pressure 147/66, pulse 70, temperature 98.8 F (37.1 C), temperature source Oral, resp. rate 19, height 5' (1.524 m), weight 64 kg (141 lb 1.5 oz), SpO2 98 %.  Risk to Self: Is patient at risk for suicide?: No Risk to Others:   Prior Inpatient Therapy:   Prior Outpatient Therapy:    Current Facility-Administered Medications  Medication Dose Route Frequency Provider Last Rate Last Dose  . 0.9 %  sodium chloride infusion  250 mL Intravenous PRN Alric Quan, MD      . 0.9 %  sodium chloride infusion   Intravenous Continuous Raylene Miyamoto, MD 10 mL/hr at 12/28/14 0448    . acetaminophen (TYLENOL) tablet 650 mg  650 mg Oral Q4H PRN Alric Quan, MD      . amLODipine (NORVASC) tablet 10 mg  10 mg Oral Daily Alric Quan, MD   Stopped at 12/27/14 2301  . antiseptic oral rinse (BIOTENE) solution 15 mL  15 mL Mouth Rinse 6 times per day Raylene Miyamoto, MD   15 mL at 12/28/14 0804   . dextrose 5 %-0.9 % sodium chloride infusion   Intravenous Continuous Anders Simmonds, MD 50 mL/hr at 12/28/14 2257    . famotidine (PEPCID) IVPB 20 mg  20 mg Intravenous Q12H Alric Quan, MD   20 mg at 12/28/14 2210  . heparin injection 5,000 Units  5,000 Units Subcutaneous 3 times per day Alric Quan, MD   5,000 Units at 12/29/14 [phone]  . ipratropium-albuterol (DUONEB) 0.5-2.5 (3) MG/3ML nebulizer solution 3 mL  3 mL Nebulization Q6H Donita Brooks, NP   3 mL at 12/29/14 0815  . labetalol (NORMODYNE,TRANDATE) injection 5 mg  5 mg Intravenous Q10 min PRN Donita Brooks, NP      . lisinopril (PRINIVIL,ZESTRIL) tablet 20 mg  20 mg Oral Daily Alric Quan, MD   Stopped at 12/27/14 2301  . ondansetron (ZOFRAN) injection 4 mg  4 mg Intravenous Q6H PRN Alric Quan, MD        Musculoskeletal: Strength & Muscle Tone: decreased Gait & Station: unable to stand Patient leans: N/A  Psychiatric Specialty Exam: Physical Exam as per history and physical   ROS chronic pain, somewhat confused and combative on admission   Blood pressure 147/66, pulse 70, temperature 98.8 F (37.1 C), temperature source Oral, resp. rate 19, height 5' (1.524 m), weight 64 kg (141 lb 1.5 oz), SpO2 98 %.Body mass index is 27.56 kg/(m^2).  General Appearance: Guarded  Eye Contact::  Good  Speech:  Blocked and Clear and Coherent  Volume:  Decreased  Mood:  Anxious  Affect:  Appropriate and Congruent  Thought Process:  Coherent and Goal Directed  Orientation:  Full (Time, Place, and Person)  Thought Content:  WDL  Suicidal Thoughts:  No  Homicidal Thoughts:  No  Memory:  Immediate;   Fair Recent;   Poor  Judgement:  Impaired  Insight:  Shallow  Psychomotor Activity:  Decreased  Concentration:  Fair  Recall:  Poor  Fund of Knowledge:Fair  Language: Good  Akathisia:  Negative  Handed:  Right  AIMS (if indicated):     Assets:  Communication Skills Desire for Improvement Financial  Resources/Insurance Housing Intimacy Leisure Time Resilience Social Support  ADL's:  Impaired  Cognition: Impaired,  Mild  Sleep:      Medical Decision Making: New problem, with additional work up planned, Review of Psycho-Social Stressors (1), Review or order clinical lab tests (1), Review of Last Therapy Session (1), Review or order medicine tests (1), Review of Medication Regimen & Side Effects (2) and Review of New Medication or Change in Dosage (2)  Treatment Plan Summary: Daily contact with patient to assess and evaluate symptoms and progress in treatment and Medication management  Plan: Monitor closely for opioid withdrawal symptoms No psychiatric medication recommended at this time Patient does not meet criteria for psychiatric inpatient admission. Supportive therapy provided about ongoing stressors. Appreciate psychiatric consultation and follow up as clinically required Please contact [phone] or [phone] if needs further assistance  Disposition: Patient does not required psychiatric hospitalization may be discharged home with outpatient medication management and medically stable.  Akeelah Seppala,JANARDHAHA R. 12/29/2014 10:00 AM

## 2014-12-29 NOTE — Clinical Social Work Psych Assess (Signed)
Clinical Social Work Department CLINICAL SOCIAL WORK PSYCHIATRY SERVICE LINE ASSESSMENT 12/29/2014  Patient:  [name]  Account:  [id number]  Admit Date:  12/27/2014  Clinical Social Worker:  Read DriversEGINA Mareli Antunes, LCSWA  Date/Time:  12/29/2014 12:15 PM Referred by:  Physician  Date referred:  12/29/2014 Reason for Referral  Crisis Intervention  Substance Abuse  Psychosocial assessment   Presenting Symptoms/Problems (In the person's/family's own words):   [facility] after being alert though unresponsive to questions asked.   Abuse/Neglect/Trauma History (check all that apply)  Denies history   Abuse/Neglect/Trauma Comments:   none known or reported by son/daughter   Psychiatric History (check all that apply)  Denies history   Psychiatric medications:  . amLODipine  10 mg Oral Daily   Current Mental Health Hospitalizations/Previous Mental Health History:   none reported - denied   Current provider[place]   [place] [place] [place]   Place and Date:   ongoing   Current Medications:   Scheduled Meds:      . amLODipine  10 mg Oral Daily  . antiseptic oral rinse  15 mL Mouth Rinse 6 times per day  . famotidine (PEPCID) IV  20 mg Intravenous Q12H  . heparin  5,000 Units Subcutaneous 3 times per day  . ipratropium-albuterol  3 mL Nebulization Q6H  . lisinopril  20 mg Oral Daily        Continuous Infusions:      . sodium chloride 10 mL/hr at 12/28/14 0448    . dextrose 5 % and 0.9% NaCl 50 mL/hr at 12/28/14 2257          PRN Meds:.sodium chloride, acetaminophen, labetalol, ondansetron (ZOFRAN) IV       Previous Impatient Admission/Date/Reason:   3-4 years ago patient was admitted to the hospital after taking an undisclosed amount of pain medication- not intentional overdose, only trying to get rid of pain   Emotional Health / Current Symptoms    Suicide/Self Harm  None reported   Suicide attempt in the past:   none reported or noted in the  chart   Other harmful behavior:   patient is non-compliant with the dispensing of her medications.  Patient has hx of ingesting more than the prescribed amounts of medications.   Psychotic/Dissociative Symptoms  Unable to accurately assess   Other Psychotic/Dissociative Symptoms:   none (patient currently AOx1)    Attention/Behavioral Symptoms  Unable to accurately assess   Other Attention / Behavioral Symptoms:   none (patient currently AOx1)    Cognitive Impairment  Orientation - Self  Recent Memory Impairment  Poor/Impaired Decision-Making   Other Cognitive Impairment:   patient is only oriented to person at this time    Mood and Adjustment  Unable to accurately assess    Stress, Anxiety, Trauma, Any Recent Loss/Stressor  None reported   Anxiety (frequency):   unable to assess at this time-patient alert though only oriented to person   Phobia (specify):   unable to assess at this time-patient alert though only oriented to person   Compulsive behavior (specify):   unable to assess at this time-patient alert though only oriented to person   Obsessive behavior (specify):   unable to assess at this time-patient alert though only oriented to person   Other:   unable to assess at this time-patient alert though only oriented to person   Substance Abuse/Use  None   SBIRT completed (please refer for detailed history):  N  Self-reported substance use:   UDS postive for opiates- prescribed- last consumed- on admission   Urinary Drug Screen Completed:  Y Alcohol level:   BAL- WNL    Environmental/Housing/Living Arrangement  With Family Member   Who is in the home:   son, Ebony Cargo   Emergency contact:  Ebony Cargo [phone] son  Efraim Kaufmann [phone] daughter   Financial  Medicaid  Social Security Disability Income   Patient's Strengths and Goals (patient's own words):   Patient has supportive children, receives stable income and has insurance/access to  medical care as well as transportation   Clinical Social Worker's Interpretive Summary:   Psychiatry was consulted status post over dose on optiates. Patient was found alert and able to ambulate; however unable to appropriately respond to asked questions.  Son, Ebony Cargo reports living with patient and was the one who assisted her to the ED.  Ebony Cargo reports patient having "blank stare" and being "zombie-like".  Patient son checked on patient's prescription and noticed that more pills were missing than were supposed to be though unaware of exact number.    Son reports patient has been hospitalized once before for attempted overdose though not deemed intentional or suicidal.  Son feels that patient is not suicidal and feels that patient is only wishing to rid herself of chronic pain.  Son reports patient having chronic pain in her lower back and legs due to debilitating arthritis.  Patient has PCP- MD Earlene Plater located in [place] and per report from [place], is compliant with follow-up.    Ebony Cargo is unaware of any mental health diagnosis that patient may have.  Son denies patient having any mental health history: neither inpatient nor outpatient.    Ebony Cargo states that patient does not use street drugs or participate in ETOH consumption (not even socially).    Patient has a daugher, Efraim Kaufmann, that is also active in patient's care.  Psych CSW has left message with Melissa to gather collateral information.  Psych CSW is currently awaiting a return call.    Psych CSW updated psychiatrist with collateral information and will continue to follow and assist as necessary.   Disposition:  Outpatient referral made/needed

## 2014-12-29 NOTE — Clinical Social Work Psych Note (Addendum)
12:41pm- Psych CSW received a call from patient's son. Please see full assessment for details.  12:01pm- Patient responds only to voice and is only oriented to person.  Psych CSW received referral for possible intentional overdose- suicide attempt.  Psych CSW left message with patient's daughter Efraim KaufmannMelissa [phone] and contacted patient's son, Ebony CargoClayton [phone]- psych CSW was unable to leave message (no voicemail had been set up).  Psych CSW awaiting a return call from daughter.  Psych CSW will continue to assist.  Vickii PennaGina Iram Astorino, LCSWA [phone]  Psychiatric & Orthopedics (5N 1-8) Clinical Social Worker

## 2014-12-29 NOTE — Progress Notes (Signed)
Transferred Pt to 2C11. Gave report to Swazil[place] RN.

## 2014-12-30 DIAGNOSIS — F11921 Opioid use, unspecified with intoxication delirium: Secondary | ICD-10-CM

## 2014-12-30 MED ORDER — HALOPERIDOL LACTATE 5 MG/ML IJ SOLN
1.0000 mg | Freq: Four times a day (QID) | INTRAMUSCULAR | Status: DC | PRN
  Administered 2014-12-30 – 2015-01-01 (×4): 1 mg via INTRAVENOUS
  Filled 2014-12-30 (×5): qty 1

## 2014-12-30 MED ORDER — HYDRALAZINE HCL 25 MG PO TABS
25.0000 mg | ORAL_TABLET | Freq: Three times a day (TID) | ORAL | Status: DC
  Administered 2014-12-30 – 2015-01-04 (×12): 25 mg via ORAL
  Filled 2014-12-30 (×19): qty 1

## 2014-12-30 MED ORDER — IPRATROPIUM-ALBUTEROL 0.5-2.5 (3) MG/3ML IN SOLN
3.0000 mL | Freq: Two times a day (BID) | RESPIRATORY_TRACT | Status: DC
  Administered 2015-01-01 – 2015-01-03 (×5): 3 mL via RESPIRATORY_TRACT
  Filled 2014-12-30 (×8): qty 3

## 2014-12-30 MED ORDER — FAMOTIDINE 20 MG PO TABS
20.0000 mg | ORAL_TABLET | Freq: Every day | ORAL | Status: DC
  Administered 2014-12-30 – 2015-01-04 (×5): 20 mg via ORAL
  Filled 2014-12-30 (×6): qty 1

## 2014-12-30 NOTE — Progress Notes (Signed)
OT Cancellation Note  Patient Details Name: [name] MRN: [id number] DOB: [birth year]   Cancelled Treatment:    Reason Eval/Treat Not Completed: Patient declined, no reason specified  Earlie RavelingStraub, Hasini Peachey L OTR/L [phone] 12/30/2014, 5:26 PM

## 2014-12-30 NOTE — Progress Notes (Signed)
[facility] PROGRESS NOTE  [name] ZOX:[id number] DOB: 12/01/1955 DOA: 12/27/2014 PCP: Sherrie Mustache, MD   PCCM TRANSFER 3/28 58/F with HTN, chronic back pain, s/p AVR, hypothyroidism was admitted with suspected OD, confusion to ICU under PCCM, BP in 220s on admission, required labetalol gtt too. Required haldol and restraints due to agitation, improving Now Tx to SDU, seen by Psych as well  Assessment/Plan: 1. Encephalopathy/confusion -etiology suspected to be from OD (gabapentin/narcotics) vs hypertensive encephalopathy -improving and close to baseline  -Utox positives for opiates only -CT unremarkable, no evidence of infectious process -Psych consulted, daughter suspects narcotic abuse, may be getting pills from neighbor too -sitter at bedside -ambulate, PT/OT -DC restraints, Tx to floor if stable  2. Accelerated HTN -required labetalol gtt, now off -now improving, continue lisinopril, change hydralazine to Po and amlodipine  3. H/o AVR  4.  Chronic back pain -off narcotics, tylenol PRN  5. Hyperthyroidism -TSH normal, free T4 mildly up  DVt proph: Hep SQ  Code Status: Full Code Family Communication: son at bedside, called d/e daughter [name] yetserday Disposition Plan: tX out of ICU  Consultants:  PSychiatry  HPI/Subjective: More lucid today, uneventful night  Objective: Filed Vitals:   12/30/14 0748  BP: 138/68  Pulse: 105  Temp: 97.4 F (36.3 C)  Resp: 17    Intake/Output Summary (Last 24 hours) at 12/30/14 1221 Last data filed at 12/30/14 1200  Gross per 24 hour  Intake    662 ml  Output      0 ml  Net    662 ml   Filed Weights   12/28/14 0100 12/29/14 0425 12/30/14 0500  Weight: 60.6 kg (133 lb 9.6 oz) 64 kg (141 lb 1.5 oz) 63.5 kg (139 lb 15.9 oz)    Exam:   General:  Alert, awake, oriented to self and place only, answers most questions appropriately  Cardiovascular: S1S2/RRR  Respiratory: CTAB  Abdomen: soft, NT, BS  present  Musculoskeletal: no edema c/c  Neuro: non focal   Data Reviewed: Basic Metabolic Panel:  Recent Labs Lab 12/27/14 1557 12/28/14 0240 12/28/14 2114  NA 133* 135 133*  K 4.1 3.9 4.1  CL 102 103 99  CO2 GLUCOSE 87 111* 110*  BUN CREATININE 0.94 1.05 0.95  CALCIUM 9.1 8.8 9.0  MG  --  1.5 2.8*  PHOS  --  4.0  --    Liver Function Tests:  Recent Labs Lab 12/27/14 1557 12/28/14 0240  AST 33 26  ALT 13 10  ALKPHOS 136* 125*  BILITOT 0.9 0.8  PROT 7.5 6.2  ALBUMIN 3.9 3.1*   No results for input(s): LIPASE, AMYLASE in the last 168 hours. No results for input(s): AMMONIA in the last 168 hours. CBC:  Recent Labs Lab 12/27/14 1557 12/28/14 0240  WBC 8.4 8.2  HGB 14.7 13.0  HCT 43.6 38.9  MCV 87.4 87.0  PLT 332 283   Cardiac Enzymes:  Recent Labs Lab 12/27/14 1557  CKTOTAL 87  CKMB 4.1*   BNP (last 3 results) No results for input(s): BNP in the last 8760 hours.  ProBNP (last 3 results) No results for input(s): PROBNP in the last 8760 hours.  CBG:  Recent Labs Lab 12/27/14 1617 12/28/14 0014 12/28/14 0103  GLUCAP 88 104* 105*    Recent Results (from the past 240 hour(s))  Clostridium Difficile by PCR     Status: None   Collection Time: 12/27/14  5:18 PM  Result Value Ref Range Status   C difficile by pcr NEGATIVE NEGATIVE Final  MRSA PCR Screening     Status: None   Collection Time: 12/28/14  3:30 AM  Result Value Ref Range Status   MRSA by PCR NEGATIVE NEGATIVE Final    Comment:        The GeneXpert MRSA Assay (FDA approved for NASAL specimens only), is one component of a comprehensive MRSA colonization surveillance program. It is not intended to diagnose MRSA infection nor to guide or monitor treatment for MRSA infections.      Studies: No results found.  Scheduled Meds: . amLODipine  10 mg Oral Daily  . antiseptic oral rinse  15 mL Mouth Rinse 6 times per day  . famotidine  20 mg Oral Daily  .  heparin  5,000 Units Subcutaneous 3 times per day  . hydrALAZINE  10 mg Intravenous Q4H  . ipratropium-albuterol  3 mL Nebulization BID  . lisinopril  20 mg Oral Daily   Continuous Infusions: . 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 If 7PM-7AM, please contact night-coverage at [email or web address], password [email or web address] 12/30/2014, 12:21 PM  LOS: 3 days

## 2014-12-30 NOTE — Consult Note (Signed)
Psychiatry Consult follow-up  Reason for Consult:  AMS, depression and overdose Referring Physician:  Dr. 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 Acute encephalopathy [G93.40] 12/29/2014  . Chronic back pain [M54.9, G89.29] 12/29/2014  . 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 Hypertensive emergency [I10] 12/27/2014  . Smoker [Z72.0] 05/09/2014  . S/P AVR (aortic valve replacement) [Z95.4] 02/01/2013  . Hypertension [I10] 01/01/2013  . Itching [L29.9] 05/10/2012  . Dizziness [R42] 04/09/2012  . CAD (coronary artery disease) [I25.10] 04/09/2012  . Tachycardia [R00.0] 01/05/2012  . Aortic valve stenosis [I35.0] 11/18/2011  . Chronic chest wall pain [R07.89, G89.29] 11/18/2011  . Shortness of breath [R06.02] 11/18/2011  . Back pain [M54.9] 11/18/2011    Total Time spent with patient: 30 minutes  Subjective:   [name] is a 59 y.o. female patient admitted with overdose of medication.  HPI: [name] is a 59 years old female seen and chart reviewed for psychiatric consultation regarding opiate overdose. Patient is awake, alert oriented to her first name last name name of the hospital but not oriented to conditions brought her to the hospital. Patient does not remember being combative on admission. Patient reported she has been taking 1 tablet of pain medication daily and she was taken 3 pills on day of admission. Patient urine drug screen is positive for opiates. Patient does not remember how she was ended up coming to the hospital. Patient was reported chronic pain syndrome and also become combative at the time of admission. Patient denied symptoms of depression, anxiety, mania, psychosis and denied current current suicidal/homicidal ideation, intention  or plans. Patient has denied alcoholism and drug of abuse. Patient reported she lives with her son was 63 years old in [place], [place]. Will ask psychiatric social service to contact her family members regarding collateral information.   Interval history: Patient seen today and her family was at bedside. Patient daughter who is CNA and her granddaughter's were at bedside. Patient reportedly living with her son. Patient reportedly suffering with chronic pain syndromes and opioid abuse. Reportedly she has been going around neighborhood last him more pain medication. Patient was referred to the pain medication management where she is getting her pain medication at this time. Patient has been noncompliant with her medication for blood pressure and continued to report frequent headaches and takes more opiates. Reportedly patient was taken overdose on opiates and also gabapentin seems to be large amount. Patient continued to be confused and frequently asking how did I get here and where am I ?. Patient has said I do not know for most of the questions today. Patient has no significant opioid withdrawal symptoms at this time. Patient daughter stated she is looking for apartment in different location for her and also locked medication container which will dispense as scheduled by the pain medication clinic when return home. Reportedly Patient Has Previous History of opioid overdose.    Medical history: Patient cannot give history right now, history taken from her daughter bedside. This is a 66F with multiple medical history, who apparently also has chronic pain on her back and leg. Apparently about 2 days ago she started becoming combative and had some headache. No fever, no chills, no chest pain, no vision changes, no cough, no N/V, no abd pain, no diarrhea, no syncope.  No seizures. According to her daughter, usually when she acts like this it means she is taking too much of her pain medications and she also  sometimes take medications from other people for pain. Her daughter did a pill count and she is missing a lot of gabapentin and some levothyroxine pills. Also maybe a few pills of opiates. Also she has not been taking her HTN meds for at least 4 days. CT head no acute findings. She was very combative in ED needing some antipsychotic and also very hypertensive needing cardene drip. Urine positive only for opiates. Serum TSH normal, FT4 only slightly elevated at 1.9  PAST MEDICAL HISTORY : Patient has a past medical history of Smoking history; Aortic valve stenosis; Back pain, chronic; Chest pain; SOB (shortness of breath); Coronary artery disease; CHF (congestive heart failure); Dyslipidemia; Thyroid disease; Barrett esophagus; GERD (gastroesophageal reflux disease); PUD (peptic ulcer disease); Depression; Deaf; Sinus congestion; Wheezing; Rash, skin; Seizures; Hypothyroidism; Hypertension; Arthritis; and PONV (postoperative nausea and vomiting). Patient has past surgical history that includes Cholecystectomy; Cesarean section; Vaginal hysterectomy; Tonsillectomy and adenoidectomy; Aortic valve replacement (02/20/2012); and Cardiac catheterization (4/2011l;12/2011).   Past Medical History:  Past Medical History  Diagnosis Date  . Smoking history   . Aortic valve stenosis   . Back pain, chronic   . Chest pain   . SOB (shortness of breath)   . Coronary artery disease     mild; non-hemodynamically significant  . CHF (congestive heart failure)   . Dyslipidemia   . Thyroid disease     hypothyroidism  . Barrett esophagus   . GERD (gastroesophageal reflux disease)     with PUD  . PUD (peptic ulcer disease)   . Depression   . Deaf   . Sinus congestion   . Wheezing   . Rash, skin     2 weeks ago, bilateral hand rash, peeling, eruptions   . Seizures     as a child at age 24  . Hypothyroidism   . Hypertension   . Arthritis     back, hips, legs  . PONV (postoperative nausea and vomiting)      Past Surgical History  Procedure Laterality Date  . Cholecystectomy    . Cesarean section      x 2  . Vaginal hysterectomy    . Tonsillectomy and adenoidectomy      at age 63  . Aortic valve replacement  02/20/2012    Procedure: AORTIC VALVE REPLACEMENT (AVR);  Surgeon: Melrose Nakayama, MD;  Location: [facility];  Service: Open Heart Surgery;  Laterality: N/A;  . Cardiac catheterization  4/2011l;12/2011   Family History:  Family History  Problem Relation Age of Onset  . Heart attack Father   . Heart disease Neg Hx     heart valve problems in other family members  . Dementia Mother   . Hypertension Mother    Social History:  History  Alcohol Use No     History  Drug Use No    History   Social History  . Marital Status: Divorced    Spouse Name: N/A  . Number of Children: N/A  . Years of Education: N/A   Occupational History  . disabled     due to her nerves, chronic back pain and migrain headaches   Social History Main Topics  . Smoking status: Current Every Day Smoker -- 0.50 packs/day for 30 years    Types: Cigarettes  . Smokeless tobacco: Not on file  .  Alcohol Use: No  . Drug Use: No  . Sexual Activity: Not on file   Other Topics Concern  . None   Social History Narrative   Lives at home and cares for her mother who has dementia and also cares for her disabled son as well.      Smokes 1 ppd x 30 yrs      Pt is deaf            Additional Social History:                          Allergies:   Allergies  Allergen Reactions  . Chlorine     Bleach, rash hands  . Other     CHG hand soap - rash    Labs:  Results for orders placed or performed during the hospital encounter of 12/27/14 (from the past 48 hour(s))  Basic metabolic panel     Status: Abnormal   Collection Time: 12/28/14  9:14 PM  Result Value Ref Range   Sodium 133 (L) 135 - 145 mmol/L   Potassium 4.1 3.5 - 5.1 mmol/L   Chloride 99 96 - 112 mmol/L   CO2 23 19 - 32 mmol/L    Glucose, Bld 110 (H) 70 - 99 mg/dL   BUN 9 6 - 23 mg/dL   Creatinine, Ser 0.95 0.50 - 1.10 mg/dL   Calcium 9.0 8.4 - 10.5 mg/dL   GFR calc non Af Amer 64 (L) >90 mL/min   GFR calc Af Amer 75 (L) >90 mL/min    Comment: (NOTE) The eGFR has been calculated using the CKD EPI equation. This calculation has not been validated in all clinical situations. eGFR's persistently <90 mL/min signify possible Chronic Kidney Disease.    Anion gap 11 5 - 15  Magnesium     Status: Abnormal   Collection Time: 12/28/14  9:14 PM  Result Value Ref Range   Magnesium 2.8 (H) 1.5 - 2.5 mg/dL    Vitals: Blood pressure 138/68, pulse 105, temperature 97.4 F (36.3 C), temperature source Axillary, resp. rate 17, height 5' (1.524 m), weight 63.5 kg (139 lb 15.9 oz), SpO2 97 %.  Risk to Self: Is patient at risk for suicide?: No Risk to Others:   Prior Inpatient Therapy:   Prior Outpatient Therapy:    Current Facility-Administered Medications  Medication Dose Route Frequency Provider Last Rate Last Dose  . 0.9 %  sodium chloride infusion   Intravenous Continuous Raylene Miyamoto, MD 10 mL/hr at 12/28/14 0448    . acetaminophen (TYLENOL) tablet 650 mg  650 mg Oral Q4H PRN Alric Quan, MD      . amLODipine (NORVASC) tablet 10 mg  10 mg Oral Daily Alric Quan, MD   Stopped at 12/27/14 2301  . antiseptic oral rinse (BIOTENE) solution 15 mL  15 mL Mouth Rinse 6 times per day Raylene Miyamoto, MD   15 mL at 12/30/14 0400  . famotidine (PEPCID) IVPB 20 mg  20 mg Intravenous Q12H Alric Quan, MD   20 mg at 12/29/14 2152  . haloperidol lactate (HALDOL) injection 2 mg  2 mg Intravenous Q6H PRN Domenic Polite, MD      . heparin injection 5,000 Units  5,000 Units Subcutaneous 3 times per day Alric Quan, MD   5,000 Units at 12/30/14 0600  . hydrALAZINE (APRESOLINE) injection 10 mg  10 mg Intravenous Q4H Domenic Polite, MD   10 mg at 12/30/14  1045  . ipratropium-albuterol (DUONEB) 0.5-2.5 (3)  MG/3ML nebulizer solution 3 mL  3 mL Nebulization BID Domenic Polite, MD      . labetalol (NORMODYNE,TRANDATE) injection 10 mg  10 mg Intravenous Q2H PRN Domenic Polite, MD      . lisinopril (PRINIVIL,ZESTRIL) tablet 20 mg  20 mg Oral Daily Alric Quan, MD   Stopped at 12/27/14 2301  . ondansetron (ZOFRAN) injection 4 mg  4 mg Intravenous Q6H PRN Alric Quan, MD        Musculoskeletal: Strength & Muscle Tone: decreased Gait & Station: unable to stand Patient leans: N/A  Psychiatric Specialty Exam: Physical Exam as per history and physical   ROS chronic pain, somewhat confused and combative on admission   Blood pressure 138/68, pulse 105, temperature 97.4 F (36.3 C), temperature source Axillary, resp. rate 17, height 5' (1.524 m), weight 63.5 kg (139 lb 15.9 oz), SpO2 97 %.Body mass index is 27.34 kg/(m^2).  General Appearance: Guarded  Eye Contact::  Good  Speech:  Blocked and Clear and Coherent  Volume:  Decreased  Mood:  Anxious  Affect:  Appropriate and Congruent  Thought Process:  Coherent and Goal Directed  Orientation:  Full (Time, Place, and Person)  Thought Content:  WDL  Suicidal Thoughts:  No  Homicidal Thoughts:  No  Memory:  Immediate;   Fair Recent;   Poor  Judgement:  Impaired  Insight:  Shallow  Psychomotor Activity:  Decreased  Concentration:  Fair  Recall:  Poor  Fund of Knowledge:Fair  Language: Good  Akathisia:  Negative  Handed:  Right  AIMS (if indicated):     Assets:  Communication Skills Desire for Improvement Financial Resources/Insurance Housing Intimacy Leisure Time Resilience Social Support  ADL's:  Impaired  Cognition: Impaired,  Mild  Sleep:      Medical Decision Making: New problem, with additional work up planned, Review of Psycho-Social Stressors (1), Review or order clinical lab tests (1), Review of Last Therapy Session (1), Review or order medicine tests (1), Review of Medication Regimen & Side Effects (2) and Review of  New Medication or Change in Dosage (2)  Treatment Plan Summary: Daily contact with patient to assess and evaluate symptoms and progress in treatment and Medication management  Plan: Monitor closely for opioid withdrawal symptoms No psychiatric medication recommended at this time Patient does not meet criteria for psychiatric inpatient admission. Supportive therapy provided about ongoing stressors. Appreciate psychiatric consultation and follow up as clinically required Please contact [phone] or [phone] if needs further assistance  Disposition: Patient does not required psychiatric hospitalization may be discharged home with outpatient medication management  at pain clinic and appropriate pain medication dispensing by other family members her Locked medication container.  Lindzee Gouge,JANARDHAHA R. 12/30/2014 12:12 PM

## 2014-12-30 NOTE — Clinical Social Work Note (Signed)
CSW received consult that patients family was interested in filling out advance directives once pt is A&O- CSW provide family with paperwork and explained what the advanced directive would do as far as HCPOA- pt daughter, Efraim KaufmannMelissa, expressed understanding.  Patient dtr also stated that she was concerned about the patients pain medication levels- states that the [place] and that whenever the pt complains of additional leg pain the clinic just gives her more medication.    Pt dtr is interested in: reducing pt pain medications or eliminating it or getting some kind of medication lock box in order to regulated pain medication available to pt. - CSW spoke to pharmacy who had no knowledge of such a lock box, spoke with RNCM who also had not heard of a lock box through home health services... Pharmacy mentioned that pt might have a pain contract with the pain clinic in [place] that would specify consequences for the pt misusing her pain medications.  CSW will continue to follow.  Merlyn LotJenna Holoman, LCSWA Clinical Social Worker [phone]

## 2014-12-30 NOTE — Evaluation (Signed)
Physical Therapy Evaluation Patient Details Name: [name] MRN: [id number] DOB: Sep 20, 1956 Today's Date: 12/30/2014   History of Present Illness  patient is a 59 yo female who presents with Encephalopathy/confusion in setting of possible accidental opiate overdose, vs hypertensive encephalopathy.  Clinical Impression  Patient demonstrates deficits in functional mobility as indicated below. Will need continued skilled PT to address deficits and maximize function. Will see as indicated and progress as tolerated. OF NOTE: Patient with elevated BP at rest 178/85 HR 127.  Family present at bedside concerned re: patient with recent complains of significant and severe headaches. Daughter states that she began to have these headaches recently and fears that patient tried to take the pain medicine to aid in relief of headache and when headache didn't resolve she just kept taking more and more, then not realizing how much pain medicine she had been taking, leading to this overdose event.  Daughter concerned regarding cognitive change leading to poor decision making, questioning possible stroke as maternal grandmother had several strokes which only presented with cognitive deficits and no apparent other deficits. Daughter concerned that this may be the case.  (Noted to nursing, CT imaging reference regarding possible old frontal infarcts, but did not discuss with patient or family). Nsg to follow up with MD regarding family concerns.      Follow Up Recommendations Home health PT;Supervision/Assistance - 24 hour;Supervision for mobility/OOB    Equipment Recommendations  Other (comment) (tbd)    Recommendations for Other Services       Precautions / Restrictions Precautions Precautions: Fall Precaution Comments: AMS      Mobility  Bed Mobility               General bed mobility comments: received in chair  Transfers Overall transfer level: Needs assistance Equipment used: 1 person  hand held assist Transfers: Sit to/from Stand Sit to Stand: Min guard;Min assist         General transfer comment: min assist for stability upon standing  Ambulation/Gait Ambulation/Gait assistance: Min assist Ambulation Distance (Feet): 60 Feet Assistive device: 1 person hand held assist Gait Pattern/deviations: Step-through pattern;Decreased stride length;Ataxic;Staggering left;Staggering right;Narrow base of support Gait velocity: decreased Gait velocity interpretation: Below normal speed for age/gender General Gait Details: instability noted with gait, poor ability to self correct, assist required to prevent LOB  Stairs            Wheelchair Mobility    Modified Rankin (Stroke Patients Only)       Balance Overall balance assessment: Needs assistance Sitting-balance support: Feet supported Sitting balance-Leahy Scale: Fair     Standing balance support: No upper extremity supported;During functional activity Standing balance-Leahy Scale: Poor Standing balance comment: increased sway, poor ability to maintain static standing without corrective steps                             Pertinent Vitals/Pain Pain Assessment: Faces Faces Pain Scale: Hurts even more Pain Location: frontal head Pain Descriptors / Indicators: Constant;Headache Pain Intervention(s): Limited activity within patient's tolerance;Monitored during session;Repositioned    Home Living Family/patient expects to be discharged to:: Private residence Living Arrangements: Children Available Help at Discharge: Family;Available 24 hours/day Type of Home: Mobile home Home Access: Stairs to enter Entrance Stairs-Rails: Can reach both Entrance Stairs-Number of Steps: 4     Additional Comments: pt has tub/shower and regular height toilet    Prior Function Level of Independence: Independent  Hand Dominance        Extremity/Trunk Assessment                Lower Extremity Assessment: Generalized weakness         Communication      Cognition Arousal/Alertness: Awake/alert Behavior During Therapy: Anxious Overall Cognitive Status: Impaired/Different from baseline Area of Impairment: Orientation;Attention;Memory;Following commands;Safety/judgement;Awareness;Problem solving Orientation Level: Disoriented to;Place;Time;Situation Current Attention Level: Focused Memory: Decreased short-term memory Following Commands: Follows one step commands with increased time Safety/Judgement: Decreased awareness of safety;Decreased awareness of deficits   Problem Solving: Slow processing;Decreased initiation;Difficulty sequencing;Requires verbal cues;Requires tactile cues      General Comments General comments (skin integrity, edema, etc.): educated family at length regarding disengaging in escalated agitation, educated on quiet calm environment with minimal stimuli. Daughter expressed concerns regarding possibility of stroke causing initial confusion leading mother to OD. States that she has been complaining of severe HAs and self medicating and most likely did not realize how much she had taken.    Exercises        Assessment/Plan    PT Assessment Patient needs continued PT services  PT Diagnosis Difficulty walking;Abnormality of gait;Altered mental status   PT Problem List Decreased strength;Decreased activity tolerance;Decreased balance;Decreased mobility;Decreased coordination;Decreased cognition;Decreased safety awareness;Pain  PT Treatment Interventions DME instruction;Gait training;Stair training;Functional mobility training;Therapeutic activities;Therapeutic exercise;Balance training;Patient/family education   PT Goals (Current goals can be found in the Care Plan section) Acute Rehab PT Goals Patient Stated Goal: none stated PT Goal Formulation: Patient unable to participate in goal setting Time For Goal Achievement:  01/13/15 Potential to Achieve Goals: Good    Frequency Min 3X/week   Barriers to discharge Decreased caregiver support (son lives with mother 24/7, OF NOTE: Son himself expressed that he forgot to take his own medications today, also through observation of interaction, question son's receptiveness to family education regarding decreased antagonistic conversations while pt presenting with agitation, re-educated family on importance of not engaging in agitation of confrontation with patient while presenting with AMS)      Co-evaluation               End of Session Equipment Utilized During Treatment: Gait belt Activity Tolerance: Patient limited by fatigue;Treatment limited secondary to agitation Patient left: in chair;with call bell/phone within reach;with family/visitor present Nurse Communication: Mobility status         Time: 1134-1200 PT Time Calculation (min) (ACUTE ONLY): 26 min   Charges:   PT Evaluation $Initial PT Evaluation Tier I: 1 Procedure PT Treatments $Self Care/Home Management: 8-22   PT G CodesFabio Asa:        Kapena Hamme J 12/30/2014, 6:05 PM Charlotte Crumbevon Ashara Lounsbury, PT DPT  [phone]

## 2014-12-30 NOTE — Progress Notes (Signed)
Daughter has concerns that patient has high stroke risk factors: HTN, Chronic headaches, family (maternal) hx frontal CVA. She wonders if patient had a stroke event/confusion, when she took the excessive amount of pills.

## 2014-12-31 ENCOUNTER — Inpatient Hospital Stay (HOSPITAL_COMMUNITY): Payer: Medicaid Other

## 2014-12-31 DIAGNOSIS — I160 Hypertensive urgency: Secondary | ICD-10-CM | POA: Insufficient documentation

## 2014-12-31 DIAGNOSIS — R4182 Altered mental status, unspecified: Secondary | ICD-10-CM | POA: Insufficient documentation

## 2014-12-31 DIAGNOSIS — R410 Disorientation, unspecified: Secondary | ICD-10-CM

## 2014-12-31 MED ORDER — HALOPERIDOL LACTATE 5 MG/ML IJ SOLN
2.0000 mg | Freq: Once | INTRAMUSCULAR | Status: AC
  Administered 2014-12-31: 2 mg via INTRAVENOUS

## 2014-12-31 MED ORDER — RISPERIDONE 0.5 MG PO TABS
0.5000 mg | ORAL_TABLET | Freq: Every day | ORAL | Status: DC
  Administered 2015-01-01: 0.5 mg via ORAL
  Filled 2014-12-31 (×3): qty 1

## 2014-12-31 MED ORDER — LORAZEPAM 2 MG/ML IJ SOLN
2.0000 mg | INTRAMUSCULAR | Status: DC | PRN
  Administered 2014-12-31: 2 mg via INTRAVENOUS
  Filled 2014-12-31 (×2): qty 1
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
## 2014-12-31 NOTE — Progress Notes (Signed)
Routine EEG completed, results pending. 

## 2014-12-31 NOTE — Procedures (Signed)
ELECTROENCEPHALOGRAM REPORT   Patient: [name]      Room #: 2C-11 Age: 59 y.o.        Sex: female Referring Physician: Dr Rhona Leavenshiu Report Date:  12/31/2014        Interpreting Physician: Omelia BlackwaterSUMNER, Adaria Hole JUSTIN  History: [name] is an 59 y.o. female hx of altered mental status with suspected OD on gabapentin/narcotics  Medications:  Scheduled: . amLODipine  10 mg Oral Daily  . antiseptic oral rinse  15 mL Mouth Rinse 6 times per day  . famotidine  20 mg Oral Daily  . heparin  5,000 Units Subcutaneous 3 times per day  . hydrALAZINE  25 mg Oral 3 times per day  . ipratropium-albuterol  3 mL Nebulization BID  . lisinopril  20 mg Oral Daily  . risperiDONE  0.5 mg Oral QHS    Conditions of Recording:  This is a 16 channel EEG carried out with the patient in the drowsy state.  Description:  The waking background activity consists predominantly of a low voltage, symmetrical, fairly well organized, theta activity with intermittent brief delta activity. With arousal there are brief periods of posterior alpha rhythm in the 8-10 Hz range.  Low voltage fast activity, poorly organized, is seen anteriorly and is at times superimposed on more posterior regions.  No focal slowing or epileptiform activity is noted.  The patient drowses with slowing to irregular, low voltage theta and beta activity. Normal sleep architecture is not obtained.   Hyperventilation and photic stimulation was not performed.    IMPRESSION: This is an abnormal EEG secondary to general background slowing.  This finding may be seen with a diffuse disturbance that is etiologically nonspecific, but may include a metabolic encephalopathy or polypharmacy, among other possibilities.  No epileptiform activity was noted.     Elspeth Choeter Kerrington Greenhalgh, DO [facility] [phone]  If 7pm- 7am, please page neurology on call as listed in AMION. 12/31/2014, 4:54 PM

## 2014-12-31 NOTE — Evaluation (Signed)
Occupational Therapy Evaluation Patient Details Name: [name] MRN: [id number] DOB: 1956/02/16 Today's Date: 12/31/2014    History of Present Illness patient is a 59 yo female who presents with Encephalopathy/confusion in setting of possible accidental opiate overdose, vs hypertensive encephalopathy.   Clinical Impression   Pt was independent prior to admission.  She presents with impaired attention and cognition, impaired balance and dependence in all ADL.  Will follow acutely. Recommending SNF if pt's cognition does not improve to the point that she does not require 24 hour care as her family cannot provide.    Follow Up Recommendations  SNF;Supervision/Assistance - 24 hour (depending on progress, family cannot provide 24 hour care)    Equipment Recommendations   (TBD)    Recommendations for Other Services       Precautions / Restrictions Precautions Precautions: Fall Precaution Comments: AMS, easily agitated      Mobility Bed Mobility Overal bed mobility: Needs Assistance Bed Mobility: Supine to Sit;Sit to Supine     Supine to sit: Supervision Sit to supine: Supervision      Transfers Overall transfer level: Needs assistance Equipment used: 1 person hand held assist Transfers: Sit to/from Stand Sit to Stand: Min assist         General transfer comment: min assist for stability upon standing    Balance Overall balance assessment: Needs assistance   Sitting balance-Leahy Scale: Fair       Standing balance-Leahy Scale: Poor                              ADL Overall ADL's : Needs assistance/impaired Eating/Feeding: Maximal assistance Eating/Feeding Details (indicate cue type and reason): can finger feed and drink with encouragement Grooming: Wash/dry face;Sitting;Total assistance   Upper Body Bathing: Total assistance;Sitting   Lower Body Bathing: Total assistance;Sit to/from stand   Upper Body Dressing : Total  assistance;Sitting   Lower Body Dressing: Total assistance;Sit to/from stand   Toilet Transfer: Minimal assistance;BSC   Toileting- Clothing Manipulation and Hygiene: Total assistance;Sit to/from stand       Functional mobility during ADLs: Minimal assistance General ADL Comments: Cognition interfering with ability to perform.     Vision Additional Comments: pts eyes are not aligned, family reports this has been the case x 1 year   Perception     Praxis      Pertinent Vitals/Pain Pain Assessment: Faces Pain Score: 0-No pain     Hand Dominance Right   Extremity/Trunk Assessment Upper Extremity Assessment Upper Extremity Assessment: Overall WFL for tasks assessed   Lower Extremity Assessment Lower Extremity Assessment: Defer to PT evaluation   Cervical / Trunk Assessment Cervical / Trunk Assessment: Normal   Communication Communication Communication: No difficulties   Cognition Arousal/Alertness: Awake/alert Behavior During Therapy: Restless Overall Cognitive Status: Impaired/Different from baseline Area of Impairment: Orientation;Attention;Memory;Following commands;Safety/judgement;Awareness;Problem solving Orientation Level: Disoriented to;Place;Time;Situation Current Attention Level: Focused Memory: Decreased short-term memory Following Commands: Follows one step commands inconsistently Safety/Judgement: Decreased awareness of safety;Decreased awareness of deficits   Problem Solving: Slow processing;Decreased initiation;Difficulty sequencing;Requires verbal cues;Requires tactile cues     General Comments       Exercises       Shoulder Instructions      Home Living Family/patient expects to be discharged to:: Private residence Living Arrangements: Children Available Help at Discharge: Family;Available 24 hours/day Type of Home: Mobile home Home Access: Stairs to enter Entrance Stairs-Number of Steps: 4 Entrance Stairs-Rails: Can reach  both        Bathroom Shower/Tub: Chief Strategy OfficerTub/shower unit   Bathroom Toilet: Standard                Prior Functioning/Environment Level of Independence: Independent             OT Diagnosis: Generalized weakness;Cognitive deficits;Altered mental status   OT Problem List: Impaired balance (sitting and/or standing);Decreased cognition;Decreased safety awareness;Decreased knowledge of use of DME or AE   OT Treatment/Interventions: Self-care/ADL training;Cognitive remediation/compensation;Therapeutic activities;Patient/family education;Balance training;DME and/or AE instruction    OT Goals(Current goals can be found in the care plan section) Acute Rehab OT Goals Patient Stated Goal: none stated OT Goal Formulation: With family Time For Goal Achievement: 01/14/15 Potential to Achieve Goals: Good ADL Goals Pt Will Perform Eating: sitting;with min assist Pt Will Perform Grooming: with min assist;standing Pt Will Perform Upper Body Dressing: with min assist;sitting Pt Will Perform Lower Body Dressing: with min assist;sit to/from stand Pt Will Transfer to Toilet: with min guard assist;ambulating;regular height toilet Pt Will Perform Toileting - Clothing Manipulation and hygiene: with min assist;sit to/from stand Additional ADL Goal #1: Pt will follow one step directions with 50 % accuracy.  OT Frequency: Min 2X/week   Barriers to D/C: Decreased caregiver support          Co-evaluation              End of Session    Activity Tolerance: Patient tolerated treatment well Patient left: in bed;with call bell/phone within reach;with bed alarm set;with family/visitor present;with restraints reapplied   Time: [id number] OT Time Calculation (min): 24 min Charges:  OT General Charges $OT Visit: 1 Procedure OT Evaluation $Initial OT Evaluation Tier I: 1 Procedure OT Treatments $Self Care/Home Management : 8-22 mins G-Codes:    Evern BioMayberry, Alaira Level Lynn 12/31/2014, 10:58 AM  [phone]

## 2014-12-31 NOTE — Progress Notes (Signed)
[facility] PROGRESS NOTE  [name]:[id number] DOB: 1956/07/25 DOA: 12/27/2014 PCP: Sherrie Mustache, MD  Assessment/Plan: 1. Encephalopathy/confusion -etiology suspected to be from OD (gabapentin/narcotics) vs hypertensive encephalopathy -mental status had been improving but pt noted to be confused this AM -Utox positives for opiates only -CT unremarkable, no evidence of infectious process -Psych consulted, daughter suspects narcotic abuse, may be getting pills from neighbor too -sitter at bedside -Given continued confusion, will check MRI brain  2. Accelerated HTN -Initially required labetalol gtt, now off -BP remains stable -continue on lisinopril, PO hydralazine, and amlodipine  3. H/o AVR -Stable  4. Chronic back pain - Currently off narcotics per above, tylenol PRN  5. Hyperthyroidism -TSH normal, free T4 mildly up  Code Status: Full Family Communication: Pt in room Disposition Plan: Pending   Consultants:  Psychiatry  Procedures:    Antibiotics:  None  HPI/Subjective: Pt noted to be confused this AM  Objective: Filed Vitals:   12/31/14 1000 12/31/14 1100 12/31/14 1200 12/31/14 1334  BP: 141/66 128/104 146/92   Pulse: 99 102 101   Temp:    97.2 F (36.2 C)  TempSrc:    Oral  Resp: Height:      Weight:      SpO2: 95% 95% 97%     Intake/Output Summary (Last 24 hours) at 12/31/14 1604 Last data filed at 12/30/14 1800  Gross per 24 hour  Intake      0 ml  Output      0 ml  Net      0 ml   Filed Weights   12/29/14 0425 12/30/14 0500 12/31/14 0500  Weight: 64 kg (141 lb 1.5 oz) 63.5 kg (139 lb 15.9 oz) 63.5 kg (139 lb 15.9 oz)    Exam:   General:  Awake, confused, in nad  Cardiovascular: regular, s1, s2  Respiratory: normal resp effort, no wheezing  Abdomen: soft, nondistended  Musculoskeletal: perfused, no clubbing   Data Reviewed: Basic Metabolic Panel:  Recent Labs Lab 12/27/14 1557 12/28/14 0240  12/28/14 2114  NA 133* 135 133*  K 4.1 3.9 4.1  CL 102 103 99  CO2 GLUCOSE 87 111* 110*  BUN CREATININE 0.94 1.05 0.95  CALCIUM 9.1 8.8 9.0  MG  --  1.5 2.8*  PHOS  --  4.0  --    Liver Function Tests:  Recent Labs Lab 12/27/14 1557 12/28/14 0240  AST 33 26  ALT 13 10  ALKPHOS 136* 125*  BILITOT 0.9 0.8  PROT 7.5 6.2  ALBUMIN 3.9 3.1*   No results for input(s): LIPASE, AMYLASE in the last 168 hours. No results for input(s): AMMONIA in the last 168 hours. CBC:  Recent Labs Lab 12/27/14 1557 12/28/14 0240  WBC 8.4 8.2  HGB 14.7 13.0  HCT 43.6 38.9  MCV 87.4 87.0  PLT 332 283   Cardiac Enzymes:  Recent Labs Lab 12/27/14 1557  CKTOTAL 87  CKMB 4.1*   BNP (last 3 results) No results for input(s): BNP in the last 8760 hours.  ProBNP (last 3 results) No results for input(s): PROBNP in the last 8760 hours.  CBG:  Recent Labs Lab 12/27/14 1617 12/28/14 0014 12/28/14 0103  GLUCAP 88 104* 105*    Recent Results (from the past 240 hour(s))  Clostridium Difficile by PCR     Status: None   Collection Time: 12/27/14  5:18 PM  Result Value Ref Range Status  C difficile by pcr NEGATIVE NEGATIVE Final  MRSA PCR Screening     Status: None   Collection Time: 12/28/14  3:30 AM  Result Value Ref Range Status   MRSA by PCR NEGATIVE NEGATIVE Final    Comment:        The GeneXpert MRSA Assay (FDA approved for NASAL specimens only), is one component of a comprehensive MRSA colonization surveillance program. It is not intended to diagnose MRSA infection nor to guide or monitor treatment for MRSA infections.      Studies: No results found.  Scheduled Meds: . amLODipine  10 mg Oral Daily  . antiseptic oral rinse  15 mL Mouth Rinse 6 times per day  . famotidine  20 mg Oral Daily  . heparin  5,000 Units Subcutaneous 3 times per day  . hydrALAZINE  25 mg Oral 3 times per day  . ipratropium-albuterol  3 mL Nebulization BID  .  lisinopril  20 mg Oral Daily  . risperiDONE  0.5 mg Oral QHS   Continuous Infusions: . 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 If 7PM-7AM, please contact night-coverage at [email or web address], password [email or web address] 12/31/2014, 4:04 PM  LOS: 4 days

## 2015-01-01 ENCOUNTER — Inpatient Hospital Stay (HOSPITAL_COMMUNITY): Payer: Medicaid Other

## 2015-01-01 LAB — CBC
HCT: 37.6 % (ref 36.0–46.0)
Hemoglobin: 12.7 g/dL (ref 12.0–15.0)
MCH: 29.2 pg (ref 26.0–34.0)
MCHC: 33.8 g/dL (ref 30.0–36.0)
MCV: 86.4 fL (ref 78.0–100.0)
PLATELETS: 255 10*3/uL (ref 150–400)
RBC: 4.35 MIL/uL (ref 3.87–5.11)
RDW: 15.9 % — ABNORMAL HIGH (ref 11.5–15.5)
WBC: 5.7 10*3/uL (ref 4.0–10.5)

## 2015-01-01 LAB — BLOOD GAS, ARTERIAL
Acid-base deficit: 0.3 mmol/L (ref 0.0–2.0)
Bicarbonate: 23.5 mEq/L (ref 20.0–24.0)
Drawn by: 406621
O2 Content: 2 L/min
O2 SAT: 98.9 %
PATIENT TEMPERATURE: 98.6
PH ART: 7.43 (ref 7.350–7.450)
TCO2: 24.6 mmol/L (ref 0–100)
pCO2 arterial: 35.9 mmHg (ref 35.0–45.0)
pO2, Arterial: 134 mmHg — ABNORMAL HIGH (ref 80.0–100.0)

## 2015-01-01 LAB — BASIC METABOLIC PANEL
ANION GAP: 9 (ref 5–15)
BUN: 6 mg/dL (ref 6–23)
CALCIUM: 8.9 mg/dL (ref 8.4–10.5)
CHLORIDE: 101 mmol/L (ref 96–112)
CO2: 24 mmol/L (ref 19–32)
Creatinine, Ser: 0.91 mg/dL (ref 0.50–1.10)
GFR calc Af Amer: 78 mL/min — ABNORMAL LOW (ref >90)
GFR calc non Af Amer: 68 mL/min — ABNORMAL LOW (ref >90)
Glucose, Bld: 99 mg/dL (ref 70–99)
Potassium: 3 mmol/L — ABNORMAL LOW (ref 3.5–5.1)
SODIUM: 134 mmol/L — AB (ref 135–145)

## 2015-01-01 MED ORDER — CLONIDINE HCL 0.1 MG PO TABS
0.1000 mg | ORAL_TABLET | Freq: Every day | ORAL | Status: DC
Start: 2015-01-06 — End: 2015-01-04
  Administered 2015-01-04: 0.1 mg via ORAL

## 2015-01-01 MED ORDER — METHOCARBAMOL 500 MG PO TABS
500.0000 mg | ORAL_TABLET | Freq: Three times a day (TID) | ORAL | Status: DC | PRN
  Administered 2015-01-01: 500 mg via ORAL
  Filled 2015-01-01 (×2): qty 1

## 2015-01-01 MED ORDER — HYDROXYZINE HCL 25 MG PO TABS
25.0000 mg | ORAL_TABLET | Freq: Four times a day (QID) | ORAL | Status: DC | PRN
  Administered 2015-01-01: 25 mg via ORAL
  Filled 2015-01-01 (×2): qty 1

## 2015-01-01 MED ORDER — CLONIDINE HCL 0.1 MG PO TABS
0.1000 mg | ORAL_TABLET | ORAL | Status: DC
  Administered 2015-01-03 – 2015-01-04 (×2): 0.1 mg via ORAL
  Filled 2015-01-01 (×4): qty 1

## 2015-01-01 MED ORDER — ONDANSETRON 4 MG PO TBDP
4.0000 mg | ORAL_TABLET | Freq: Four times a day (QID) | ORAL | Status: DC | PRN
  Administered 2015-01-01: 4 mg via ORAL
  Filled 2015-01-01 (×2): qty 1

## 2015-01-01 MED ORDER — POTASSIUM CHLORIDE CRYS ER 20 MEQ PO TBCR
40.0000 meq | EXTENDED_RELEASE_TABLET | Freq: Two times a day (BID) | ORAL | Status: AC
  Administered 2015-01-01: 40 meq via ORAL
  Filled 2015-01-01: qty 2

## 2015-01-01 MED ORDER — LOPERAMIDE HCL 2 MG PO CAPS: 2.0000 mg | ORAL_CAPSULE | ORAL | Status: DC | PRN

## 2015-01-01 MED ORDER — CLONIDINE HCL 0.1 MG PO TABS
0.1000 mg | ORAL_TABLET | Freq: Four times a day (QID) | ORAL | Status: AC
Start: 2015-01-01 — End: 2015-01-03
  Administered 2015-01-01 – 2015-01-03 (×7): 0.1 mg via ORAL
  Filled 2015-01-01 (×10): qty 1

## 2015-01-01 MED ORDER — HALOPERIDOL LACTATE 5 MG/ML IJ SOLN: 2.0000 mg | Freq: Once | INTRAMUSCULAR | Status: DC

## 2015-01-01 MED ORDER — NAPROXEN 500 MG PO TABS
500.0000 mg | ORAL_TABLET | Freq: Two times a day (BID) | ORAL | Status: DC | PRN
  Administered 2015-01-01: 500 mg via ORAL
  Filled 2015-01-01 (×2): qty 1

## 2015-01-01 MED ORDER — DICYCLOMINE HCL 20 MG PO TABS
20.0000 mg | ORAL_TABLET | Freq: Four times a day (QID) | ORAL | Status: DC | PRN
  Filled 2015-01-01: qty 1

## 2015-01-01 NOTE — Progress Notes (Signed)
[facility] PROGRESS NOTE  [name] WUJ:[id number] DOB: October 20, 1955 DOA: 12/27/2014 PCP: Sherrie Mustache, MD  Assessment/Plan: 1. Encephalopathy/confusion -etiology suspected to be from OD (gabapentin/narcotics) vs hypertensive encephalopathy -mental status had been improving but pt continues to be confused -Utox positives for opiates only -CT unremarkable, no evidence of infectious process -Psych consulted, daughter suspects narcotic abuse, may be getting pills from neighbor too - Discussed with daughter at bedside. Family has found empty bottles of muscle relaxant and pain pills at home -sitter at bedside -MRI brain unremarkable. EEG brain with non-specific slowing  2. Accelerated HTN -Initially required labetalol gtt, now off -BP remains stable -Pt is continued on lisinopril, PO hydralazine, and amlodipine  3. H/o AVR -Stable  4. Chronic back pain - Currently off narcotics per above, - Have ordered clonidine protocol for opioid withdrawal. Noted to be tachycardic today  5. Hyperthyroidism -TSH normal, free T4 mildly up  Code Status: Full Family Communication: Pt in room, daughter at bedside Disposition Plan: Pending   Consultants:  Psychiatry  Procedures:    Antibiotics:  None  HPI/Subjective: Continues to be confused. Pt asking about pain clinic appointment for this week  Objective: Filed Vitals:   01/01/15 0750 01/01/15 1100 01/01/15 1106 01/01/15 1200  BP:  164/72 114/83 113/90  Pulse:  114 112 121  Temp:      TempSrc:      Resp:  Height:      Weight:      SpO2: 92% 93% 94% 93%    Intake/Output Summary (Last 24 hours) at 01/01/15 1333 Last data filed at 01/01/15 0916  Gross per 24 hour  Intake    240 ml  Output      0 ml  Net    240 ml   Filed Weights   12/30/14 0500 12/31/14 0500 01/01/15 0500  Weight: 63.5 kg (139 lb 15.9 oz) 63.5 kg (139 lb 15.9 oz) 64 kg (141 lb 1.5 oz)    Exam:   General:  Awake, confused,  in nad  Cardiovascular: regular, s1, s2  Respiratory: normal resp effort, no wheezing  Abdomen: soft, nondistended  Musculoskeletal: perfused, no clubbing   Data Reviewed: Basic Metabolic Panel:  Recent Labs Lab 12/27/14 1557 12/28/14 0240 12/28/14 2114 01/01/15 0419  NA 133* 135 133* 134*  K 4.1 3.9 4.1 3.0*  CL 102 103 99 101  CO2 GLUCOSE 87 111* 110* 99  BUN CREATININE 0.94 1.05 0.95 0.91  CALCIUM 9.1 8.8 9.0 8.9  MG  --  1.5 2.8*  --   PHOS  --  4.0  --   --    Liver Function Tests:  Recent Labs Lab 12/27/14 1557 12/28/14 0240  AST 33 26  ALT 13 10  ALKPHOS 136* 125*  BILITOT 0.9 0.8  PROT 7.5 6.2  ALBUMIN 3.9 3.1*   No results for input(s): LIPASE, AMYLASE in the last 168 hours. No results for input(s): AMMONIA in the last 168 hours. CBC:  Recent Labs Lab 12/27/14 1557 12/28/14 0240 01/01/15 0419  WBC 8.4 8.2 5.7  HGB 14.7 13.0 12.7  HCT 43.6 38.9 37.6  MCV 87.4 87.0 86.4  PLT 332 283 255   Cardiac Enzymes:  Recent Labs Lab 12/27/14 1557  CKTOTAL 87  CKMB 4.1*   BNP (last 3 results) No results for input(s): BNP in the last 8760 hours.  ProBNP (last 3 results) No results for input(s): PROBNP in  the last 8760 hours.  CBG:  Recent Labs Lab 12/27/14 1617 12/28/14 0014 12/28/14 0103  GLUCAP 88 104* 105*    Recent Results (from the past 240 hour(s))  Clostridium Difficile by PCR     Status: None   Collection Time: 12/27/14  5:18 PM  Result Value Ref Range Status   C difficile by pcr NEGATIVE NEGATIVE Final  MRSA PCR Screening     Status: None   Collection Time: 12/28/14  3:30 AM  Result Value Ref Range Status   MRSA by PCR NEGATIVE NEGATIVE Final    Comment:        The GeneXpert MRSA Assay (FDA approved for NASAL specimens only), is one component of a comprehensive MRSA colonization surveillance program. It is not intended to diagnose MRSA infection nor to guide or monitor treatment for MRSA  infections.      Studies: Mr Brain Wo Contrast  12/31/2014   CLINICAL DATA:  Encephalopathy and confusion. Suspected overdose. Initial encounter.  EXAM: MRI HEAD WITHOUT CONTRAST  TECHNIQUE: Multiplanar, multiecho pulse sequences of the brain and surrounding structures were obtained without intravenous contrast.  COMPARISON:  CT head 12/27/2014.  FINDINGS: The patient was unable to remain motionless for the exam. Small or subtle lesions could be overlooked.  No evidence for acute infarction, hemorrhage, mass lesion, hydrocephalus, or extra-axial fluid. Premature for age cerebral and cerebellar atrophy. Mild subcortical and periventricular T2 and FLAIR hyperintensities, likely chronic microvascular ischemic change.  Flow voids are maintained throughout the carotid, basilar, and vertebral arteries. LEFT vertebral dominant. There are no areas of chronic hemorrhage. Scattered chronic lacunar infarcts. BILATERAL cataract extraction. No sinus disease. BILATERAL mastoid effusions, likely non worrisome. Scalp soft tissues unremarkable.  IMPRESSION: Motion degraded exam demonstrating premature atrophy, but no acute intracranial findings.   Electronically Signed   By: Davonna BellingJohn  Curnes M.D.   On: 12/31/2014 19:08    Scheduled Meds: . amLODipine  10 mg Oral Daily  . antiseptic oral rinse  15 mL Mouth Rinse 6 times per day  . cloNIDine  0.1 mg Oral QID   Followed by  . [START ON 01/03/2015] cloNIDine  0.1 mg Oral BH-qamhs   Followed by  . [START ON 01/06/2015] cloNIDine  0.1 mg Oral QAC breakfast  . famotidine  20 mg Oral Daily  . heparin  5,000 Units Subcutaneous 3 times per day  . hydrALAZINE  25 mg Oral 3 times per day  . ipratropium-albuterol  3 mL Nebulization BID  . lisinopril  20 mg Oral Daily  . potassium chloride  40 mEq Oral BID  . risperiDONE  0.5 mg Oral QHS   Continuous Infusions: . sodium chloride 10 mL/hr at 12/28/14 0448    Principal Problem:   Opioid intoxication delirium with moderate or  severe use disorder Active Problems:   Back pain   S/P AVR (aortic valve replacement)   Hypertensive emergency   Acute encephalopathy   Chronic back pain   Altered mental status   Hypertensive urgency   [name]  [facility] Pager [phone]. If 7PM-7AM, please contact night-coverage at [email or web address], password [email or web address] 01/01/2015, 1:33 PM  LOS: 5 days

## 2015-01-01 NOTE — Clinical Social Work Note (Signed)
CSW [place]  Pt dtr, Melissa, is concerned about pt returning home considering her current mental status- pt still has medical issues that are prohibiting DC and Efraim KaufmannMelissa is unsure if these issues are contributing to pt mental status or if pt will be disoriented on a permanent basis due to long term drug use.  CSW provided active listening and support.  CSW will continue to follow and assist pt family as needed.  Merlyn LotJenna Holoman, LCSWA Clinical Social Worker [phone]

## 2015-01-01 NOTE — Progress Notes (Signed)
NP notified that pt is very restless after prn haldol, pt currently has no sitter. Heart rate staying in the 120's. Will monitor.

## 2015-01-01 NOTE — Progress Notes (Signed)
Physical Therapy Treatment Patient Details Name: Abigail PoeDonna S Coop MRN: [id number] DOB: 09-12-1956 Today's Date: 01/01/2015    History of Present Illness patient is a 59 yo female who presents with Encephalopathy/confusion in setting of possible accidental opiate overdose, vs hypertensive encephalopathy.    PT Comments    Patient with flat affect this session, tolerated ambulation with increased physical assist. OF NOTE: patient with change in physical presentation Re: right eye with left gaze noted, attempted to perform visual assessment, poor tracking ability but question visual vs cognitive cause. Patient also demonstrates nystagmus in right eye with lateral beating seen both upon assessment of vision field as well as during mobility when attempting to direct patient gaze to fixed objects in pathway. MD and nsg notified and aware. Given patient current impairments in conjunction with very modest progress, recommend consideration of discharge update to ST SNF. Feel patient will need increased level of rehabilitation to improve functional status and safety. Will continue to see and progress as tolerated.   Follow Up Recommendations  SNF;Supervision/Assistance - 24 hour;Supervision for mobility/OOB     Equipment Recommendations  Other (comment) (tbd)    Recommendations for Other Services       Precautions / Restrictions Precautions Precautions: Fall Precaution Comments: AMS    Mobility  Bed Mobility Overal bed mobility: Needs Assistance Bed Mobility: Supine to Sit;Sit to Supine     Supine to sit: Min assist Sit to supine: Min assist   General bed mobility comments: assist to rotate trunk to EOB, assist to elevate LEs upon return to supine, +2 assist to reposition in bed  Transfers Overall transfer level: Needs assistance Equipment used: 1 person hand held assist Transfers: Sit to/from Stand Sit to Stand: Min assist         General transfer comment: Min assist for  stability  Ambulation/Gait Ambulation/Gait assistance: Min assist;Mod assist Ambulation Distance (Feet): 90 Feet Assistive device: 1 person hand held assist Gait Pattern/deviations: Step-through pattern;Decreased stride length;Ataxic;Staggering left;Staggering right;Narrow base of support Gait velocity: decreased Gait velocity interpretation: Below normal speed for age/gender General Gait Details: Very poor coordination of gait, instability noted, easily distraced with increased LOB when distracted from task, also question visual deficits with mobility today.    Stairs            Wheelchair Mobility    Modified Rankin (Stroke Patients Only)       Balance     Sitting balance-Leahy Scale: Fair       Standing balance-Leahy Scale: Poor Standing balance comment:  continues to have poor ability to maintain static standing without corrective steps                    Cognition Arousal/Alertness: Awake/alert Behavior During Therapy: Flat affect Overall Cognitive Status: Impaired/Different from baseline Area of Impairment: Orientation;Attention;Memory;Following commands;Safety/judgement;Awareness;Problem solving Orientation Level: Disoriented to;Place;Time;Situation Current Attention Level: Focused Memory: Decreased short-term memory Following Commands: Follows one step commands with increased time Safety/Judgement: Decreased awareness of safety;Decreased awareness of deficits   Problem Solving: Slow processing;Decreased initiation;Difficulty sequencing;Requires verbal cues;Requires tactile cues General Comments: patient continues do demonstrate significant cognitive deficits throughout session    Exercises      General Comments General comments (skin integrity, edema, etc.): patient with change in physical rpesentation Re: right eye with left gaze noted, attempted to perform visual assessment, poor tracking ability but question visual vs cognitive cause. Patient  also demonstrates nystagmus in right eye with lateral beating seen both upon assessment of vision field as  well as during mobility when attempting to direct patient gaze to fixed objects in pathway.  (nsg and MD notified and aware)      Pertinent Vitals/Pain Pain Assessment: Faces Pain Score: 0-No pain Faces Pain Scale: No hurt    Home Living                      Prior Function            PT Goals (current goals can now be found in the care plan section) Acute Rehab PT Goals Patient Stated Goal: none stated PT Goal Formulation: Patient unable to participate in goal setting Time For Goal Achievement: 01/13/15 Potential to Achieve Goals: Good Progress towards PT goals: Progressing toward goals    Frequency  Min 3X/week    PT Plan Discharge plan needs to be updated    Co-evaluation             End of Session Equipment Utilized During Treatment: Gait belt Activity Tolerance: Patient limited by fatigue;Treatment limited secondary to agitation Patient left: in bed;with call bell/phone within reach;with nursing/sitter in room;with restraints reapplied     Time: 9562-1308 PT Time Calculation (min) (ACUTE ONLY): 24 min  Charges:  $Gait Training: 8-22 mins $Therapeutic Activity: 8-22 mins                    G CodesFabio Asa January 28, 2015, 4:12 PM Charlotte Crumb, PT DPT  [phone]

## 2015-01-01 NOTE — Consult Note (Signed)
Psychiatry Consult follow-up  Reason for Consult:  AMS, depression and overdose Referring Physician:  Dr. 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 Altered mental status [R41.82]   . Hypertensive urgency [I10]   . Acute encephalopathy [G93.40] 12/29/2014  . Chronic back pain [M54.9, G89.29] 12/29/2014  . 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 Hypertensive emergency [I10] 12/27/2014  . Smoker [Z72.0] 05/09/2014  . S/P AVR (aortic valve replacement) [Z95.4] 02/01/2013  . Hypertension [I10] 01/01/2013  . Itching [L29.9] 05/10/2012  . Dizziness [R42] 04/09/2012  . CAD (coronary artery disease) [I25.10] 04/09/2012  . Tachycardia [R00.0] 01/05/2012  . Aortic valve stenosis [I35.0] 11/18/2011  . Chronic chest wall pain [R07.89, G89.29] 11/18/2011  . Shortness of breath [R06.02] 11/18/2011  . Back pain [M54.9] 11/18/2011    Total Time spent with patient: 30 minutes  Subjective:   [name] is a 59 y.o. female patient admitted with overdose of medication.  HPI: [name] is a 59 years old female seen and chart reviewed for psychiatric consultation regarding opiate overdose. Patient is awake, alert oriented to her first name last name name of the hospital but not oriented to conditions brought her to the hospital. Patient does not remember being combative on admission. Patient reported she has been taking 1 tablet of pain medication daily and she was taken 3 pills on day of admission. Patient urine drug screen is positive for opiates. Patient does not remember how she was ended up coming to the hospital. Patient was reported chronic pain syndrome and also become combative at the time of admission. Patient denied symptoms of depression, anxiety, mania, psychosis  and denied current current suicidal/homicidal ideation, intention or plans. Patient has denied alcoholism and drug of abuse. Patient reported she lives with her son was 49 old in [place], [place]. Will ask psychiatric social service to contact her family members regarding collateral information.   01/01/2015  Interval history: Patient has no complaints and stayed in her bed with soft restraints due to trying to get out of bed and continue to be confused and thinking that she can go home without medical discharge. Reportedly had haldol IM for agitation and confusion last evening and required soft restraints on her both upper extremities. Patient is a poor historian and could not provide behavioral or emotional problems.   Patient daughter observe that she has been in and out of the confusion and her mental status is clearly fluctuating within hours. Patient  son lives with her. Patient  has been unintentionally overdosing her pain medication and large amount of gabapentin. She has chronic pain syndromes and  history of opioid abuse. Reportedly she has betaking pain medication from neighbor's too. Receives opioids from pain clinic in [place]. PaDoes not take blood pressure  medication and has chronic headache and taking more pain pills. Patient continued to be delirious but appropriate during this visit. Has no significant opioid withdrawal symptoms at this time. Patient has previous history of opioid overdose but not intentionally and never had a suicidal attempts.  Patient has no history of mental illness or psychiatric outpatient services.   Medical history: Patient cannot give history right now, history taken from her daughter bedside. This is a 47F with multiple medical history, who apparently also has chronic pain on her back and leg.  Apparently about 2 days ago she started becoming combative and had some headache. No fever, no chills, no chest pain, no vision changes, no cough, no N/V, no abd  pain, no diarrhea, no syncope. No seizures. According to her daughter, usually when she acts like this it means she is taking too much of her pain medications and she also sometimes take medications from other people for pain. Her daughter did a pill count and she is missing a lot of gabapentin and some levothyroxine pills. Also maybe a few pills of opiates. Also she has not been taking her HTN meds for at least 4 days. CT head no acute findings. She was very combative in ED needing some antipsychotic and also very hypertensive needing cardene drip. Urine positive only for opiates. Serum TSH normal, FT4 only slightly elevated at 1.9  PAST MEDICAL HISTORY : Patient has a past medical history of Smoking history; Aortic valve stenosis; Back pain, chronic; Chest pain; SOB (shortness of breath); Coronary artery disease; CHF (congestive heart failure); Dyslipidemia; Thyroid disease; Barrett esophagus; GERD (gastroesophageal reflux disease); PUD (peptic ulcer disease); Depression; Deaf; Sinus congestion; Wheezing; Rash, skin; Seizures; Hypothyroidism; Hypertension; Arthritis; and PONV (postoperative nausea and vomiting). Patient has past surgical history that includes Cholecystectomy; Cesarean section; Vaginal hysterectomy; Tonsillectomy and adenoidectomy; Aortic valve replacement (02/20/2012); and Cardiac catheterization (4/2011l;12/2011).   Past Medical History:  Past Medical History  Diagnosis Date  . Smoking history   . Aortic valve stenosis   . Back pain, chronic   . Chest pain   . SOB (shortness of breath)   . Coronary artery disease     mild; non-hemodynamically significant  . CHF (congestive heart failure)   . Dyslipidemia   . Thyroid disease     hypothyroidism  . Barrett esophagus   . GERD (gastroesophageal reflux disease)     with PUD  . PUD (peptic ulcer disease)   . Depression   . Deaf   . Sinus congestion   . Wheezing   . Rash, skin     2 weeks ago, bilateral hand rash, peeling,  eruptions   . Seizures     as a child at age 1  . Hypothyroidism   . Hypertension   . Arthritis     back, hips, legs  . PONV (postoperative nausea and vomiting)     Past Surgical History  Procedure Laterality Date  . Cholecystectomy    . Cesarean section      x 2  . Vaginal hysterectomy    . Tonsillectomy and adenoidectomy      at age 54  . Aortic valve replacement  02/20/2012    Procedure: AORTIC VALVE REPLACEMENT (AVR);  Surgeon: Melrose Nakayama, MD;  Location: [facility];  Service: Open Heart Surgery;  Laterality: N/A;  . Cardiac catheterization  4/2011l;12/2011   Family History:  Family History  Problem Relation Age of Onset  . Heart attack Father   . Heart disease Neg Hx     heart valve problems in other family members  . Dementia Mother   . Hypertension Mother    Social History:  History  Alcohol Use No     History  Drug Use No    History   Social History  . Marital Status: Divorced    Spouse Name: N/A  . Number of Children: N/A  . Years of Education: N/A   Occupational History  . disabled     due to her nerves, chronic back pain and migrain  headaches   Social History Main Topics  . Smoking status: Current Every Day Smoker -- 0.50 packs/day for 30 years    Types: Cigarettes  . Smokeless tobacco: Not on file  . Alcohol Use: No  . Drug Use: No  . Sexual Activity: Not on file   Other Topics Concern  . None   Social History Narrative   Lives at home and cares for her mother who has dementia and also cares for her disabled son as well.      Smokes 1 ppd x 30 yrs      Pt is deaf            Additional Social History:                          Allergies:   Allergies  Allergen Reactions  . Chlorine     Bleach, rash hands  . Other     CHG hand soap - rash    Labs:  Results for orders placed or performed during the hospital encounter of 12/27/14 (from the past 48 hour(s))  CBC     Status: Abnormal   Collection Time: 01/01/15  4:19  AM  Result Value Ref Range   WBC 5.7 4.0 - 10.5 K/uL   RBC 4.35 3.87 - 5.11 MIL/uL   Hemoglobin 12.7 12.0 - 15.0 g/dL   HCT 37.6 36.0 - 46.0 %   MCV 86.4 78.0 - 100.0 fL   MCH 29.2 26.0 - 34.0 pg   MCHC 33.8 30.0 - 36.0 g/dL   RDW 15.9 (H) 11.5 - 15.5 %   Platelets 255 150 - 400 K/uL  Basic metabolic panel     Status: Abnormal   Collection Time: 01/01/15  4:19 AM  Result Value Ref Range   Sodium 134 (L) 135 - 145 mmol/L   Potassium 3.0 (L) 3.5 - 5.1 mmol/L   Chloride 101 96 - 112 mmol/L   CO2 24 19 - 32 mmol/L   Glucose, Bld 99 70 - 99 mg/dL   BUN 6 6 - 23 mg/dL   Creatinine, Ser 0.91 0.50 - 1.10 mg/dL   Calcium 8.9 8.4 - 10.5 mg/dL   GFR calc non Af Amer 68 (L) >90 mL/min   GFR calc Af Amer 78 (L) >90 mL/min    Comment: (NOTE) The eGFR has been calculated using the CKD EPI equation. This calculation has not been validated in all clinical situations. eGFR's persistently <90 mL/min signify possible Chronic Kidney Disease.    Anion gap 9 5 - 15  Blood gas, arterial     Status: Abnormal   Collection Time: 01/01/15  8:58 AM  Result Value Ref Range   O2 Content 2.0 L/min   Delivery systems NASAL CANNULA    pH, Arterial 7.430 7.350 - 7.450   pCO2 arterial 35.9 35.0 - 45.0 mmHg   pO2, Arterial 134.0 (H) 80.0 - 100.0 mmHg   Bicarbonate 23.5 20.0 - 24.0 mEq/L   TCO2 24.6 0 - 100 mmol/L   Acid-base deficit 0.3 0.0 - 2.0 mmol/L   O2 Saturation 98.9 %   Patient temperature 98.6    Collection site RIGHT RADIAL    Drawn by [id number]    Sample type ARTERIAL DRAW    Allens test (pass/fail) PASS PASS    Vitals: Blood pressure 114/83, pulse 112, temperature 98.3 F (36.8 C), temperature source Axillary, resp. rate 17, height 5' (1.524 m), weight 64 kg (141 lb  1.5 oz), SpO2 94 %.  Risk to Self: Is patient at risk for suicide?: No Risk to Others:   Prior Inpatient Therapy:   Prior Outpatient Therapy:    Current Facility-Administered Medications  Medication Dose Route Frequency  Provider Last Rate Last Dose  . 0.9 %  sodium chloride infusion   Intravenous Continuous Abigail Miyamoto, MD 10 mL/hr at 12/28/14 0448    . acetaminophen (TYLENOL) tablet 650 mg  650 mg Oral Q4H PRN Abigail Quan, MD      . amLODipine (NORVASC) tablet 10 mg  10 mg Oral Daily Abigail Quan, MD   10 mg at 01/01/15 3419  . antiseptic oral rinse (BIOTENE) solution 15 mL  15 mL Mouth Rinse 6 times per day Abigail Miyamoto, MD   15 mL at 01/01/15 0000  . famotidine (PEPCID) tablet 20 mg  20 mg Oral Daily Abigail Polite, MD   20 mg at 01/01/15 3790  . haloperidol lactate (HALDOL) injection 1 mg  1 mg Intravenous Q6H PRN Abigail Polite, MD   1 mg at 01/01/15 1015  . haloperidol lactate (HALDOL) injection 2 mg  2 mg Intravenous Once Abigail Columbia, NP   2 mg at 01/01/15 0700  . heparin injection 5,000 Units  5,000 Units Subcutaneous 3 times per day Abigail Quan, MD   5,000 Units at 01/01/15 0000  . hydrALAZINE (APRESOLINE) tablet 25 mg  25 mg Oral 3 times per day Abigail Polite, MD   25 mg at 01/01/15 0800  . ipratropium-albuterol (DUONEB) 0.5-2.5 (3) MG/3ML nebulizer solution 3 mL  3 mL Nebulization BID Abigail Polite, MD   3 mL at 01/01/15 0750  . labetalol (NORMODYNE,TRANDATE) injection 10 mg  10 mg Intravenous Q2H PRN Abigail Polite, MD      . lisinopril (PRINIVIL,ZESTRIL) tablet 20 mg  20 mg Oral Daily Abigail Quan, MD   20 mg at 01/01/15 2409  . LORazepam (ATIVAN) injection 2 mg  2 mg Intravenous PRN Donne Hazel, MD   2 mg at 12/31/14 1744  . ondansetron (ZOFRAN) injection 4 mg  4 mg Intravenous Q6H PRN Abigail Quan, MD      . potassium chloride SA (K-DUR,KLOR-CON) CR tablet 40 mEq  40 mEq Oral BID Donne Hazel, MD   40 mEq at 01/01/15 0926  . risperiDONE (RISPERDAL) tablet 0.5 mg  0.5 mg Oral QHS Ambrose Finland, MD   0.5 mg at 01/01/15 0000    Musculoskeletal: Strength & Muscle Tone: decreased Gait & Station: unable to stand Patient leans:  N/A  Psychiatric Specialty Exam: Physical Exam as per history and physical   ROS chronic pain, somewhat confused and combative on admission   Blood pressure 114/83, pulse 112, temperature 98.3 F (36.8 C), temperature source Axillary, resp. rate 17, height 5' (1.524 m), weight 64 kg (141 lb 1.5 oz), SpO2 94 %.Body mass index is 27.56 kg/(m^2).  General Appearance: Guarded  Eye Contact::  Good  Speech:  Clear and Coherent  Volume:  Normal  Mood:  Anxious and Depressed  Affect:  Appropriate and Congruent  Thought Process:  Coherent and Loose  Orientation:  Full (Time, Place, and Person)  Thought Content:  WDL  Suicidal Thoughts:  No  Homicidal Thoughts:  No  Memory:  Immediate;   Fair Recent;   Poor  Judgement:  Impaired  Insight:  Shallow  Psychomotor Activity:  Decreased  Concentration:  Fair  Recall:  Poor  Fund of Knowledge:Fair  Language: Good  Akathisia:  Negative  Handed:  Right  AIMS (if indicated):     Assets:  Communication Skills Desire for Improvement Financial Resources/Insurance Housing Intimacy Leisure Time Resilience Social Support  ADL's:  Impaired  Cognition: Impaired,  Mild  Sleep:      Medical Decision Making: New problem, with additional work up planned, Review of Psycho-Social Stressors (1), Review or order clinical lab tests (1), Review of Last Therapy Session (1), Review or order medicine tests (1), Review of Medication Regimen & Side Effects (2) and Review of New Medication or Change in Dosage (2)  Treatment Plan Summary: Daily contact with patient to assess and evaluate symptoms and progress in treatment and Medication management  Plan: Continue Risperidone 0.5 mg PO Qhs for agitation and aggression  Patient does not meet criteria for psychiatric inpatient admission. Supportive therapy provided about ongoing stressors. Appreciate psychiatric consultation and follow up as clinically required Please contact [phone] or [phone] if needs  further assistance  Disposition: Patient does not required psychiatric hospitalization may be discharged home with outpatient medication management  at pain clinic and appropriate pain medication dispensing by other family members her Locked medication container.  Needham Biggins,JANARDHAHA R. 01/01/2015 11:44 AM

## 2015-01-01 NOTE — Progress Notes (Signed)
Restraints currently off patient. Pt is resting and has a very attentive safety sitter so a break from wrist restraints was appropriate.

## 2015-01-02 LAB — CBC
HCT: 35.8 % — ABNORMAL LOW (ref 36.0–46.0)
Hemoglobin: 11.8 g/dL — ABNORMAL LOW (ref 12.0–15.0)
MCH: 29.1 pg (ref 26.0–34.0)
MCHC: 33 g/dL (ref 30.0–36.0)
MCV: 88.2 fL (ref 78.0–100.0)
Platelets: 237 10*3/uL (ref 150–400)
RBC: 4.06 MIL/uL (ref 3.87–5.11)
RDW: 16.2 % — ABNORMAL HIGH (ref 11.5–15.5)
WBC: 5.6 10*3/uL (ref 4.0–10.5)

## 2015-01-02 LAB — BASIC METABOLIC PANEL
Anion gap: 4 — ABNORMAL LOW (ref 5–15)
BUN: 9 mg/dL (ref 6–23)
CO2: 29 mmol/L (ref 19–32)
Calcium: 8.9 mg/dL (ref 8.4–10.5)
Chloride: 103 mmol/L (ref 96–112)
Creatinine, Ser: 1.01 mg/dL (ref 0.50–1.10)
GFR, EST AFRICAN AMERICAN: 69 mL/min — AB (ref >90)
GFR, EST NON AFRICAN AMERICAN: 60 mL/min — AB (ref >90)
GLUCOSE: 88 mg/dL (ref 70–99)
Potassium: 3.8 mmol/L (ref 3.5–5.1)
Sodium: 136 mmol/L (ref 135–145)

## 2015-01-02 MED ORDER — RISPERIDONE 0.5 MG PO TABS
0.5000 mg | ORAL_TABLET | Freq: Every evening | ORAL | Status: DC | PRN
  Administered 2015-01-02: 0.5 mg via ORAL
  Filled 2015-01-02 (×2): qty 1

## 2015-01-02 NOTE — Progress Notes (Signed)
[facility] PROGRESS NOTE  [name] ZOX:[id number] DOB: [birth year] DOA: 12/27/2014 PCP: [name]  Assessment/Plan: 1. Encephalopathy/confusion -etiology suspected to be from OD (gabapentin/narcotics) vs hypertensive encephalopathy -mental status had been improving but pt continues to be confused -Utox positives for opiates only -CT unremarkable, no evidence of infectious process -Psych consulted, daughter suspects narcotic abuse, may be getting pills from neighbor too - Discussed with daughter over phone. Family has found empty bottles of muscle relaxant and pain pills at home -sitter remains at bedside -MRI brain unremarkable. EEG brain with non-specific slowing  2. Accelerated HTN -Initially required labetalol gtt, now off -BP remains stable -Pt is continued on lisinopril, PO hydralazine, and amlodipine  3. H/o AVR -Stable  4. Chronic back pain - Currently off narcotics per above - Have ordered clonidine protocol for opioid withdrawal - Discussed with pt's daughter, who is a CNA, over the phone. Daughter is concerned about opioid addiction per above. When daughter confronted patient about cutting off narcotics, pt stated she will go back to pharmacy for refills.  5. Hyperthyroidism -TSH normal, free T4 mildly up  Code Status: Full Family Communication: Pt in room, daughter over phone Disposition Plan: Pending   Consultants:  Psychiatry  Procedures:    Antibiotics:  None  HPI/Subjective: Awake, conversant, in nad. Complains of continued back pain  Objective: Filed Vitals:   01/02/15 0837 01/02/15 0912 01/02/15 1050 01/02/15 1335  BP:  115/62 131/66   Pulse:      Temp:    98.1 F (36.7 C)  TempSrc:    Oral  Resp:      Height:      Weight:      SpO2: 94%       Intake/Output Summary (Last 24 hours) at 01/02/15 1433 Last data filed at 01/02/15 1200  Gross per 24 hour  Intake    190 ml  Output      0 ml  Net    190 ml   Filed  Weights   12/31/14 0500 01/01/15 0500 01/02/15 0300  Weight: 63.5 kg (139 lb 15.9 oz) 64 kg (141 lb 1.5 oz) 64.592 kg (142 lb 6.4 oz)    Exam:   General:  Awake, conversant, in nad  Cardiovascular: regular, s1, s2  Respiratory: normal resp effort, no wheezing  Abdomen: soft, nondistended  Musculoskeletal: perfused, no clubbing   Data Reviewed: Basic Metabolic Panel:  Recent Labs Lab 12/27/14 1557 12/28/14 0240 12/28/14 2114 01/01/15 0419 01/02/15 0353  NA 133* 135 133* 134* 136  K 4.1 3.9 4.1 3.0* 3.8  CL 102 103 99 101 103  CO2 19 24 23 24 29   GLUCOSE 87 111* 110* 99 88  BUN 12 8 9 6 9   CREATININE 0.94 1.05 0.95 0.91 1.01  CALCIUM 9.1 8.8 9.0 8.9 8.9  MG  --  1.5 2.8*  --   --   PHOS  --  4.0  --   --   --    Liver Function Tests:  Recent Labs Lab 12/27/14 1557 12/28/14 0240  AST 33 26  ALT 13 10  ALKPHOS 136* 125*  BILITOT 0.9 0.8  PROT 7.5 6.2  ALBUMIN 3.9 3.1*   No results for input(s): LIPASE, AMYLASE in the last 168 hours. No results for input(s): AMMONIA in the last 168 hours. CBC:  Recent Labs Lab 12/27/14 1557 12/28/14 0240 01/01/15 0419 01/02/15 0353  WBC 8.4 8.2 5.7 5.6  HGB 14.7 13.0 12.7 11.8*  HCT 43.6 38.9 37.6  35.8*  MCV 87.4 87.0 86.4 88.2  PLT 332 283 255 237   Cardiac Enzymes:  Recent Labs Lab 12/27/14 1557  CKTOTAL 87  CKMB 4.1*   BNP (last 3 results) No results for input(s): BNP in the last 8760 hours.  ProBNP (last 3 results) No results for input(s): PROBNP in the last 8760 hours.  CBG:  Recent Labs Lab 12/27/14 1617 12/28/14 0014 12/28/14 0103  GLUCAP 88 104* 105*    Recent Results (from the past 240 hour(s))  Clostridium Difficile by PCR     Status: None   Collection Time: 12/27/14  5:18 PM  Result Value Ref Range Status   C difficile by pcr NEGATIVE NEGATIVE Final  MRSA PCR Screening     Status: None   Collection Time: 12/28/14  3:30 AM  Result Value Ref Range Status   MRSA by PCR NEGATIVE  NEGATIVE Final    Comment:        The GeneXpert MRSA Assay (FDA approved for NASAL specimens only), is one component of a comprehensive MRSA colonization surveillance program. It is not intended to diagnose MRSA infection nor to guide or monitor treatment for MRSA infections.      Studies: Ct Head Wo Contrast  01/01/2015   CLINICAL DATA:  59 year old female with abnormal gaze. Confusion. Dyslipidemia. Subsequent encounter.  EXAM: CT HEAD WITHOUT CONTRAST  TECHNIQUE: Contiguous axial images were obtained from the base of the skull through the vertex without intravenous contrast.  COMPARISON:  12/31/2014 MR.  FINDINGS: No CT evidence of large acute infarct.  No intracranial hemorrhage.  Mild small vessel disease type changes.  No hydrocephalus.  No intracranial mass lesion noted on this unenhanced exam.  Vascular calcifications.  Exophthalmos.  Partial opacification mastoid air cells greater on the left. No obstructing lesion of eustachian to detected.  IMPRESSION: No CT evidence of large acute infarct or evidence of intracranial hemorrhage.  Mild small vessel disease type changes.  Exophthalmos.  Left greater right mastoid air cell partial opacification.  Vascular calcifications.   Electronically Signed   By: Lacy Duverney M.D.   On: 01/01/2015 17:00   Mr Brain Wo Contrast  12/31/2014   CLINICAL DATA:  Encephalopathy and confusion. Suspected overdose. Initial encounter.  EXAM: MRI HEAD WITHOUT CONTRAST  TECHNIQUE: Multiplanar, multiecho pulse sequences of the brain and surrounding structures were obtained without intravenous contrast.  COMPARISON:  CT head 12/27/2014.  FINDINGS: The patient was unable to remain motionless for the exam. Small or subtle lesions could be overlooked.  No evidence for acute infarction, hemorrhage, mass lesion, hydrocephalus, or extra-axial fluid. Premature for age cerebral and cerebellar atrophy. Mild subcortical and periventricular T2 and FLAIR hyperintensities, likely  chronic microvascular ischemic change.  Flow voids are maintained throughout the carotid, basilar, and vertebral arteries. LEFT vertebral dominant. There are no areas of chronic hemorrhage. Scattered chronic lacunar infarcts. BILATERAL cataract extraction. No sinus disease. BILATERAL mastoid effusions, likely non worrisome. Scalp soft tissues unremarkable.  IMPRESSION: Motion degraded exam demonstrating premature atrophy, but no acute intracranial findings.   Electronically Signed   By: Davonna Belling M.D.   On: 12/31/2014 19:08    Scheduled Meds: . amLODipine  10 mg Oral Daily  . antiseptic oral rinse  15 mL Mouth Rinse 6 times per day  . cloNIDine  0.1 mg Oral QID   Followed by  . [START ON 01/03/2015] cloNIDine  0.1 mg Oral BH-qamhs   Followed by  . [START ON 01/06/2015] cloNIDine  0.1 mg Oral  QAC breakfast  . famotidine  20 mg Oral Daily  . heparin  5,000 Units Subcutaneous 3 times per day  . hydrALAZINE  25 mg Oral 3 times per day  . ipratropium-albuterol  3 mL Nebulization BID  . lisinopril  20 mg Oral Daily   Continuous Infusions: . 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 If 7PM-7AM, please contact night-coverage at [email or web address], password [facility] 01/02/2015, 2:33 PM  LOS: 6 days

## 2015-01-02 NOTE — Progress Notes (Signed)
Patient trans in from [facility] with the above diagnosis. On arrival, she is confused and yelling out. She is alert to self and kind of know she is in the hospital. She came in with restraints. Patient educated on the need to call the staff for assistance when the need arises. Oriented to the room, bed alarm set off and placed on cardiac monitor. Will continue to monitor.

## 2015-01-02 NOTE — Consult Note (Signed)
Psychiatry Consult follow-up  Reason for Consult:  AMS, depression and overdose Referring Physician:  Dr. 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 Altered mental status [R41.82]   . Hypertensive urgency [I10]   . Acute encephalopathy [G93.40] 12/29/2014  . Chronic back pain [M54.9, G89.29] 12/29/2014  . 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 Hypertensive emergency [I10] 12/27/2014  . Smoker [Z72.0] 05/09/2014  . S/P AVR (aortic valve replacement) [Z95.4] 02/01/2013  . Hypertension [I10] 01/01/2013  . Itching [L29.9] 05/10/2012  . Dizziness [R42] 04/09/2012  . CAD (coronary artery disease) [I25.10] 04/09/2012  . Tachycardia [R00.0] 01/05/2012  . Aortic valve stenosis [I35.0] 11/18/2011  . Chronic chest wall pain [R07.89, G89.29] 11/18/2011  . Shortness of breath [R06.02] 11/18/2011  . Back pain [M54.9] 11/18/2011    Total Time spent with patient: 20 minutes  Subjective:   [name] is a 59 y.o. female patient admitted with overdose of medication.  HPI: [name] is a 59 years old female seen and chart reviewed for psychiatric consultation regarding opiate overdose. Patient is awake, alert oriented to her first name last name name of the hospital but not oriented to conditions brought her to the hospital. Patient does not remember being combative on admission. Patient reported she has been taking 1 tablet of pain medication daily and she was taken 3 pills on day of admission. Patient urine drug screen is positive for opiates. Patient does not remember how she was ended up coming to the hospital. Patient was reported chronic pain syndrome and also become combative at the time of admission. Patient denied symptoms of depression, anxiety, mania, psychosis  and denied current current suicidal/homicidal ideation, intention or plans. Patient has denied alcoholism and drug of abuse. Patient reported she lives with her son was 44 old in [place], [place]. Will ask psychiatric social service to contact her family members regarding collateral information.   01/02/2015  Interval history: Patient has been calm and cooperative during this evaluation. Patient is very pleasant and watching television and able to understand the story. Patient stated she used to see this television serial and liked it in the past. Patient endorses addicted to pain medications and taking more than prescribed to her and also dating from from neighbor. Patient understands she does not need pain medication any longer and need to be enjoying her life back. Patient understands her daughter and agree with out-of-home placement. Patient has no reported confusion, irritability, agitation or aggressive behaviors for the last 24 hours.  Patient denies current symptoms of depression, anxiety, psychosis. Patient has no suicidal, homicidal ideation, intention or plans. Patient has  no significant opioid withdrawal symptoms at this time. Patient has no history of mental illness or psychiatric outpatient services. Psychiatric consultation service will sign off at this time. Patient risperidone at changed to as needed basis for hallucinations and combative behaviors.   Medical history: Patient cannot give history right now, history taken from her daughter bedside. This is a 13F with multiple medical history, who apparently also has chronic pain on her back and leg. Apparently about 2 days ago she started becoming combative and had some headache. No fever, no chills, no chest pain, no vision changes, no cough, no N/V, no abd pain, no diarrhea, no syncope. No seizures. According to her daughter, usually when  she acts like this it means she is taking too much of her pain medications and she also sometimes  take medications from other people for pain. Her daughter did a pill count and she is missing a lot of gabapentin and some levothyroxine pills. Also maybe a few pills of opiates. Also she has not been taking her HTN meds for at least 4 days. CT head no acute findings. She was very combative in ED needing some antipsychotic and also very hypertensive needing cardene drip. Urine positive only for opiates. Serum TSH normal, FT4 only slightly elevated at 1.9  PAST MEDICAL HISTORY : Patient has a past medical history of Smoking history; Aortic valve stenosis; Back pain, chronic; Chest pain; SOB (shortness of breath); Coronary artery disease; CHF (congestive heart failure); Dyslipidemia; Thyroid disease; Barrett esophagus; GERD (gastroesophageal reflux disease); PUD (peptic ulcer disease); Depression; Deaf; Sinus congestion; Wheezing; Rash, skin; Seizures; Hypothyroidism; Hypertension; Arthritis; and PONV (postoperative nausea and vomiting). Patient has past surgical history that includes Cholecystectomy; Cesarean section; Vaginal hysterectomy; Tonsillectomy and adenoidectomy; Aortic valve replacement (02/20/2012); and Cardiac catheterization (4/2011l;12/2011).   Past Medical History:  Past Medical History  Diagnosis Date  . Smoking history   . Aortic valve stenosis   . Back pain, chronic   . Chest pain   . SOB (shortness of breath)   . Coronary artery disease     mild; non-hemodynamically significant  . CHF (congestive heart failure)   . Dyslipidemia   . Thyroid disease     hypothyroidism  . Barrett esophagus   . GERD (gastroesophageal reflux disease)     with PUD  . PUD (peptic ulcer disease)   . Depression   . Deaf   . Sinus congestion   . Wheezing   . Rash, skin     2 weeks ago, bilateral hand rash, peeling, eruptions   . Seizures     as a child at age 34  . Hypothyroidism   . Hypertension   . Arthritis     back, hips, legs  . PONV (postoperative nausea and vomiting)     Past  Surgical History  Procedure Laterality Date  . Cholecystectomy    . Cesarean section      x 2  . Vaginal hysterectomy    . Tonsillectomy and adenoidectomy      at age 37  . Aortic valve replacement  02/20/2012    Procedure: AORTIC VALVE REPLACEMENT (AVR);  Surgeon: Melrose Nakayama, MD;  Location: [place];  Service: Open Heart Surgery;  Laterality: N/A;  . Cardiac catheterization  4/2011l;12/2011   Family History:  Family History  Problem Relation Age of Onset  . Heart attack Father   . Heart disease Neg Hx     heart valve problems in other family members  . Dementia Mother   . Hypertension Mother    Social History:  History  Alcohol Use No     History  Drug Use No    History   Social History  . Marital Status: Divorced    Spouse Name: N/A  . Number of Children: N/A  . Years of Education: N/A   Occupational History  . disabled     due to her nerves, chronic back pain and migrain headaches   Social History Main Topics  . Smoking status: Current Every Day Smoker -- 0.50 packs/day for 30 years    Types: Cigarettes  . Smokeless tobacco: Not on file  . Alcohol Use: No  . Drug Use:  No  . Sexual Activity: Not on file   Other Topics Concern  . None   Social History Narrative   Lives at home and cares for her mother who has dementia and also cares for her disabled son as well.      Smokes 1 ppd x 30 yrs      Pt is deaf            Additional Social History:                          Allergies:   Allergies  Allergen Reactions  . Chlorine     Bleach, rash hands  . Other     CHG hand soap - rash    Labs:  Results for orders placed or performed during the hospital encounter of 12/27/14 (from the past 48 hour(s))  CBC     Status: Abnormal   Collection Time: 01/01/15  4:19 AM  Result Value Ref Range   WBC 5.7 4.0 - 10.5 K/uL   RBC 4.35 3.87 - 5.11 MIL/uL   Hemoglobin 12.7 12.0 - 15.0 g/dL   HCT 37.6 36.0 - 46.0 %   MCV 86.4 78.0 - 100.0 fL    MCH 29.2 26.0 - 34.0 pg   MCHC 33.8 30.0 - 36.0 g/dL   RDW 15.9 (H) 11.5 - 15.5 %   Platelets 255 150 - 400 K/uL  Basic metabolic panel     Status: Abnormal   Collection Time: 01/01/15  4:19 AM  Result Value Ref Range   Sodium 134 (L) 135 - 145 mmol/L   Potassium 3.0 (L) 3.5 - 5.1 mmol/L   Chloride 101 96 - 112 mmol/L   CO2 24 19 - 32 mmol/L   Glucose, Bld 99 70 - 99 mg/dL   BUN 6 6 - 23 mg/dL   Creatinine, Ser 0.91 0.50 - 1.10 mg/dL   Calcium 8.9 8.4 - 10.5 mg/dL   GFR calc non Af Amer 68 (L) >90 mL/min   GFR calc Af Amer 78 (L) >90 mL/min    Comment: (NOTE) The eGFR has been calculated using the CKD EPI equation. This calculation has not been validated in all clinical situations. eGFR's persistently <90 mL/min signify possible Chronic Kidney Disease.    Anion gap 9 5 - 15  Blood gas, arterial     Status: Abnormal   Collection Time: 01/01/15  8:58 AM  Result Value Ref Range   O2 Content 2.0 L/min   Delivery systems NASAL CANNULA    pH, Arterial 7.430 7.350 - 7.450   pCO2 arterial 35.9 35.0 - 45.0 mmHg   pO2, Arterial 134.0 (H) 80.0 - 100.0 mmHg   Bicarbonate 23.5 20.0 - 24.0 mEq/L   TCO2 24.6 0 - 100 mmol/L   Acid-base deficit 0.3 0.0 - 2.0 mmol/L   O2 Saturation 98.9 %   Patient temperature 98.6    Collection site RIGHT RADIAL    Drawn by [id number]    Sample type ARTERIAL DRAW    Allens test (pass/fail) PASS PASS  Basic metabolic panel     Status: Abnormal   Collection Time: 01/02/15  3:53 AM  Result Value Ref Range   Sodium 136 135 - 145 mmol/L   Potassium 3.8 3.5 - 5.1 mmol/L    Comment: DELTA CHECK NOTED   Chloride 103 96 - 112 mmol/L   CO2 29 19 - 32 mmol/L   Glucose, Bld 88 70 - 99 mg/dL  BUN 9 6 - 23 mg/dL   Creatinine, Ser 1.01 0.50 - 1.10 mg/dL   Calcium 8.9 8.4 - 10.5 mg/dL   GFR calc non Af Amer 60 (L) >90 mL/min   GFR calc Af Amer 69 (L) >90 mL/min    Comment: (NOTE) The eGFR has been calculated using the CKD EPI equation. This calculation has not  been validated in all clinical situations. eGFR's persistently <90 mL/min signify possible Chronic Kidney Disease.    Anion gap 4 (L) 5 - 15  CBC     Status: Abnormal   Collection Time: 01/02/15  3:53 AM  Result Value Ref Range   WBC 5.6 4.0 - 10.5 K/uL   RBC 4.06 3.87 - 5.11 MIL/uL   Hemoglobin 11.8 (L) 12.0 - 15.0 g/dL   HCT 35.8 (L) 36.0 - 46.0 %   MCV 88.2 78.0 - 100.0 fL   MCH 29.1 26.0 - 34.0 pg   MCHC 33.0 30.0 - 36.0 g/dL   RDW 16.2 (H) 11.5 - 15.5 %   Platelets 237 150 - 400 K/uL    Vitals: Blood pressure 115/62, pulse 75, temperature 96.7 F (35.9 C), temperature source Axillary, resp. rate 18, height 5' (1.524 m), weight 64.592 kg (142 lb 6.4 oz), SpO2 94 %.  Risk to Self: Is patient at risk for suicide?: No Risk to Others:   Prior Inpatient Therapy:   Prior Outpatient Therapy:    Current Facility-Administered Medications  Medication Dose Route Frequency Provider Last Rate Last Dose  . 0.9 %  sodium chloride infusion   Intravenous Continuous Raylene Miyamoto, MD 10 mL/hr at 01/02/15 0505    . acetaminophen (TYLENOL) tablet 650 mg  650 mg Oral Q4H PRN Alric Quan, MD      . amLODipine (NORVASC) tablet 10 mg  10 mg Oral Daily Alric Quan, MD   10 mg at 01/01/15 3112  . antiseptic oral rinse (BIOTENE) solution 15 mL  15 mL Mouth Rinse 6 times per day Raylene Miyamoto, MD   15 mL at 01/02/15 0912  . cloNIDine (CATAPRES) tablet 0.1 mg  0.1 mg Oral QID Donne Hazel, MD   0.1 mg at 01/01/15 1712   Followed by  . [START ON 01/03/2015] cloNIDine (CATAPRES) tablet 0.1 mg  0.1 mg Oral BH-qamhs Donne Hazel, MD       Followed by  . [START ON 01/06/2015] cloNIDine (CATAPRES) tablet 0.1 mg  0.1 mg Oral QAC breakfast Donne Hazel, MD      . dicyclomine (BENTYL) tablet 20 mg  20 mg Oral Q6H PRN Donne Hazel, MD      . famotidine (PEPCID) tablet 20 mg  20 mg Oral Daily Domenic Polite, MD   20 mg at 01/01/15 1624  . heparin injection 5,000 Units  5,000 Units  Subcutaneous 3 times per day Alric Quan, MD   5,000 Units at 01/02/15 0516  . hydrALAZINE (APRESOLINE) tablet 25 mg  25 mg Oral 3 times per day Domenic Polite, MD   25 mg at 01/02/15 0912  . hydrOXYzine (ATARAX/VISTARIL) tablet 25 mg  25 mg Oral Q6H PRN Donne Hazel, MD   25 mg at 01/01/15 1427  . ipratropium-albuterol (DUONEB) 0.5-2.5 (3) MG/3ML nebulizer solution 3 mL  3 mL Nebulization BID Domenic Polite, MD   3 mL at 01/02/15 0835  . labetalol (NORMODYNE,TRANDATE) injection 10 mg  10 mg Intravenous Q2H PRN Domenic Polite, MD      . lisinopril (PRINIVIL,ZESTRIL) tablet 20  mg  20 mg Oral Daily Alric Quan, MD   20 mg at 01/01/15 4782  . loperamide (IMODIUM) capsule 2-4 mg  2-4 mg Oral PRN Donne Hazel, MD      . LORazepam (ATIVAN) injection 2 mg  2 mg Intravenous PRN Donne Hazel, MD   2 mg at 12/31/14 1744  . methocarbamol (ROBAXIN) tablet 500 mg  500 mg Oral Q8H PRN Donne Hazel, MD   500 mg at 01/01/15 1427  . naproxen (NAPROSYN) tablet 500 mg  500 mg Oral BID PRN Donne Hazel, MD   500 mg at 01/01/15 1426  . ondansetron (ZOFRAN) injection 4 mg  4 mg Intravenous Q6H PRN Alric Quan, MD      . ondansetron (ZOFRAN-ODT) disintegrating tablet 4 mg  4 mg Oral Q6H PRN Donne Hazel, MD   4 mg at 01/01/15 1427  . risperiDONE (RISPERDAL) tablet 0.5 mg  0.5 mg Oral QHS Ambrose Finland, MD   0.5 mg at 01/01/15 0000    Musculoskeletal: Strength & Muscle Tone: decreased Gait & Station: unable to stand Patient leans: N/A  Psychiatric Specialty Exam: Physical Exam as per history and physical   ROS chronic pain, somewhat confused and combative on admission   Blood pressure 115/62, pulse 75, temperature 96.7 F (35.9 C), temperature source Axillary, resp. rate 18, height 5' (1.524 m), weight 64.592 kg (142 lb 6.4 oz), SpO2 94 %.Body mass index is 27.81 kg/(m^2).  General Appearance: Guarded  Eye Contact::  Good  Speech:  Clear and Coherent  Volume:  Normal   Mood:  Anxious and Depressed  Affect:  Appropriate and Congruent  Thought Process:  Coherent and Loose  Orientation:  Full (Time, Place, and Person)  Thought Content:  WDL  Suicidal Thoughts:  No  Homicidal Thoughts:  No  Memory:  Immediate;   Fair Recent;   Poor  Judgement:  Impaired  Insight:  Shallow  Psychomotor Activity:  Decreased  Concentration:  Fair  Recall:  Poor  Fund of Knowledge:Fair  Language: Good  Akathisia:  Negative  Handed:  Right  AIMS (if indicated):     Assets:  Communication Skills Desire for Improvement Financial Resources/Insurance Housing Intimacy Leisure Time Resilience Social Support  ADL's:  Impaired  Cognition: Impaired,  Mild  Sleep:      Medical Decision Making: New problem, with additional work up planned, Review of Psycho-Social Stressors (1), Review or order clinical lab tests (1), Review of Last Therapy Session (1), Review or order medicine tests (1), Review of Medication Regimen & Side Effects (2) and Review of New Medication or Change in Dosage (2)  Treatment Plan Summary: Daily contact with patient to assess and evaluate symptoms and progress in treatment and Medication management  Plan: Continue Risperidone 0.5 mg PO Qhs  when necessary for agitation and aggression  Patient does not meet criteria for psychiatric inpatient admission. Supportive therapy provided about ongoing stressors. Appreciate psychiatric consultation awe sign off at this time Please contact [phone] or [phone] if needs further assistance  Disposition: Patient benefit from out-of-home placement and patient and her daughter are quite no pain medication management.   Iniko Robles,JANARDHAHA R. 01/02/2015 10:20 AM

## 2015-01-02 NOTE — Progress Notes (Signed)
Occupational Therapy Treatment Patient Details Name: Abigail PoeDonna S Heft MRN: [id number] DOB: 1956-09-30 Today's Date: 01/02/2015    History of present illness patient is a 59 yo female who presents with Encephalopathy/confusion in setting of possible accidental opiate overdose, vs hypertensive encephalopathy.   OT comments  Pt with improved cognition today. Able to follow one step simple commands and attend for 1-2 minutes to ADL.  Self fed crackers and drink and performed grooming in sitting. Periods of emotional lability noted.  Easily redirected.  Follow Up Recommendations  SNF;Supervision/Assistance - 24 hour    Equipment Recommendations       Recommendations for Other Services      Precautions / Restrictions Precautions Precautions: Fall Precaution Comments: AMS, can be agitated       Mobility Bed Mobility   Bed Mobility: Supine to Sit;Sit to Supine     Supine to sit: Min assist Sit to supine: Min guard      Transfers                      Balance     Sitting balance-Leahy Scale: Fair                             ADL Overall ADL's : Needs assistance/impaired Eating/Feeding: Set up;Sitting Eating/Feeding Details (indicate cue type and reason): ate graham crackers and drank Coke, had refused breakfast Grooming: Wash/dry hands;Wash/dry face;Supervision/safety;Set up;Sitting;Brushing hair           Upper Body Dressing : Moderate assistance;Sitting   Lower Body Dressing: Minimal assistance;Sitting/lateral leans Lower Body Dressing Details (indicate cue type and reason): donned socks                General ADL Comments: Pt able to answer questions and contribute to conversation about her affinity for [employer] and her family.  Pt distracted by desire for her family to come up to the hospital.      Vision                     Perception     Praxis      Cognition   Behavior During Therapy:  (labile) Overall Cognitive Status:  Impaired/Different from baseline Area of Impairment: Orientation;Attention;Memory;Following commands;Safety/judgement;Awareness;Problem solving Orientation Level: Disoriented to;Place;Time;Situation Current Attention Level: Sustained Memory: Decreased short-term memory  Following Commands: Follows one step commands with increased time Safety/Judgement: Decreased awareness of safety;Decreased awareness of deficits   Problem Solving: Slow processing;Decreased initiation;Difficulty sequencing;Requires verbal cues;Requires tactile cues General Comments: improvement noted in attention and ability to follow simple commands    Extremity/Trunk Assessment               Exercises     Shoulder Instructions       General Comments      Pertinent Vitals/ Pain       Pain Assessment: No/denies pain  Home Living                                          Prior Functioning/Environment              Frequency Min 2X/week     Progress Toward Goals  OT Goals(current goals can now be found in the care plan section)  Progress towards OT goals: Progressing toward goals  Acute Rehab OT Goals Time For Goal  Achievement: 01/14/15 Potential to Achieve Goals: Good  Plan Discharge plan remains appropriate    Co-evaluation                 End of Session     Activity Tolerance Patient tolerated treatment well   Patient Left in bed;with call bell/phone within reach;with bed alarm set;with restraints reapplied;with nursing/sitter in room   Nurse Communication  (ok to give Coke)        Time: 1610-9604 OT Time Calculation (min): 39 min  Charges: OT General Charges $OT Visit: 1 Procedure OT Treatments $Self Care/Home Management : 38-52 mins  Evern Bio 01/02/2015, 10:07 AM  [phone]

## 2015-01-02 NOTE — Progress Notes (Signed)
Report called to LyonsEunice, RN on 5W.  Updated on patient history, current status, and plan of care.  Patient is stable and able to transfer at this time.

## 2015-01-02 NOTE — Clinical Social Work Psychosocial (Signed)
Clinical Social Work Department BRIEF PSYCHOSOCIAL ASSESSMENT 01/02/2015  Patient:  [name],[name]     Account Number:  [id number]     Admit date:  12/27/2014  Clinical Social Worker:  Merlyn LotHOLOMAN,Morganna Styles, CLINICAL SOCIAL WORKER  Date/Time:  01/02/2015 04:36 PM  Referred by:  Physician  Date Referred:  01/02/2015 Referred for  SNF Placement   Other Referral:   Interview type:  Family Other interview type:    PSYCHOSOCIAL DATA Living Status:  FAMILY Admitted from facility:   Level of care:   Primary support name:  [name] Primary support relationship to patient:  CHILD, ADULT Degree of support available:   high level of support from daughter but pt normally lives at home with her son who is on disability for mental health issues and is unable to properly supervise pt    CURRENT CONCERNS Current Concerns  Post-Acute Placement   Other Concerns:    SOCIAL WORK ASSESSMENT / PLAN CSW spoke with pt daughter, [name], who states that the pt ODed probably unintentionally on a large amount of various medications- pt was on a large amount of pain medication at [place] [facility] [place] and dtr believes she is addicted.    Patient dtr is agreeable to SNF placement and states that the pt can not return home in the medical state that she is currently in.   Assessment/plan status:  Psychosocial Support/Ongoing Assessment of Needs Other assessment/ plan:   FL2   Information/referral to community resources:    PATIENT'[name]/FAMILY'[name] RESPONSE TO PLAN OF CARE: Patients daughter is overwhelmed by her mothers status right now and is scared she will never return to normal.       Merlyn LotJenna Holoman, [facility]WA Clinical Social Worker [phone]

## 2015-01-02 NOTE — Progress Notes (Signed)
Pt has order for non-violent restraints. Pt did not have restraints on at the start of my shift at 1900 on 01/02/15. Will continue to assess for need of restraints and obtain new order from physician as necessary.

## 2015-01-02 NOTE — Clinical Social Work Placement (Signed)
Clinical Social Work Department CLINICAL SOCIAL WORK PLACEMENT NOTE 01/02/2015  Patient:  [name],[name]  Account Number:  [id number] Admit date:  12/27/2014  Clinical Social Worker:  Merlyn LotJENNA HOLOMAN, CLINICAL SOCIAL WORKER  Date/time:  01/02/2015 04:39 PM  Clinical Social Work is seeking post-discharge placement for this patient at the following level of care:   SKILLED NURSING   (*CSW will update this form in Epic as items are completed)   01/02/2015  [facility]family provided with [facility] Department of Clinical Social Work'[name] list of facilities offering this level of care within the geographic area requested by the patient (or if unable, by the patient'[name] family).  01/02/2015  Patient/family informed of their freedom to choose among providers that offer the needed level of care, that participate in Medicare, Medicaid or managed care program needed by the patient, have an available bed and are willing to accept the patient.  01/02/2015  Patient/family informed of [facility]' ownership interest in [facility], as well as of the fact that they are under no obligation to receive care at this facility.  PASARR submitted to EDS on  PASARR number received on   FL2 transmitted to all facilities in geographic area requested by pt/family on  01/02/2015 FL2 transmitted to all facilities within larger geographic area on   Patient informed that his/her managed care company has contracts with or will negotiate with  certain facilities, including the following:     Patient/family informed of bed offers received:   Patient chooses bed at  Physician recommends and patient chooses bed at    Patient to be transferred to  on   Patient to be transferred to facility by  Patient and family notified of transfer on  Name of family member notified:    The following physician request were entered in Epic:   Additional Comments: Merlyn LotJenna Holoman, [facility]SWA Clinical Social Worker [phone]

## 2015-01-03 MED ORDER — LORAZEPAM 2 MG/ML IJ SOLN
1.0000 mg | Freq: Once | INTRAMUSCULAR | Status: AC
  Administered 2015-01-03: 1 mg via INTRAVENOUS
  Filled 2015-01-03: qty 1

## 2015-01-03 NOTE — Progress Notes (Signed)
New order received and implemented to initiate restraints due to interference with healthcare by removing necessary equipment as well as pulling at IV. Pt is also combative, attempting to hit sitter. Will continue to monitor.

## 2015-01-03 NOTE — Progress Notes (Signed)
[facility] PROGRESS NOTE  Sherle PoeDonna S Milstein ZOX:[id number] DOB: 1955-12-30 DOA: 12/27/2014 PCP: Sherrie MustacheJADALI,FAYEGH, MD  Assessment/Plan: 1. Encephalopathy/confusion -etiology suspected to be from OD (gabapentin/narcotics) vs hypertensive encephalopathy -mental status had been improving but pt continues to be confused. Can this be new baseline? -Utox on admit positives for opiates only -CT head unremarkable, no evidence of infectious process -Psych consulted, daughter suspects narcotic abuse, may be getting pills from neighbor too - Discussed with daughter over phone. Family has found empty bottles of muscle relaxant and pain pills at home -sitter remains at bedside -MRI brain unremarkable. EEG brain with non-specific slowing  2. Accelerated HTN -Initially required labetalol gtt, now off -BP remains stable -Pt is continued on lisinopril, PO hydralazine, and amlodipine  3. H/o AVR -Stable  4. Chronic back pain - Currently off narcotics per above - Have ordered clonidine protocol for opioid withdrawal - Discussed with pt's daughter, who is a CNA, over the phone. Daughter is concerned about opioid addiction per above. When daughter confronted patient about cutting off narcotics, pt stated she will go back to pharmacy for refills.  5. Hyperthyroidism -TSH normal, free T4 mildly up  Code Status: Full Family Communication: Pt in room, daughter over phone Disposition Plan: Pending   Consultants:  Psychiatry  Procedures:    Antibiotics:  None  HPI/Subjective: Awake, conversant, not fully oriented. Upset about wanting pain meds  Objective: Filed Vitals:   01/02/15 1654 01/02/15 2202 01/03/15 0409 01/03/15 0559  BP: 111/59 118/63  102/62  Pulse:  68  67  Temp: 98.5 F (36.9 C) 98.4 F (36.9 C)  98.5 F (36.9 C)  TempSrc: Oral Oral  Oral  Resp: 20 20  18   Height:      Weight: 65.635 kg (144 lb 11.2 oz)  63.277 kg (139 lb 8 oz)   SpO2: 91% 99%  100%     Intake/Output Summary (Last 24 hours) at 01/03/15 1526 Last data filed at 01/03/15 1335  Gross per 24 hour  Intake  772.5 ml  Output      0 ml  Net  772.5 ml   Filed Weights   01/02/15 0300 01/02/15 1654 01/03/15 0409  Weight: 64.592 kg (142 lb 6.4 oz) 65.635 kg (144 lb 11.2 oz) 63.277 kg (139 lb 8 oz)    Exam:   General:  Awake, conversant, in nad  Cardiovascular: regular, s1, s2  Respiratory: normal resp effort, no wheezing  Abdomen: soft, nondistended  Musculoskeletal: perfused, no clubbing   Data Reviewed: Basic Metabolic Panel:  Recent Labs Lab 12/27/14 1557 12/28/14 0240 12/28/14 2114 01/01/15 0419 01/02/15 0353  NA 133* 135 133* 134* 136  K 4.1 3.9 4.1 3.0* 3.8  CL 102 103 99 101 103  CO2 19 24 23 24 29   GLUCOSE 87 111* 110* 99 88  BUN 12 8 9 6 9   CREATININE 0.94 1.05 0.95 0.91 1.01  CALCIUM 9.1 8.8 9.0 8.9 8.9  MG  --  1.5 2.8*  --   --   PHOS  --  4.0  --   --   --    Liver Function Tests:  Recent Labs Lab 12/27/14 1557 12/28/14 0240  AST 33 26  ALT 13 10  ALKPHOS 136* 125*  BILITOT 0.9 0.8  PROT 7.5 6.2  ALBUMIN 3.9 3.1*   No results for input(s): LIPASE, AMYLASE in the last 168 hours. No results for input(s): AMMONIA in the last 168 hours. CBC:  Recent Labs Lab 12/27/14 1557 12/28/14 0240  01/01/15 0419 01/02/15 0353  WBC 8.4 8.2 5.7 5.6  HGB 14.7 13.0 12.7 11.8*  HCT 43.6 38.9 37.6 35.8*  MCV 87.4 87.0 86.4 88.2  PLT 332 283 255 237   Cardiac Enzymes:  Recent Labs Lab 12/27/14 1557  CKTOTAL 87  CKMB 4.1*   BNP (last 3 results) No results for input(s): BNP in the last 8760 hours.  ProBNP (last 3 results) No results for input(s): PROBNP in the last 8760 hours.  CBG:  Recent Labs Lab 12/27/14 1617 12/28/14 0014 12/28/14 0103  GLUCAP 88 104* 105*    Recent Results (from the past 240 hour(s))  Clostridium Difficile by PCR     Status: None   Collection Time: 12/27/14  5:18 PM  Result Value Ref Range  Status   C difficile by pcr NEGATIVE NEGATIVE Final  MRSA PCR Screening     Status: None   Collection Time: 12/28/14  3:30 AM  Result Value Ref Range Status   MRSA by PCR NEGATIVE NEGATIVE Final    Comment:        The GeneXpert MRSA Assay (FDA approved for NASAL specimens only), is one component of a comprehensive MRSA colonization surveillance program. It is not intended to diagnose MRSA infection nor to guide or monitor treatment for MRSA infections.      Studies: Ct Head Wo Contrast  01/01/2015   CLINICAL DATA:  59 year old female with abnormal gaze. Confusion. Dyslipidemia. Subsequent encounter.  EXAM: CT HEAD WITHOUT CONTRAST  TECHNIQUE: Contiguous axial images were obtained from the base of the skull through the vertex without intravenous contrast.  COMPARISON:  12/31/2014 MR.  FINDINGS: No CT evidence of large acute infarct.  No intracranial hemorrhage.  Mild small vessel disease type changes.  No hydrocephalus.  No intracranial mass lesion noted on this unenhanced exam.  Vascular calcifications.  Exophthalmos.  Partial opacification mastoid air cells greater on the left. No obstructing lesion of eustachian to detected.  IMPRESSION: No CT evidence of large acute infarct or evidence of intracranial hemorrhage.  Mild small vessel disease type changes.  Exophthalmos.  Left greater right mastoid air cell partial opacification.  Vascular calcifications.   Electronically Signed   By: Lacy Duverney M.D.   On: 01/01/2015 17:00    Scheduled Meds: . amLODipine  10 mg Oral Daily  . antiseptic oral rinse  15 mL Mouth Rinse 6 times per day  . cloNIDine  0.1 mg Oral BH-qamhs   Followed by  . [START ON 01/06/2015] cloNIDine  0.1 mg Oral QAC breakfast  . famotidine  20 mg Oral Daily  . heparin  5,000 Units Subcutaneous 3 times per day  . hydrALAZINE  25 mg Oral 3 times per day  . ipratropium-albuterol  3 mL Nebulization BID  . lisinopril  20 mg Oral Daily   Continuous Infusions: . 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 If 7PM-7AM, please contact night-coverage at [email or web address], password [facility] 01/03/2015, 3:26 PM  LOS: 7 days
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
## 2015-01-04 DIAGNOSIS — M5441 Lumbago with sciatica, right side: Secondary | ICD-10-CM

## 2015-01-04 MED ORDER — HYDRALAZINE HCL 25 MG PO TABS
25.0000 mg | ORAL_TABLET | Freq: Three times a day (TID) | ORAL | Status: AC
Start: 2015-01-04 — End: ?

## 2015-01-04 MED ORDER — KETOROLAC TROMETHAMINE 10 MG PO TABS: 10.0000 mg | ORAL_TABLET | Freq: Four times a day (QID) | ORAL | Status: AC | PRN

## 2015-01-04 MED ORDER — RISPERIDONE 0.5 MG PO TABS: 0.5000 mg | ORAL_TABLET | Freq: Every evening | ORAL | Status: AC | PRN

## 2015-01-04 NOTE — Social Work (Signed)
Patient discharged prior to CSW being able to share bed offers.   No further CSW needs.  Beverly Sessionsywan J Ronita Hargreaves MSW, LCSW

## 2015-01-04 NOTE — Discharge Summary (Signed)
Physician Discharge Summary  [name] XBJ:[id number] DOB: [birth year] DOA: 12/27/2014  PCP: Sherrie Mustache, MD  Admit date: 12/27/2014 Discharge date: 01/04/2015  Time spent: 20 minutes  Recommendations for Outpatient Follow-up:  1. Follow up with PCP in 1-2 weeks 2. PLEASE ADDRESS PATIENT'S PRESCRIPTION DRUG ABUSE ISSUES AS AN OUTPATIENT. 3. 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 Briefly, pt presented with acute delirium, with concerns for prescription drug overdose. Patient was admitted for further work up.  Hospital Course:  1. Encephalopathy/confusion -etiology suspected to be from OD (gabapentin/narcotics) vs hypertensive encephalopathy -mental status had been improving but pt continues to be confused. Can this be new baseline? -Utox on admit positives for opiates only -CT head unremarkable, no evidence of infectious process -Psych consulted, daughter suspects narcotic abuse, may be getting pills from neighbor too - Discussed with daughter over phone. Family has found empty bottles of muscle relaxant and pain pills at home -sitter remains at bedside -MRI brain unremarkable. EEG brain with non-specific slowing  2. Accelerated HTN -Initially required labetalol gtt, now off -BP remains stable -Pt is continued on lisinopril, PO hydralazine,  and amlodipine  3. H/o AVR -Stable  4. Hx Chronic back pain - Remained off narcotics and gabapentin per above - was continued on clonidine protocol for opioid withdrawal - Discussed with pt's daughter, who is a CNA, over the phone. Daughter is concerned about opioid addiction per above. When daughter confronted patient about cutting off narcotics, pt stated she will go back to pharmacy for refills. - On day of discharge, pt was laying on her side, fully dressed, in no distress. She has been receiving naproxen for back pains while in the hospital  5. Hyperthyroidism -TSH normal, free T4 mildly up -Would follow up as outpatient  Consultations:  Psychiatry  Discharge Exam: Filed Vitals:   01/03/15 2046 01/04/15 0529 01/04/15 0558 01/04/15 0610  BP: 113/62 101/52 104/50 111/57  Pulse: 71 56  92  Temp: 98.3 F (36.8 C) 98.4 F (36.9 C)    TempSrc: Oral Oral    Resp: 20 20    Height:      Weight:  63.821 kg (140 lb 11.2 oz)    SpO2: 94% 93%      General: Awake, in nad, oriented x 3 Cardiovascular: Regular, s1, s2 Respiratory: normal resp effort, no wheezing  Discharge Instructions     Medication List    STOP taking these medications        benazepril 40 MG tablet  Commonly known as:  LOTENSIN     gabapentin 300 MG capsule  Commonly known as:  NEURONTIN     HYDROcodone-acetaminophen 10-325 MG per tablet  Commonly known as:  NORCO     traMADol 50 MG tablet  Commonly known as:  ULTRAM      TAKE these  medications        amLODipine 10 MG tablet  Commonly known as:  NORVASC  Take 1 tablet (10 mg total) by mouth daily.     beclomethasone 80 MCG/ACT inhaler  Commonly known as:  QVAR  Inhale 1 puff into the lungs 2 (two) times daily.     hydrALAZINE 25 MG tablet  Commonly known as:  APRESOLINE  Take 1 tablet (25 mg total) by mouth every 8 (eight) hours.     ketorolac 10 MG tablet  Commonly known as:  TORADOL  Take 1 tablet (10 mg total) by mouth every 6  (six) hours as needed for moderate pain or severe pain.     levothyroxine 50 MCG tablet  Commonly known as:  SYNTHROID, LEVOTHROID  Take 1 tablet (50 mcg total) by mouth daily.     lisinopril 20 MG tablet  Commonly known as:  PRINIVIL,ZESTRIL  Take 1 tablet by mouth daily.     omeprazole 20 MG capsule  Commonly known as:  PRILOSEC  Take 1 capsule (20 mg total) by mouth daily.     risperiDONE 0.5 MG tablet  Commonly known as:  RISPERDAL  Take 1 tablet (0.5 mg total) by mouth at bedtime as needed (Combative behaviors and confusion).       Allergies  Allergen Reactions  . Chlorine     Bleach, rash hands  . Other     CHG hand soap - rash      The results of significant diagnostics from this hospitalization (including imaging, microbiology, ancillary and laboratory) are listed below for reference.    Significant Diagnostic Studies: Ct Head Wo Contrast  01/01/2015   CLINICAL DATA:  59 year old female with abnormal gaze. Confusion. Dyslipidemia. Subsequent encounter.  EXAM: CT HEAD WITHOUT CONTRAST  TECHNIQUE: Contiguous axial images were obtained from the base of the skull through the vertex without intravenous contrast.  COMPARISON:  12/31/2014 MR.  FINDINGS: No CT evidence of large acute infarct.  No intracranial hemorrhage.  Mild small vessel disease type changes.  No hydrocephalus.  No intracranial mass lesion noted on this unenhanced exam.  Vascular calcifications.  Exophthalmos.  Partial opacification mastoid air cells greater on the left. No obstructing lesion of eustachian to detected.  IMPRESSION: No CT evidence of large acute infarct or evidence of intracranial hemorrhage.  Mild small vessel disease type changes.  Exophthalmos.  Left greater right mastoid air cell partial opacification.  Vascular calcifications.   Electronically Signed   By: Lacy Duverney M.D.   On: 01/01/2015 17:00   Ct Head Wo Contrast  12/27/2014   CLINICAL DATA:  Altered mental status, overdose  EXAM: CT  HEAD WITHOUT CONTRAST  TECHNIQUE: Contiguous axial images were obtained from the base of the skull through the vertex without intravenous contrast.  COMPARISON:  02/22/2014  FINDINGS: No skull fracture is noted. No intracranial hemorrhage, mass effect or midline shift.  Paranasal sinuses are unremarkable. Stable chronic opacification of left mastoid air cells. Atherosclerotic calcifications of carotid siphon again noted.  No definite acute cortical infarction. No mass lesion is noted on this unenhanced scan. Stable mild periventricular white matter decreased attenuation probable due to chronic small vessel ischemic changes. Stable subtle hypodensity bilateral frontal lobes probable due to prior small infarcts.  IMPRESSION: No acute intracranial abnormality. Mild cerebral atrophy. No definite acute cortical infarction. No mass lesion is noted on this unenhanced scan.   Electronically Signed   By: Natasha Mead M.D.   On: 12/27/2014 19:02  Mr Brain Wo Contrast  12/31/2014   CLINICAL DATA:  Encephalopathy and confusion. Suspected overdose. Initial encounter.  EXAM: MRI HEAD WITHOUT CONTRAST  TECHNIQUE: Multiplanar, multiecho pulse sequences of the brain and surrounding structures were obtained without intravenous contrast.  COMPARISON:  CT head 12/27/2014.  FINDINGS: The patient was unable to remain motionless for the exam. Small or subtle lesions could be overlooked.  No evidence for acute infarction, hemorrhage, mass lesion, hydrocephalus, or extra-axial fluid. Premature for age cerebral and cerebellar atrophy. Mild subcortical and periventricular T2 and FLAIR hyperintensities, likely chronic microvascular ischemic change.  Flow voids are maintained throughout the carotid, basilar, and vertebral arteries. LEFT vertebral dominant. There are no areas of chronic hemorrhage. Scattered chronic lacunar infarcts. BILATERAL cataract extraction. No sinus disease. BILATERAL mastoid effusions, likely non worrisome. Scalp soft  tissues unremarkable.  IMPRESSION: Motion degraded exam demonstrating premature atrophy, but no acute intracranial findings.   Electronically Signed   By: Davonna BellingJohn  Curnes M.D.   On: 12/31/2014 19:08   Dg Chest Port 1 View  12/27/2014   CLINICAL DATA:  Altered mental status  EXAM: PORTABLE CHEST - 1 VIEW  COMPARISON:  12/18/2014  FINDINGS: The heart size and mediastinal contours are within normal limits. Both lungs are clear. The visualized skeletal structures are unremarkable. Cardiac leads obscure detail. Evidence of CABG reidentified.  IMPRESSION: No active disease.   Electronically Signed   By: Christiana PellantGretchen  Green M.D.   On: 12/27/2014 16:53    Microbiology: Recent Results (from the past 240 hour(s))  Clostridium Difficile by PCR     Status: None   Collection Time: 12/27/14  5:18 PM  Result Value Ref Range Status   C difficile by pcr NEGATIVE NEGATIVE Final  MRSA PCR Screening     Status: None   Collection Time: 12/28/14  3:30 AM  Result Value Ref Range Status   MRSA by PCR NEGATIVE NEGATIVE Final    Comment:        The GeneXpert MRSA Assay (FDA approved for NASAL specimens only), is one component of a comprehensive MRSA colonization surveillance program. It is not intended to diagnose MRSA infection nor to guide or monitor treatment for MRSA infections.      Labs: Basic Metabolic Panel:  Recent Labs Lab 12/28/14 2114 01/01/15 0419 01/02/15 0353  NA 133* 134* 136  K 4.1 3.0* 3.8  CL 99 101 103  CO2 23 24 29   GLUCOSE 110* 99 88  BUN 9 6 9   CREATININE 0.95 0.91 1.01  CALCIUM 9.0 8.9 8.9  MG 2.8*  --   --    Liver Function Tests: No results for input(s): AST, ALT, ALKPHOS, BILITOT, PROT, ALBUMIN in the last 168 hours. No results for input(s): LIPASE, AMYLASE in the last 168 hours. No results for input(s): AMMONIA in the last 168 hours. CBC:  Recent Labs Lab 01/01/15 0419 01/02/15 0353  WBC 5.7 5.6  HGB 12.7 11.8*  HCT 37.6 35.8*  MCV 86.4 88.2  PLT 255 237    Cardiac Enzymes: No results for input(s): CKTOTAL, CKMB, CKMBINDEX, TROPONINI in the last 168 hours. BNP: BNP (last 3 results) No results for input(s): BNP in the last 8760 hours.  ProBNP (last 3 results) No results for input(s): PROBNP in the last 8760 hours.  CBG: No results for input(s): GLUCAP in the last 168 hours.   Signed:  CHIU, STEPHEN K  [facility] 01/04/2015, 11:45 AM

## 2015-01-04 NOTE — Progress Notes (Signed)
01/04/15 Patient being discharged home today, IV site removed. And discharge instructions reviewed with patient.

## 2015-01-24 NOTE — Consult Note (Signed)
Brief Consult Note: Diagnosis: 1. SVT/Sinus arrhythmia 2. COPD 3. hx of CAD 4. hx of Heart valve replacement 5. HTN 6. Neuropathy.   Patient was seen by consultant.   Consult note dictated.   Orders entered.   Discussed with Attending MD.   Comments: 59 yo female w/ hx of COPD w/ ongoing toboacco abuse, hx of heart valve replacement, HtN, Neuropathy, Hypothyroidism who came to the hospital for cataract surgery but intra-operatively went into SVT.  Also post op noted be in SVT/Sinus arrhythmia.    1. SVT/Sinus arrhthmia - etiology unclear.  clinically asymptomatic.   - no evidence of ACS as pt's CE X 2 have been (-).  - will d/c on oral metoprolol as needed for palpitations.   - spoke w/ Dr. Mariah MillingGollan over the phone and he will see her as outpatient in the next day or so.    2. COPD w/ ongoing tobacco abuse - no acute exacerbation.  - advised pt. to quit smoking.   3. HTN - cont. Lisinorpil  4. Hypothyroidism - cont. synthroid.   5. Neuropathy - cont. Neurontin.   Thanks for the consult and o.k. to be discharged from medical standpoint.   Job # [id number].  Electronic Signatures: [place] SirenSainani, Vivek J (MD)  (Signed 09-Sep-15 15:21)  Authored: Brief Consult Note   Last Updated: 09-Sep-15 15:21 by [place] SirenSainani, Vivek J (MD)

## 2015-01-24 NOTE — Op Note (Signed)
PATIENT NAME:  [name], Dyneshia S MR#:  [id number] DATE OF BIRTH:  [birth year]  DATE OF PROCEDURE:  06/11/2014  PREOPERATIVE DIAGNOSIS:  Nuclear sclerotic cataract left eye.  POSTOPERATIVE DIAGNOSIS:  Nuclear sclerotic cataract left eye.  PROCEDURE:  Phacoemulsification with posterior chamber intraocular lens implantation of the left eye.  LENS:  ZCBOO 26.0-diopter posterior chamber intraocular lens.  ULTRASOUND TIME:  20% of 56 seconds.  CDE 11.2.  SURGEON:  [place]had Amanee Iacovelli, MD  ANESTHESIA:  Topical with tetracaine drops and 2% Xylocaine jelly.  COMPLICATIONS:  None.  DESCRIPTION OF PROCEDURE:  The patient was identified in the holding room and transported to the operating room and placed in the supine position under the operating microscope.  The left eye was identified as the operative eye and it was prepped and draped in the usual sterile ophthalmic fashion.  A 1 millimeter clear-corneal paracentesis was made at the 1:30 position.  The anterior chamber was filled with Viscoat viscoelastic.  A 2.4 millimeter keratome was used to make a near-clear corneal incision at the 10:30 position.  A curvilinear capsulorrhexis was made with a cystotome and capsulorrhexis forceps.  Balanced salt solution was used to hydrodissect and hydrodelineate the nucleus.  Phacoemulsification was then used in stop and chop fashion to remove the lens nucleus and epinucleus.  The remaining cortex was then removed using the irrigation and aspiration handpiece. Provisc was then placed into the capsular bag to distend it for lens placement.  A ZCBOO 26.0 diopter lens was then injected into the capsular bag.  The remaining viscoelastic was aspirated.  Wounds were hydrated with balanced salt solution.  The anterior chamber was inflated to a physiologic pressure with balanced salt solution.  0.1 mL of cefuroxime 10 mg/mL were injected into the anterior chamber for a dose of 1 mg of intracameral antibiotic at the completion  of the case. Miostat was placed into the anterior chamber to constrict the pupil.  No wound leaks were noted.  Topical Vigamox drops and Maxitrol ointment were applied to the eye.  The patient was taken to the recovery room in stable condition without complications of anesthesia or surgery.   ____________________________ Deirdre Evenerhadwick R. Daisy Mcneel, MD crb:at D: 06/11/2014 14:27:40 ET T: 06/11/2014 16:56:35 ET JOB#: [id number]  cc: Deirdre Evenerhadwick R. Scotland Korver, MD, <Dictator> Lockie MolaHADWICK Zevin Nevares MD ELECTRONICALLY SIGNED 06/17/2014 21:48

## 2015-01-24 NOTE — Op Note (Signed)
PATIENT NAME:  [name], [name] MR#:  [id number] DATE OF BIRTH:  11/15/1955  DATE OF PROCEDURE:  07/17/2014  PREOPERATIVE DIAGNOSIS:  Nuclear sclerotic cataract right eye.  ICD-10 H25.11  POSTOPERATIVE DIAGNOSIS:  Nuclear sclerotic cataract right eye.  PROCEDURE:  Phacoemulsification with posterior chamber intraocular lens implantation of the right eye.  LENS:  ZCB00 25.0-diopter posterior chamber intraocular lens.  ULTRASOUND TIME:  21 % of 1 minute, 5 seconds.  CDE 13.3.  SURGEON:  [place]had Mickell Birdwell, MD  ANESTHESIA:  Topical with tetracaine drops and 2% Xylocaine jelly.  COMPLICATIONS:  None.  DESCRIPTION OF PROCEDURE:  The patient was identified in the holding room and transported to the operating room and placed in the supine position under the operating microscope.  The right eye was identified as the operative eye and it was prepped and draped in the usual sterile ophthalmic fashion.  A 1 millimeter clear-corneal paracentesis was made at the 12:00 position.  The anterior chamber was filled with Viscoat viscoelastic.  A 2.4 millimeter keratome was used to make a near-clear corneal incision at the 9:00 position.  A curvilinear capsulorrhexis was made with a cystotome and capsulorrhexis forceps.  Balanced salt solution was used to hydrodissect and hydrodelineate the nucleus.  Phacoemulsification was then used in stop and chop fashion to remove the lens nucleus and epinucleus.  The remaining cortex was then removed using the irrigation and aspiration handpiece.  Provisc was then placed into the capsular bag to distend it for lens placement.  A ZCB00 25.0-diopter lens was then injected into the capsular bag.  The remaining viscoelastic was aspirated.  Wounds were hydrated with balanced salt solution.  The anterior chamber was inflated to a physiologic pressure with balanced salt solution.  0.1 mL of cefuroxime 10 mg/mL were injected into the anterior chamber for a dose of 1 mg of intracameral  antibiotic at the completion of the case. Miostat was placed into the anterior chamber to constrict the pupil.  No wound leaks were noted.  Topical Vigamox drops and Maxitrol ointment were applied to the eye.  The patient was taken to the recovery room in stable condition without complications of anesthesia or surgery.  ____________________________ Deirdre Evenerhadwick R. Gabby Rackers, MD crb:jp D: 07/17/2014 08:13:00 ET T: 07/17/2014 08:29:54 ET JOB#: [id number]  cc: Deirdre Evenerhadwick R. Shalika Arntz, MD, <Dictator> Lockie MolaHADWICK Shenouda Genova MD ELECTRONICALLY SIGNED 07/23/2014 12:56

## 2015-01-24 NOTE — Consult Note (Signed)
PATIENT NAME:  [name], Welma S MR#:  [id number] DATE OF BIRTH:  04-16-1956  DATE OF CONSULTATION:  06/11/2014  REFERRING PHYSICIAN:  Amy M. Rice, MD.  PRIMARY CARE PHYSIC[place]  CONSULTING PHYSICIAN:  Vivek J. Cherlynn KaiserSainani, MD  REASON FOR CONSULTATION: SVT.   HISTORY OF PRESENT ILLNESS: This is a 59 year old female who was electively brought to the hospital for cataract surgery. Intraoperatively, the patient went into SVT with heart rates in the 120s. She received some esmolol, which did not improve her symptoms. With some vagal maneuvers intraoperatively, her heart rate did come down. Postoperatively, she continues to have intermittent episodes of tachycardia and with vagal maneuvers comes back down by itself. The patient is clinically asymptomatic complaining of no chest pain, palpitations, nausea, vomiting, shortness of breath, fevers, chills, cough or any other associated symptoms. Hospitalist services were contacted for further treatment and evaluation.   REVIEW OF SYSTEMS:   CONSTITUTIONAL: No documented fever. No weight gain or weight loss.  EYES: No blurred or double vision.  ENT: No tinnitus. No postnasal drip. No redness of the oropharynx.  RESPIRATORY: No cough, no wheeze, no hemoptysis, no dyspnea.  CARDIOVASCULAR: No chest pain, no orthopnea, no palpitations, no syncope.  GASTROINTESTINAL: No nausea. No vomiting, or diarrhea. No abdominal pain. No melena or hematochezia.  GENITOURINARY: Dysuria and hematuria.  ENDOCRINE: No polyuria or nocturia. No heat or cold intolerance.  HEMATOLOGIC: No anemia, no bruising, no bleeding.  INTEGUMENTARY: No rashes. No lesions.  MUSCULOSKELETAL: No arthritis. No swelling. No gout.  NEUROLOGIC: No numbness, tingling. No ataxia. No seizure of activity cycle. PSYCHIATRIC: No anxiety. No insomnia. No ADD.  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  Hypertension, neuropathy, hypothyroidism, history of aortic stenosis.   ALLERGIES: No known drug allergies.   SOCIAL HISTORY: Still smokes about a half a pack to a pack per day, has been smoking for the past 30 to 40 years. Occasional alcohol use. No illicit drug abuse.   FAMILY HISTORY: The patient's mother is still alive. Father passed away with complications of heart disease.   CURRENT MEDICATIONS: Are as follows: Lisinopril 40 mg daily, omeprazole 20 mg daily, Synthroid 50 mcg daily and Neurontin 300 mg at bedtime.   PHYSICAL EXAMINATION: Presently is as follows:  VITAL SIGNS: Temperature: She is afebrile. Pulse in the  low 110s, respirations 18, blood pressure 160/110, saturations 100% on 2 liters of nasal cannula.  GENERAL: She is a pleasant-appearing female in no apparent distress.  HEAD, EYES, EARS, NOSE AND THROAT: Atraumatic, normocephalic. Extraocular muscles are intact. Pupils equal and reactive to light. Sclerae anicteric. No conjunctival injection. No pharyngeal erythema.  NECK: Supple. There is no jugular venous distention. No bruits. No thyromegaly.  HEART: Regular rate and rhythm. No murmurs, no rubs, no clicks.  LUNGS: Clear to auscultation bilaterally. No rales, no rhonchi, no wheezes.  ABDOMEN: Soft, flat, nontender, nondistended. Has good bowel sounds. No hepatosplenomegaly appreciated.  EXTREMITIES: No evidence of any cyanosis, clubbing or peripheral edema. There are +2 pedal and radial pulses bilaterally.  NEUROLOGICAL: The patient is awake, alert, oriented x 3 with no focal motor or sensory deficits bilaterally.  SKIN: Moist and warm with no rashes appreciated.  LYMPHATIC: There is no cervical or axillary lymphadenopathy.   LABORATORY DATA: She has had 2 sets of serial troponins done, which are negative.   ASSESSMENT AND PLAN: This is a 59 year old female with  history of chronic obstructive pulmonary disease with ongoing tobacco abuse, history of aortic valve  replacement, hypertension, neuropathy, hypothyroidism, who presents to the hospital electively for cataract surgery, but noted to be in supraventricular tachycardia intraoperatively and postoperatively.  1. Supraventricular tachycardia/sinus arrhythmia. The exact etiology of this is unclear, but the patient is clinically asymptomatic. There is no evidence of acute coronary syndrome as the patient's cardiac markers x 2 have been negative. For now, her electrolytes likely need to be checked but it does not to be done urgently. I will discharge her on some oral metoprolol as needed for palpitations. I also spoke with the cardiologist, Dr. Mariah Milling, who will see her in his office in the next 1 to 2 days.  2. Chronic obstructive pulmonary disease with no acute exacerbation. The patient was strongly advised to quit smoking. She is not on oxygen. She is currently not using any inhalers.  3. Hypertension. She will continue her lisinopril.  4. Hypothyroidism. She will continue her Synthroid.  5. Neuropathy/chronic pain. She will continue on Neurontin.  Thank you so much for the consultation. The patient is stable to be discharged from the medical standpoint.   TIME SPENT ON CONSULT: 50 minutes.     ____________________________ Rolly Pancake. Cherlynn Kaiser, MD vjs:TT D: 06/11/2014 15:21:34 ET T: 06/11/2014 16:13:11 ET JOB#: [id number]  cc: Rolly Pancake. Cherlynn Kaiser, MD, <Dictator> Houston Siren MD ELECTRONICALLY SIGNED 06/23/2014 9:37

## 2015-01-25 NOTE — Consult Note (Signed)
General Aspect 59 yo woman with a long smoking hx, known severe aortic valve stenosis, mild coronary artery disease by cardiac cath several years ago at [facility], chronic back pain on pain meds, presenting with chest pain. Cardiology was consulted for chest pain and elevated cardiac enz.  Patient is deaf and a poor historian. Most details provided by her daughter and old notes from [facility].Daughter reports patient has been out of her pain meds for the past week. She has been taking more pain meds for back and chest pain. She has had worsening SOB over the past month or two with sinus congestion. She reports having a coarse of ABX for sinus congestion but this did not help. She was supposed to have tubes in her ears but they did not place these secondary to worries about her heart valve.  Per the daughter, the patients house is a mess, there is no food. She lives also with a disabled son, with mother with dementia. "nobody is taking care of themselves" per the daughter.   Last echo 01/2010 showed progression of her aortic stenosis with a peak velocity of 3.9 m/sec consistent with a peak gradient of 57 mmHg and estimated mean gradient of 36 mmHg.  At that time, she was seen by Dr. Jonathon Resides at [facility] and referred to Dr. Anne Shutter.  Dr. Anne Shutter evaluated her and recommended aortic valve replacement.  She was very nervous and quite anxious at that time and would not commit to having surgery.  She subsequently followed up with Dr. Alean Rinne and Dr. Kandis Cocking, both of those appointments were in July 2011and both the doctors emphasized the importance of aortic valve replacement.  She did have a cardiac catheterization done that was back in April 2011,which did show no hemodynamically significant coronary disease.  There was some mild plaquing.  She had follow up with Dr. Roxan Hockey at [facility] for AVR and again she refused surgery.  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     Present Illness . PAST MEDICAL HISTORY:   Significant for aortic stenosis,  mild non-hemodynamically significant coronary artery disease,  congestive heart failure,  dyslipidemia,  hypothyroidism,  Barrett esophagus, gastroesophageal reflux, peptic ulcer disease,  depression, and  seizures at age 13.  .  ALLERGIES:  She has no known drug allergies.    FAMILY HISTORY:  Significant for father having an MI, heart valve problems in the family    SOCIAL HISTORY:  She is disabled due to her nerves and chronic back pain  and migraine headaches.  She does live at home.  She smokes one pack a  day and has for over 30 years and continues to smoke at this time.   Physical Exam:   GEN WD, WN, NAD, deaf    HEENT red conjunctivae    NECK supple    RESP postive use of accessory muscles  decreased BS B/L    CARD Regular rate and rhythm  Murmur    Murmur Systolic    Systolic Murmur Out flow    ABD denies tenderness  soft    LYMPH negative neck    EXTR negative edema    SKIN normal to palpation    NEURO cranial nerves intact, motor/sensory function intact    PSYCH alert, A+O to time, place, person, good insight   Review of Systems:   Subjective/Chief Complaint Chest pains, back pain, sinus congestion, SOB    General: as above  Skin: No Complaints    ENT: No Complaints    Eyes: No Complaints    Neck: No Complaints    Respiratory: Short of breath    Cardiovascular: Chest pain or discomfort    Gastrointestinal: No Complaints    Genitourinary: No Complaints    Vascular: No Complaints    Musculoskeletal: No Complaints    Neurologic: No Complaints    Hematologic: No Complaints    Psychiatric: No Complaints    Review of Systems: All other systems were reviewed and found to be negative    Medications/Allergies Reviewed Medications/Allergies reviewed        Admit Diagnosis:   CHEST PAIN [place] AMI: 31-Oct-2011, Active, CHEST PAIN [place] AMI      Admit  Reason:   Unspecified chest pain: (786.50) 31-Oct-2011, Active, ICD9, Unspecified chest pain, Auto-generated by [facility] Based on Admission Order   Intermediate coronary syndrome: (411.1) 31-Oct-2011, Active, ICD9, Intermediate coronary syndrome, Auto-generated by [facility] Based on Admission Order   Acute myocardial infarction, unspecified site, episode of care unspecified: (410.90) 31-Oct-2011, Active, ICD9, Acute myocardial infarction, unspecified site, episode of care unspecified, Auto-generated by [facility] Based on Admission Order  Home Medications:  acetaminophen-hydrocodone 500 mg-10 mg oral tablet: 1 tab(s) orally 4 times a day as needed  , Active  methocarbamol 500 mg oral tablet: 1 tab(s) orally 2 times a day, As Needed, Active  levothyroxine 50 mcg (0.05 mg) oral tablet: 1 tab(s) orally once a day, Active  omeprazole 20 mg oral delayed release capsule: 1 cap(s) orally once a day, Active    Routine Chem:  28-Jan-13 17:29    Glucose, Serum 107   BUN 11   Creatinine (comp) 1.07   Sodium, Serum 136   Potassium, Serum 3.8   Chloride, Serum 107   CO2, Serum 19   Calcium (Total), Serum 8.5  Hepatic:  28-Jan-13 17:29    Bilirubin, Total 0.2   Alkaline Phosphatase 92   SGPT (ALT) 12   SGOT (AST) 26   Total Protein, Serum 7.6   Albumin, Serum 3.5  Routine Chem:  28-Jan-13 17:29    Osmolality (calc) 272   eGFR (African American) >60   eGFR (Non-African American) 56   Anion Gap 10  Routine Hem:  28-Jan-13 17:29    WBC (CBC) 8.9   RBC (CBC) 4.41   Hemoglobin (CBC) 14.2   Hematocrit (CBC) 42.0   Platelet Count (CBC) 375   MCV 95   MCH 32.3   MCHC 33.9   RDW 15.8   Neutrophil % 44.3   Lymphocyte % 48.5   Monocyte % 6.1   Eosinophil % 0.4   Basophil % 0.7   Neutrophil # 3.9   Lymphocyte # 4.3   Monocyte # 0.5   Eosinophil # 0.0   Basophil # 0.1  Thyroid:  28-Jan-13 17:29    Thyroid Stimulating Hormone 1.63  Cardiac:  28-Jan-13 17:29    CK, Total 102   CPK-MB, Serum 4.7    Troponin I 0.07  Routine UA:  28-Jan-13 17:29    Color (UA) Yellow   Clarity (UA) Cloudy   Glucose (UA) Negative   Bilirubin (UA) Negative   Ketones (UA) Negative   Specific Gravity (UA) 1.032   Blood (UA) 2+   pH (UA) 5.0   Nitrite (UA) Negative   Leukocyte Esterase (UA) 2+   RBC (UA) 4 /HPF   WBC (UA) 15 /HPF   Bacteria (UA) TRACE   Epithelial Cells (UA) 9 /HPF  Mucous (UA) PRESENT   Hyaline Cast (UA) 2 /LPF  Cardiac:  29-Jan-13 01:41    CK, Total 71   CPK-MB, Serum 3.1   Troponin I 0.07  Routine Chem:  29-Jan-13 01:41    Cholesterol, Serum 195   Triglycerides, Serum 237   HDL (INHOUSE) 30   VLDL Cholesterol Calculated 47   LDL Cholesterol Calculated 118   EKG:   Interpretation EKG shows sinus tachycardia nonspecific ST ABn in leads V5, V6, I and AVL, poor R wave progression through the precordial leads   Radiology Results: XRay:    28-Jan-13 17:43, Chest PA and Lateral   Chest PA and Lateral    REASON FOR EXAM:    short of breath  COMMENTS:   May transport without cardiac monitor    PROCEDURE: DXR - DXR CHEST PA (OR AP) AND LATERAL  - Oct 31 2011  5:43PM     RESULT: Comparison: None.    Findings:  The heart and mediastinum are within normallimits. No focal pulmonary   opacities. Mild prominence of the pulmonary interstitium is felt to be   technical.    IMPRESSION:   No acute cardiopulmonary disease.      Verified By: Gregor Hams, M.D., MD    No Known Allergies:   Vital Signs/Nurse's Notes: **Vital Signs.:   29-Jan-13 08:15   Temperature Temperature (F) 97.7   Celsius 36.5   Temperature Source oral   Pulse Pulse 65   Pulse source per Dinamap   Respirations Respirations 18   Systolic BP Systolic BP 947   Diastolic BP (mmHg) Diastolic BP (mmHg) 71   Mean BP 83   BP Source Dinamap   Pulse Ox % Pulse Ox % 94   Pulse Ox Activity Level  At rest   Oxygen Delivery Room Air/ 21 %     Impression 59 yo woman with a long smoking hx, known  severe aortic valve stenosis, mild coronary artery disease by cardiac cath several years ago at [facility], chronic back pain on pain meds, presenting with chest pain.   A/P:  1)Chest pain: Suspect secondary to underlying severe aortic valve stenosis. She ran out of pain meds last week, has been taking more, possibly to mask chest pain. She has refused aortic valve surgery in the past multiple times. --Will obtain repeat echo here (two years from last echo) --Follow up as an outpt. Will discuss case with interventional MDs at [facility]. perhaps she might be a candidate for percutaneous aortic valve or valvuloplasty. (cone in cath lab) --No further workup for coronary disease at this time unless she would like to proceed with AVR with sternotomy.(would need cath first)  2) Elevated cardiac enz: Suspect secondary to tachycardia in the setting of severe aortic valve stenosis. Cardiac cath in 2011 with minimal disease. No further workup at this time.  3) Severe aortic valve stenosis: As above Repeat echo pending  4) SOB: Could be secondary to severe aortic valve stenosis. Unable to exclude COPD, possible COPD exacerbation. Has recent sinus congestion x 2 months. Recent ABX. ENT per the daughter refused ear tubes secondary to her heart valve issue. --May need inhalers? --Smoking cessation.  5)Sinus congestion: Now also with worsening hearing loss. ENT refused drainage tubes in the past secondary to aortic valve stenosis. (procedure is such low risk and probably could be done if needed)   Electronic Signatures: Ida Rogue (MD)  (Signed 29-Jan-13 09:38)  Authored: General Aspect/Present Illness, History and Physical Exam, Review of System,  Health Issues, Home Medications, Labs, EKG , Radiology, Allergies, Vital Signs/Nurse's Notes, Impression/Plan   Last Updated: 29-Jan-13 09:38 by Ida Rogue (MD)

## 2015-01-25 NOTE — Discharge Summary (Signed)
PATIENT NAME:  [name], [name] MR#:  [id number] DATE OF BIRTH:  Apr 26, 1956  DATE OF ADMISSION:  10/31/2011 DATE OF DISCHARGE:  11/01/2011  DISCHARGE DIAGNOSES:  1. Chest pain secondary to chronic obstructive pulmonary disease exacerbation, also secondary to aortic stenosis.  2. Hypothyroidism.  3. Chronic obstructive pulmonary disease exacerbation.  4. Urinary tract infection.   DISCHARGE MEDICATIONS:  1. Percocet 10/500, q.i.d. she takes it for chronic back pain. 2. Levothyroxine 50 mcg p.o. daily.  3. Omeprazole 20 mg p.o. daily.  4. Spiriva 1 capsule inhalation daily.  5. Combivent metered-dose inhaler 2 puffs q.6 hours. 6. Cipro 500 mg p.o. b.i.d.   DIET: Regular diet.   FOLLOW UP: Follow up with Dr. Julien Nordmannimothy Gollan on 02/13 at 2:30 p.m.   CONSULTATION: Cardiology consult with Dr. Mariah MillingGollan.   HOSPITAL COURSE: 59 year old female patient with history of severe aortic stenosis and also mild coronary artery disease, chronic back pain on pain medications came in because of:  1. Chest pain and elevated cardiac enzymes. Patient has been smoking and also has trouble breathing with cough and congestion. Patient is admitted for chest pain. Look at the history for full details. Patient'[name] echocardiogram May 2011 showed aortic stenosis. At that time patient refused surgery. Patient had repeat echocardiogram done which showed ejection fraction of more than 55% with severe aortic stenosis with estimated aortic valve area of 0.7 sq cm and mean gradient 56 mm and Dr. Mariah MillingGollan he is going to [facility] as an outpatient. Patient'[name] troponin did go up to 0.06 during the hospital stay but next day after starting her pain medication her pain in the chest resolved. Patient has been on chronic pain medications but she has been out of those medications before she came. Her EKG showed sinus tachycardia with nonspecific changes V5 and V6 and poor R-wave progression in precordial leads.  Patient'[name] elevated cardiac enzymes are thought to be secondary to tachycardia in the setting of aortic stenosis. Patient has to follow up with Dr. Julien Nordmannimothy Gollan as an outpatient for her aortic stenosis. 2. Trouble breathing. Patient has history of smoking and lungs had some wheezing and she also has sinus congestion. She is supposed to tubes in the ears but she did not have it because according to the ENT did not want to put the tube because of her heart condition. She is discharged with Spiriva, Combivent and also Cipro for her urinary tract infection. Advised her to stop smoking.   LABORATORY, DIAGNOSTIC AND RADIOLOGICAL DATA: Echocardiogram as I mentioned. Urine cultures have been negative. Electrolytes: Sodium 136, potassium 3.8, chloride 107, bicarbonate 19, BUN 11, creatinine 1.17, glucose 107. Chest x-ray showed no acute cardiopulmonary disease. WBC 8.9, hemoglobin 14.2, hematocrit 42, platelets 375. Troponins initially 0.07, next one 0.06 with CK being 102 and CPK-MB 4.7. Urinalysis showed 2+ leukocyte esterase on admission with cloudy urine and she has suspected urinary tract infection and was treated with Cipro and urine cultures came back negative. Patient'[name] LDL showed 118 and VLDL 47, triglycerides 237, HDL 30.   CONDITION ON DISCHARGE: Condition at the time of discharge stable.   NOTE: Patient'[name] daughter said that she does not want the pain medication prescription and she will take it from her primary doctor.   TIME SPENT: More than 30 minutes.   ____________________________ Katha HammingSnehalatha Emmanuell Kantz, MD sk:cms D: 11/07/2011 13:16:35 ET T: 11/08/2011 10:56:42 ET JOB#: [id number]  cc: Katha HammingSnehalatha Amedio Bowlby, MD, <Dictator> Katha HammingSNEHALATHA Christphor Groft MD ELECTRONICALLY SIGNED 11/15/2011 14:52

## 2015-01-25 NOTE — H&P (Signed)
PATIENT NAME:  [name], [name] DATE OF BIRTH:  27-Mar-1956  DATE OF ADMISSION:  10/31/2011  PRIMARY CARE PHYSICIAN: Dr. Lois HuxleyJames Davis   EMERGENCY ROOM PHYSICIAN: Dr. Dorothea GlassmanPaul Malinda   CHIEF COMPLAINT: Chest pain.   HISTORY OF PRESENT ILLNESS: The patient is a 59 year old female who presents with chief complaint of intermittent chest pain. Symptoms began today. The patient had two episodes of substernal chest pain associated with shortness of breath. The patient had [facility]. She had coronary disease. At that time she refused coronary artery bypass surgery. Today the patient took four aspirin for the chest pain with some relief. The patient is a poor historian. She denies any fevers or chills. Denies any dizziness. Denies palpitations, syncope. She denies any other aggravating or alleviating factors.   PAST MEDICAL HISTORY:  1. Coronary artery disease. 2. Gastroesophageal reflux disease.  3. Hypothyroidism.   ALLERGIES: No known drug allergies.   CURRENT MEDICATIONS:  1. Acetaminophen/hydrocodone 500/10 mg p.o. q.i.d. p.r.n.  2. Levothyroxine 50 mcg p.o. daily.  3. Methocarbamol 500 mg one p.o. b.i.d. p.r.n.  4. Omeprazole 20 mg p.o. daily.   SOCIAL HISTORY: The patient is a smoker. Denies alcohol abuse or drug abuse. She lives with her mom and her grandson.   FAMILY HISTORY: The patient's mother has hypertension. Grandson has hypertension as well.   REVIEW OF SYSTEMS: CONSTITUTIONAL: The patient denied any fevers, chills, night sweats. HEENT: The patient denies any hearing loss, dysphagia, visual problems, sore throat. CARDIOVASCULAR: The patient denies any orthopnea, PND, syncope. RESPIRATORY: The patient denied any cough, wheezing, or hemoptysis. GI: The patient denies any nausea, vomiting, abdominal pain, hematemesis, hematochezia, melena. GU: The patient denies any hematuria, dysuria, frequency. NEUROLOGIC: The patient denies any  headache, focal weakness, seizures. SKIN: The patient denies any lesions or rash. ENDOCRINE: The patient denies polyuria, polyphagia, polydipsia. MUSCULOSKELETAL: The patient denies any arthritis, joint effusion, swelling. HEMATOLOGIC: The patient denies easy bleeding or bruises.   PHYSICAL EXAMINATION:   VITAL SIGNS: Temperature 95.3, heart rate 82, respirations 18, blood pressure 149/78, oxygen sat 98%.   HEENT: Atraumatic, normocephalic. Pupils equal, round, and reactive to light and accommodation. Extraocular movements intact. Sclerae is anicteric. Mucous membranes moist.   NECK: Supple. No organomegaly.   CARDIOVASCULAR: S1, S2, regular rate, rhythm. No gallops. No thrills. No murmurs.   RESPIRATORY: Lungs are clear to auscultation. No rales, no rhonchi, no wheezes, no bronchial breath sounds.   GI: Abdomen is soft, nontender, nondistended. Normal bowel sounds. No hepatosplenomegaly.    GU: There is no hematuria or masses noted.   SKIN: No lesions, no rash.   ENDOCRINE: No masses, no thyromegaly.   LYMPH: No lymphadenopathy or nodes palpable.   NEUROLOGIC: Cranial nerves II to XII grossly intact. Motor strength is 5 out of 5 bilateral upper and lower extremities. Sensation within normal limits. No focal neurological deficits noted on examination.   MUSCULOSKELETAL: No arthritis, joint effusion, or swelling.   HEMATOLOGICAL: No ecchymosis, no bleeding, no petechiae noted.   LABORATORY, DIAGNOSTIC, AND RADIOLOGICAL DATA: Echocardiogram sinus tachycardia, LVH, 101 beats per minute. No acute ST or T wave changes.   Urinalysis shows 15 WBCs. Glucose 107, BUN 11,  creatinine 1.07, sodium 136, potassium 3.8, chloride 107, CO2 19, calcium 8.5, total bilirubin 0.2, alkaline phosphatase 92, ALT 12, AST 26, total protein 7.6, albumin 3.5, estimated GFR 56. WBC count 8,900, hemoglobin 14.2, hematocrit 42.0, platelet count 375.   ASSESSMENT  AND PLAN:  1. The patient is a 59 year old female  who presents with chief complaint of chest pain, mild acute MI. Telemetry. Start on aspirin, metoprolol, Lovenox. Check serial cardiac enzymes, troponin, echo. Cardiology consultation.  2. Hypothyroidism. Continue Synthroid. Check TSH.  3. Urinary tract infection. Check urine culture. Start Rocephin. 4. Gastroesophageal reflux disease. Continue PPI.  5. Tobacco abuse. Smoking cessation. Place nicotine patch.  6. History of chronic obstructive pulmonary disease. The patient is currently not wheezing.  7. History of myocardial infarction. The patient was recommended coronary artery bypass graft about four years ago at [facility]. The patient at that time had refused surgery. History of valvular heart disease as well. She has been managed medically.   The patient is wanting to be discharged the morning of 11/01/2011. I have had a long discussion with the patient and explained that her discharge will depend upon further recommendations from Cardiology.   ____________________________ Donia Ast, MD [id number] D: 10/31/2011 23:10:46 ET T: 11/01/2011 07:49:14 ET JOB#: [id number]  cc: Donia Ast, MD, <Dictator> Dr. Lois Huxley  Donia Ast MD ELECTRONICALLY SIGNED 11/01/2011 22:05

## 2015-09-08 ENCOUNTER — Ambulatory Visit: Payer: Medicaid Other | Admitting: Cardiovascular Disease

## 2015-10-13 ENCOUNTER — Encounter: Payer: Self-pay | Admitting: *Deleted

## 2015-10-14 ENCOUNTER — Ambulatory Visit: Payer: Medicaid Other | Admitting: Nurse Practitioner

## 2015-10-14 ENCOUNTER — Encounter: Payer: Self-pay | Admitting: Nurse Practitioner

## 2015-10-14 ENCOUNTER — Encounter: Payer: Self-pay | Admitting: *Deleted

## 2015-10-14 DIAGNOSIS — Z720 Tobacco use: Secondary | ICD-10-CM | POA: Insufficient documentation

## 2015-10-14 DIAGNOSIS — I119 Hypertensive heart disease without heart failure: Secondary | ICD-10-CM | POA: Insufficient documentation

## 2015-10-19 ENCOUNTER — Other Ambulatory Visit: Payer: Self-pay

## 2016-05-11 IMAGING — CT CT HEAD W/O CM
3 of 4 series · 17 of 30 positions shown, 19 images · non-contrast
Comparison: 02/22/2014

CLINICAL DATA: Altered mental status, overdose

EXAM:
CT HEAD WITHOUT CONTRAST
TECHNIQUE: Contiguous axial images were obtained from the base of the skull
through the vertex without intravenous contrast.

[Series 2: head w/o · axial · non-contrast · 0.45mm/px · z∈[-163,-43]mm · 8 of 32 slices shown, 10 images (1 of 2)]
[im 4/32  brain]
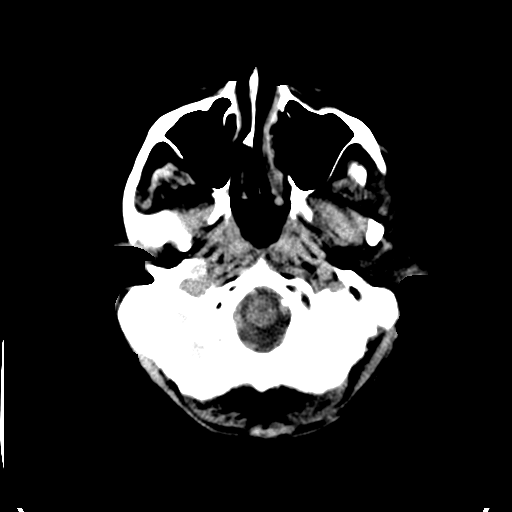
[im 4/32  bone]
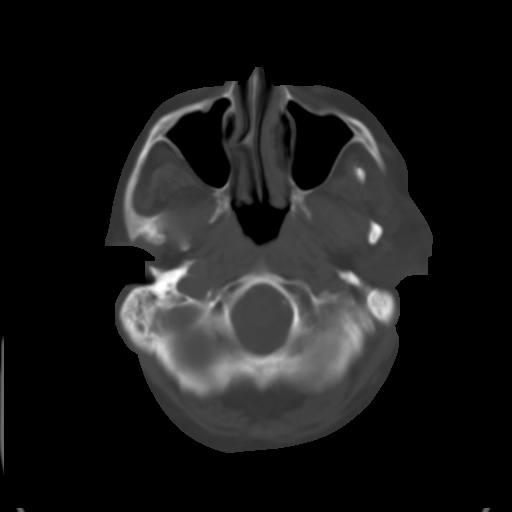
[im 7/32  brain]
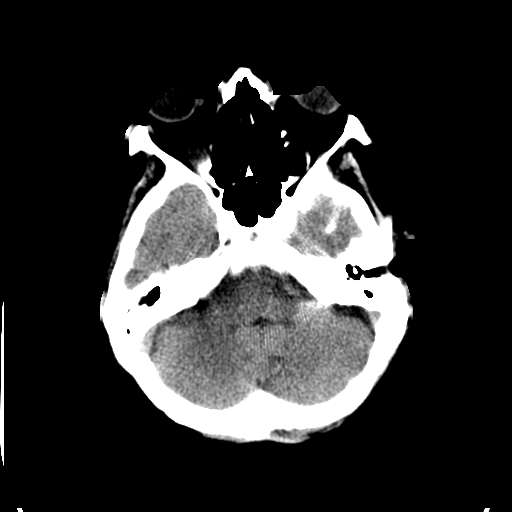
[im 11/32  brain]
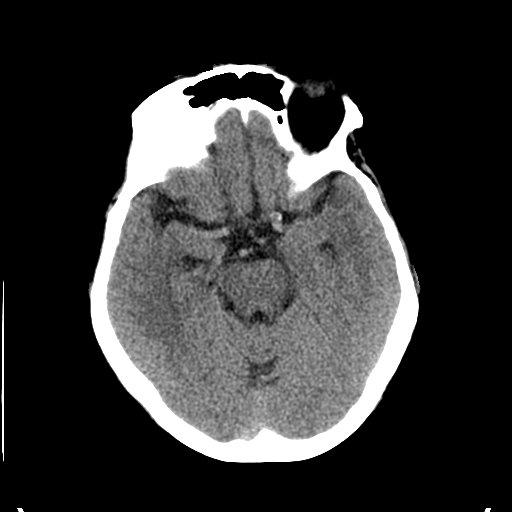
[im 14/32  brain]
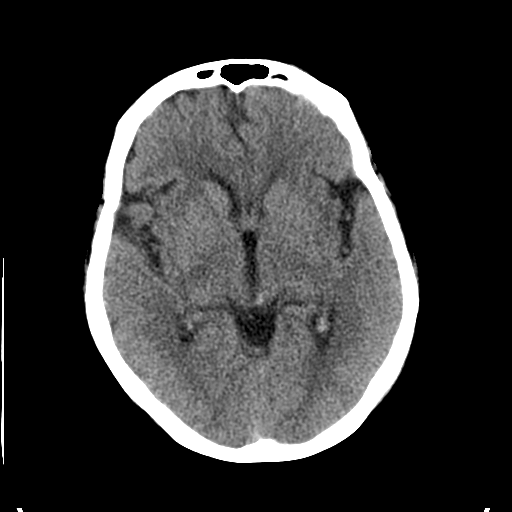
[im 18/32  brain]
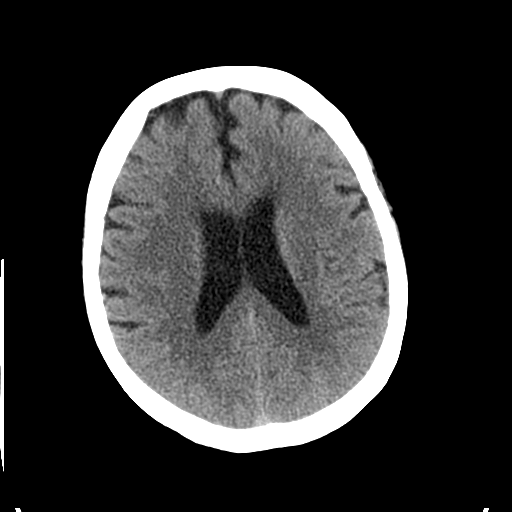
[im 18/32  bone]
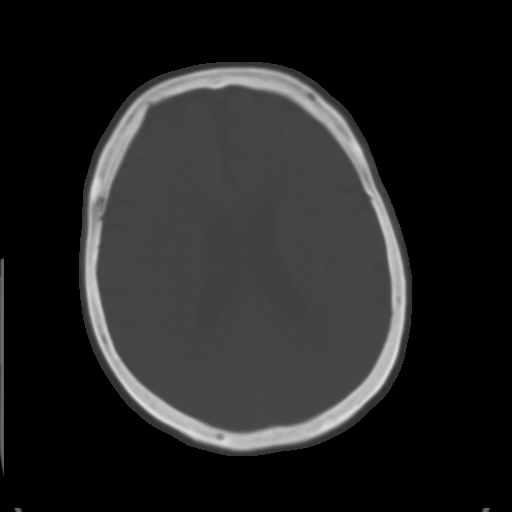
[im 21/32  brain]
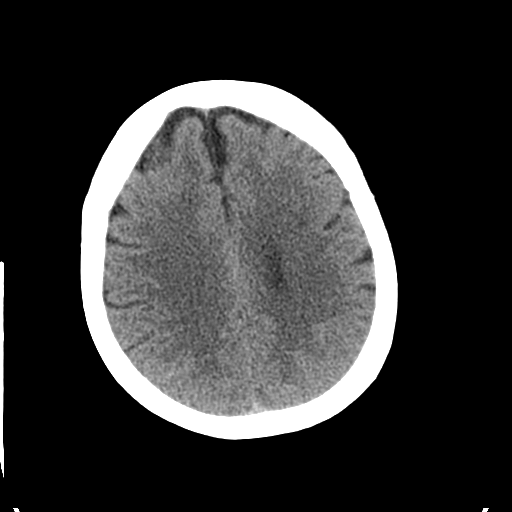
[im 25/32  brain]
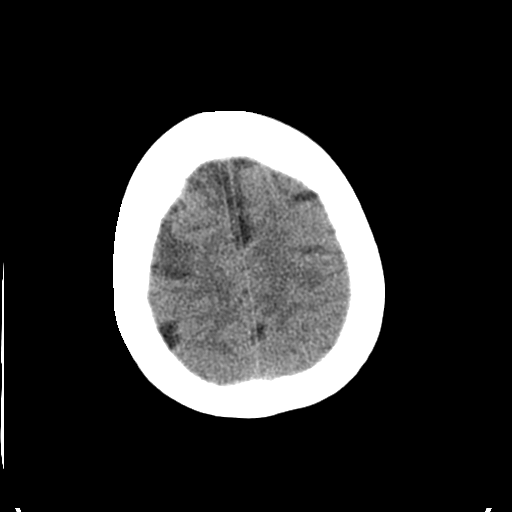
[im 28/32  brain]
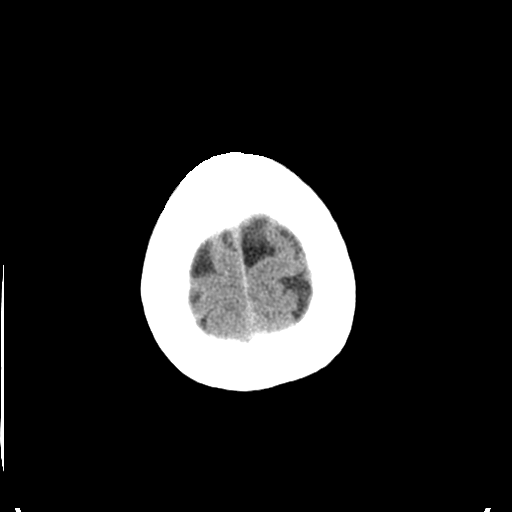

[Series 3: bone windows · axial · 0.45mm/px · z∈[-163,-78]mm · 6 of 32 slices shown]
[im 4/32  bone]
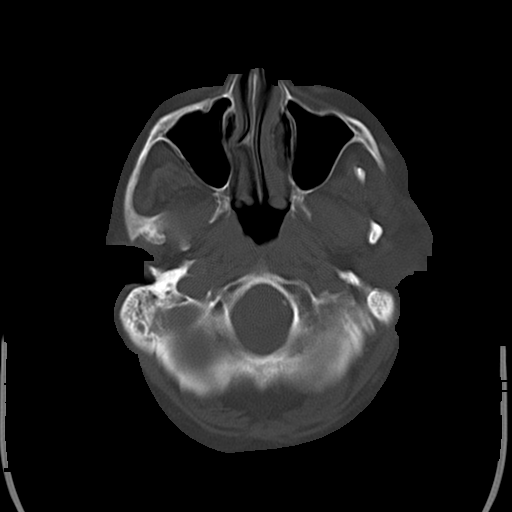
[im 7/32  bone]
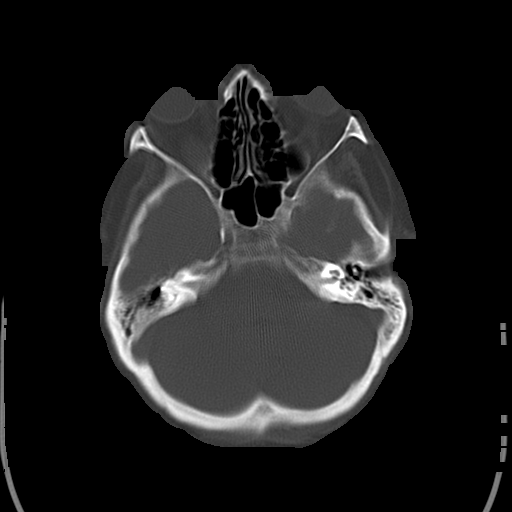
[im 11/32  bone]
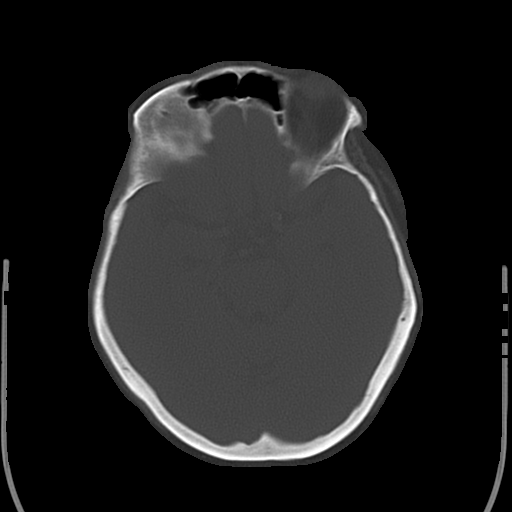
[im 14/32  bone]
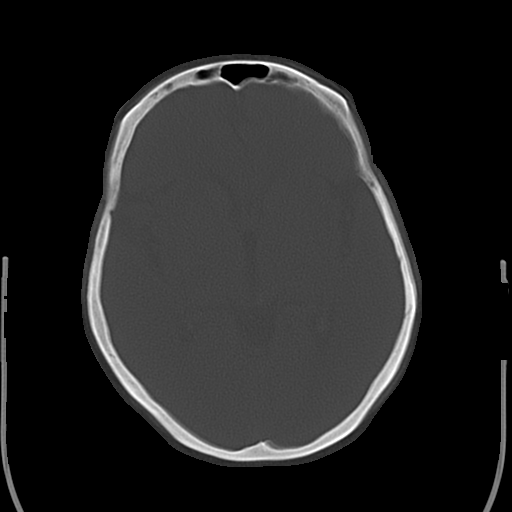
[im 18/32  bone]
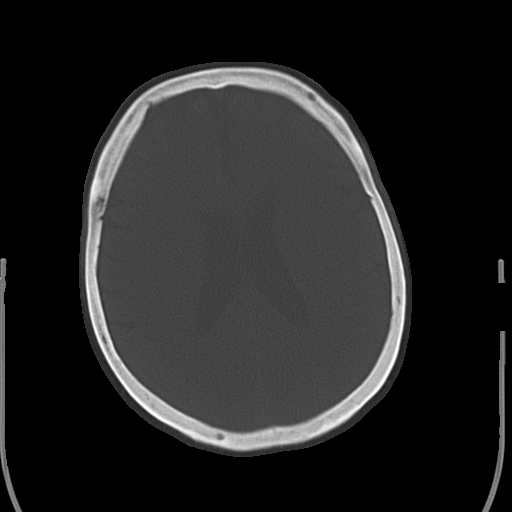
[im 21/32  bone]
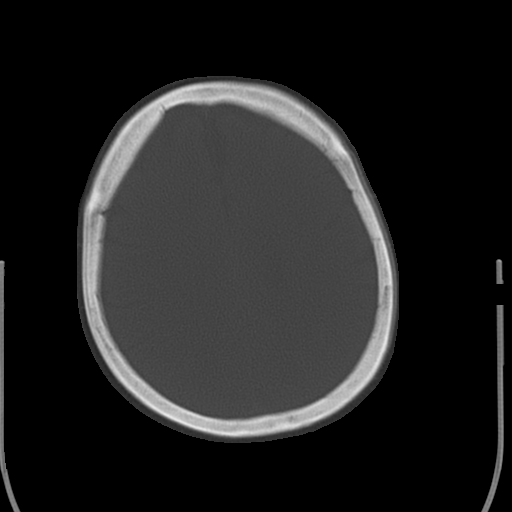

[Series 4: head w/o · axial · non-contrast · 0.45mm/px · z∈[-81,-46]mm · 3 of 15 slices shown (2 of 2)]
[im 4/15  brain]
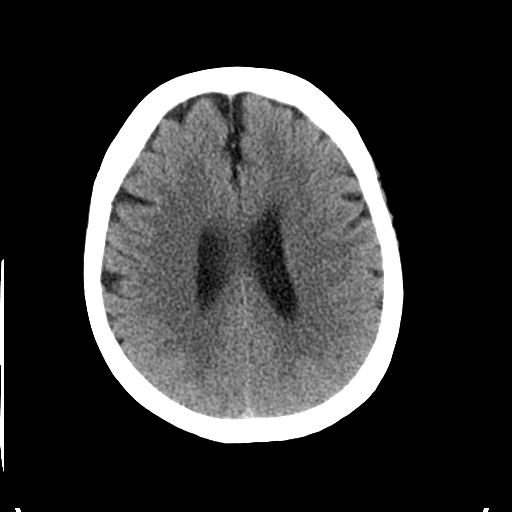
[im 8/15  brain]
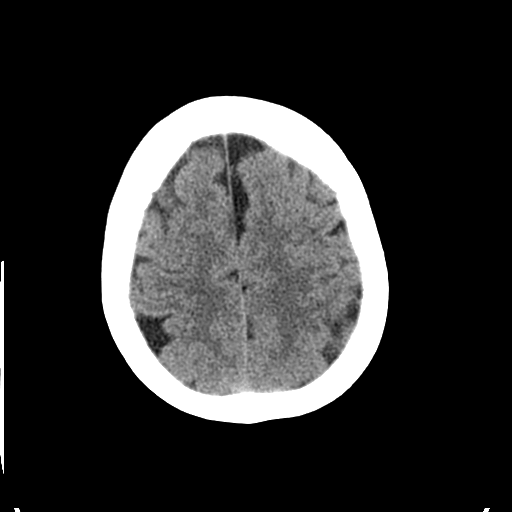
[im 11/15  brain]
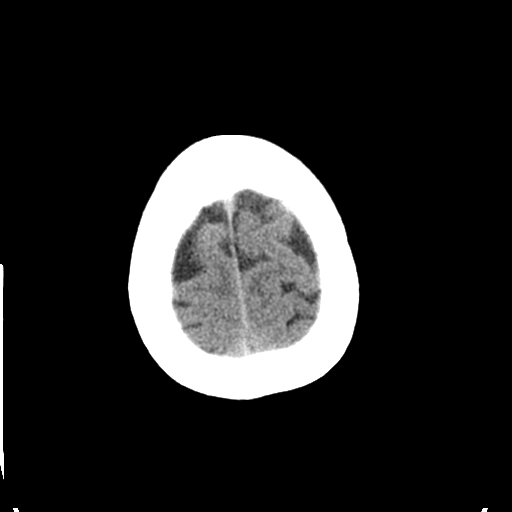

[17 of 30 positions shown; findings below may reference images not displayed]

FINDINGS: No skull fracture is noted. No intracranial hemorrhage, mass effect
or midline shift.

Paranasal sinuses are unremarkable. Stable chronic opacification of
left mastoid air cells. Atherosclerotic calcifications of carotid
siphon again noted.

No definite acute cortical infarction. No mass lesion is noted on
this unenhanced scan. Stable mild periventricular white matter
decreased attenuation probable due to chronic small vessel ischemic
changes. Stable subtle hypodensity bilateral frontal lobes probable
due to prior small infarcts.
IMPRESSION: No acute intracranial abnormality. Mild cerebral atrophy. No
definite acute cortical infarction. No mass lesion is noted on this
unenhanced scan.

## 2016-05-11 IMAGING — CR DG CHEST 1V PORT
1 series · 1 of 1 positions shown · non-contrast
Comparison: 12/18/2014

CLINICAL DATA: Altered mental status

EXAM:
PORTABLE CHEST - 1 VIEW

[AP]
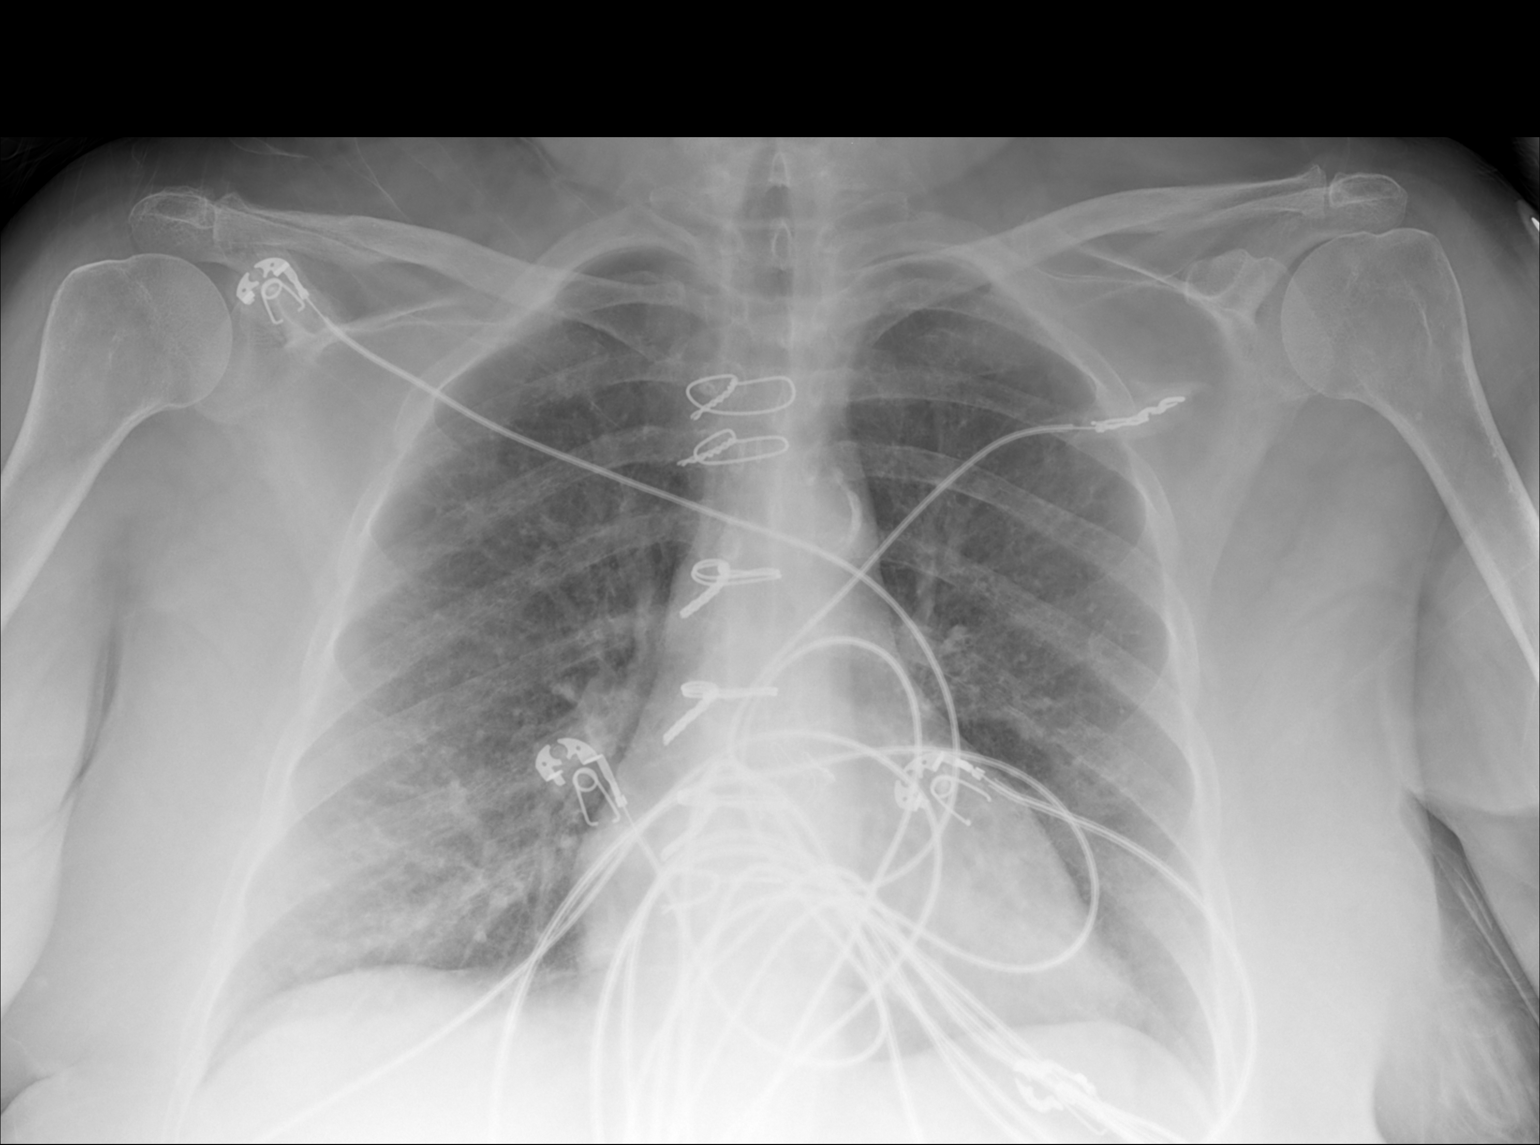

[1 of 1 positions shown; findings below may reference images not displayed]

FINDINGS: The heart size and mediastinal contours are within normal limits.
Both lungs are clear. The visualized skeletal structures are
unremarkable. Cardiac leads obscure detail. Evidence of CABG
reidentified.
IMPRESSION: No active disease.

## 2016-08-28 DIAGNOSIS — J449 Chronic obstructive pulmonary disease, unspecified: Secondary | ICD-10-CM | POA: Diagnosis not present

## 2016-08-28 DIAGNOSIS — R0902 Hypoxemia: Secondary | ICD-10-CM | POA: Diagnosis not present

## 2016-08-28 DIAGNOSIS — J189 Pneumonia, unspecified organism: Secondary | ICD-10-CM | POA: Diagnosis not present

## 2016-08-28 DIAGNOSIS — F200 Paranoid schizophrenia: Secondary | ICD-10-CM

## 2016-08-29 DIAGNOSIS — J189 Pneumonia, unspecified organism: Secondary | ICD-10-CM | POA: Diagnosis not present

## 2016-08-29 DIAGNOSIS — F200 Paranoid schizophrenia: Secondary | ICD-10-CM | POA: Diagnosis not present

## 2016-08-29 DIAGNOSIS — R0902 Hypoxemia: Secondary | ICD-10-CM | POA: Diagnosis not present

## 2016-08-29 DIAGNOSIS — J449 Chronic obstructive pulmonary disease, unspecified: Secondary | ICD-10-CM | POA: Diagnosis not present

## 2016-08-31 DIAGNOSIS — E871 Hypo-osmolality and hyponatremia: Secondary | ICD-10-CM

## 2016-08-31 DIAGNOSIS — J159 Unspecified bacterial pneumonia: Secondary | ICD-10-CM | POA: Diagnosis not present

## 2016-08-31 DIAGNOSIS — J441 Chronic obstructive pulmonary disease with (acute) exacerbation: Secondary | ICD-10-CM | POA: Diagnosis not present

## 2016-09-01 DIAGNOSIS — J441 Chronic obstructive pulmonary disease with (acute) exacerbation: Secondary | ICD-10-CM | POA: Diagnosis not present

## 2016-09-01 DIAGNOSIS — E871 Hypo-osmolality and hyponatremia: Secondary | ICD-10-CM | POA: Diagnosis not present

## 2016-09-01 DIAGNOSIS — J159 Unspecified bacterial pneumonia: Secondary | ICD-10-CM | POA: Diagnosis not present

## 2016-09-03 DIAGNOSIS — J441 Chronic obstructive pulmonary disease with (acute) exacerbation: Secondary | ICD-10-CM | POA: Diagnosis not present

## 2016-09-03 DIAGNOSIS — J9621 Acute and chronic respiratory failure with hypoxia: Secondary | ICD-10-CM | POA: Diagnosis not present

## 2016-09-03 DIAGNOSIS — A419 Sepsis, unspecified organism: Secondary | ICD-10-CM | POA: Diagnosis not present

## 2016-09-03 DIAGNOSIS — J159 Unspecified bacterial pneumonia: Secondary | ICD-10-CM | POA: Diagnosis not present

## 2016-09-03 DIAGNOSIS — E871 Hypo-osmolality and hyponatremia: Secondary | ICD-10-CM | POA: Diagnosis not present

## 2016-09-04 DIAGNOSIS — A419 Sepsis, unspecified organism: Secondary | ICD-10-CM | POA: Diagnosis not present

## 2016-09-04 DIAGNOSIS — J159 Unspecified bacterial pneumonia: Secondary | ICD-10-CM | POA: Diagnosis not present

## 2016-09-04 DIAGNOSIS — J9621 Acute and chronic respiratory failure with hypoxia: Secondary | ICD-10-CM | POA: Diagnosis not present

## 2016-09-04 DIAGNOSIS — J441 Chronic obstructive pulmonary disease with (acute) exacerbation: Secondary | ICD-10-CM | POA: Diagnosis not present

## 2018-07-16 DIAGNOSIS — E039 Hypothyroidism, unspecified: Secondary | ICD-10-CM

## 2018-07-16 DIAGNOSIS — J9621 Acute and chronic respiratory failure with hypoxia: Secondary | ICD-10-CM

## 2018-07-16 DIAGNOSIS — I509 Heart failure, unspecified: Secondary | ICD-10-CM | POA: Diagnosis not present

## 2018-07-16 DIAGNOSIS — I361 Nonrheumatic tricuspid (valve) insufficiency: Secondary | ICD-10-CM

## 2018-07-16 DIAGNOSIS — R0602 Shortness of breath: Secondary | ICD-10-CM

## 2018-07-16 DIAGNOSIS — I517 Cardiomegaly: Secondary | ICD-10-CM | POA: Diagnosis not present

## 2018-07-16 DIAGNOSIS — K219 Gastro-esophageal reflux disease without esophagitis: Secondary | ICD-10-CM

## 2018-07-16 DIAGNOSIS — J441 Chronic obstructive pulmonary disease with (acute) exacerbation: Secondary | ICD-10-CM | POA: Diagnosis not present

## 2018-07-16 DIAGNOSIS — N179 Acute kidney failure, unspecified: Secondary | ICD-10-CM

## 2018-07-16 DIAGNOSIS — E860 Dehydration: Secondary | ICD-10-CM

## 2018-07-17 DIAGNOSIS — I509 Heart failure, unspecified: Secondary | ICD-10-CM

## 2018-07-17 DIAGNOSIS — I471 Supraventricular tachycardia: Secondary | ICD-10-CM | POA: Diagnosis not present

## 2018-07-17 DIAGNOSIS — J441 Chronic obstructive pulmonary disease with (acute) exacerbation: Secondary | ICD-10-CM | POA: Diagnosis not present

## 2018-07-17 DIAGNOSIS — I25810 Atherosclerosis of coronary artery bypass graft(s) without angina pectoris: Secondary | ICD-10-CM

## 2018-07-17 DIAGNOSIS — E039 Hypothyroidism, unspecified: Secondary | ICD-10-CM | POA: Diagnosis not present

## 2018-07-17 DIAGNOSIS — J9621 Acute and chronic respiratory failure with hypoxia: Secondary | ICD-10-CM | POA: Diagnosis not present

## 2018-07-18 DIAGNOSIS — J441 Chronic obstructive pulmonary disease with (acute) exacerbation: Secondary | ICD-10-CM | POA: Diagnosis not present

## 2018-07-18 DIAGNOSIS — I25810 Atherosclerosis of coronary artery bypass graft(s) without angina pectoris: Secondary | ICD-10-CM | POA: Diagnosis not present

## 2018-07-18 DIAGNOSIS — I509 Heart failure, unspecified: Secondary | ICD-10-CM | POA: Diagnosis not present

## 2018-07-18 DIAGNOSIS — E039 Hypothyroidism, unspecified: Secondary | ICD-10-CM | POA: Diagnosis not present

## 2018-07-18 DIAGNOSIS — J9621 Acute and chronic respiratory failure with hypoxia: Secondary | ICD-10-CM | POA: Diagnosis not present

## 2018-07-18 DIAGNOSIS — I471 Supraventricular tachycardia: Secondary | ICD-10-CM | POA: Diagnosis not present

## 2018-07-19 DIAGNOSIS — J441 Chronic obstructive pulmonary disease with (acute) exacerbation: Secondary | ICD-10-CM | POA: Diagnosis not present

## 2018-07-19 DIAGNOSIS — I471 Supraventricular tachycardia: Secondary | ICD-10-CM | POA: Diagnosis not present

## 2018-07-19 DIAGNOSIS — I25810 Atherosclerosis of coronary artery bypass graft(s) without angina pectoris: Secondary | ICD-10-CM | POA: Diagnosis not present

## 2018-07-19 DIAGNOSIS — I509 Heart failure, unspecified: Secondary | ICD-10-CM | POA: Diagnosis not present

## 2018-07-19 DIAGNOSIS — J9621 Acute and chronic respiratory failure with hypoxia: Secondary | ICD-10-CM | POA: Diagnosis not present

## 2018-07-19 DIAGNOSIS — E039 Hypothyroidism, unspecified: Secondary | ICD-10-CM | POA: Diagnosis not present

## 2018-07-20 DIAGNOSIS — J9621 Acute and chronic respiratory failure with hypoxia: Secondary | ICD-10-CM | POA: Diagnosis not present

## 2018-07-20 DIAGNOSIS — J441 Chronic obstructive pulmonary disease with (acute) exacerbation: Secondary | ICD-10-CM | POA: Diagnosis not present

## 2018-07-20 DIAGNOSIS — I471 Supraventricular tachycardia: Secondary | ICD-10-CM

## 2018-07-20 DIAGNOSIS — I509 Heart failure, unspecified: Secondary | ICD-10-CM

## 2018-07-20 DIAGNOSIS — E039 Hypothyroidism, unspecified: Secondary | ICD-10-CM | POA: Diagnosis not present

## 2020-04-18 DIAGNOSIS — J441 Chronic obstructive pulmonary disease with (acute) exacerbation: Secondary | ICD-10-CM | POA: Diagnosis not present

## 2020-04-18 DIAGNOSIS — I471 Supraventricular tachycardia: Secondary | ICD-10-CM | POA: Diagnosis not present

## 2020-04-18 DIAGNOSIS — I361 Nonrheumatic tricuspid (valve) insufficiency: Secondary | ICD-10-CM | POA: Diagnosis not present

## 2020-04-18 DIAGNOSIS — Z954 Presence of other heart-valve replacement: Secondary | ICD-10-CM | POA: Diagnosis not present

## 2020-04-18 DIAGNOSIS — I050 Rheumatic mitral stenosis: Secondary | ICD-10-CM

## 2020-04-18 DIAGNOSIS — I340 Nonrheumatic mitral (valve) insufficiency: Secondary | ICD-10-CM | POA: Diagnosis not present

## 2020-04-18 DIAGNOSIS — I5031 Acute diastolic (congestive) heart failure: Secondary | ICD-10-CM | POA: Diagnosis not present

## 2020-04-18 DIAGNOSIS — J90 Pleural effusion, not elsewhere classified: Secondary | ICD-10-CM

## 2020-04-19 DIAGNOSIS — I471 Supraventricular tachycardia: Secondary | ICD-10-CM | POA: Diagnosis not present

## 2020-04-19 DIAGNOSIS — Z954 Presence of other heart-valve replacement: Secondary | ICD-10-CM | POA: Diagnosis not present

## 2020-04-19 DIAGNOSIS — I342 Nonrheumatic mitral (valve) stenosis: Secondary | ICD-10-CM

## 2020-04-19 DIAGNOSIS — I340 Nonrheumatic mitral (valve) insufficiency: Secondary | ICD-10-CM | POA: Diagnosis not present

## 2020-04-19 DIAGNOSIS — I5031 Acute diastolic (congestive) heart failure: Secondary | ICD-10-CM | POA: Diagnosis not present

## 2020-04-19 DIAGNOSIS — J441 Chronic obstructive pulmonary disease with (acute) exacerbation: Secondary | ICD-10-CM | POA: Diagnosis not present

## 2020-04-20 DIAGNOSIS — J441 Chronic obstructive pulmonary disease with (acute) exacerbation: Secondary | ICD-10-CM | POA: Diagnosis not present

## 2020-04-20 DIAGNOSIS — Z954 Presence of other heart-valve replacement: Secondary | ICD-10-CM | POA: Diagnosis not present

## 2020-04-20 DIAGNOSIS — I471 Supraventricular tachycardia: Secondary | ICD-10-CM | POA: Diagnosis not present

## 2020-04-20 DIAGNOSIS — I5031 Acute diastolic (congestive) heart failure: Secondary | ICD-10-CM | POA: Diagnosis not present

## 2020-04-21 DIAGNOSIS — I5031 Acute diastolic (congestive) heart failure: Secondary | ICD-10-CM | POA: Diagnosis not present

## 2020-04-21 DIAGNOSIS — Z954 Presence of other heart-valve replacement: Secondary | ICD-10-CM | POA: Diagnosis not present

## 2020-04-21 DIAGNOSIS — I471 Supraventricular tachycardia: Secondary | ICD-10-CM | POA: Diagnosis not present

## 2020-04-21 DIAGNOSIS — J441 Chronic obstructive pulmonary disease with (acute) exacerbation: Secondary | ICD-10-CM | POA: Diagnosis not present

## 2021-04-30 DIAGNOSIS — R441 Visual hallucinations: Secondary | ICD-10-CM

## 2021-07-24 DIAGNOSIS — I340 Nonrheumatic mitral (valve) insufficiency: Secondary | ICD-10-CM | POA: Diagnosis not present

## 2021-07-24 DIAGNOSIS — I342 Nonrheumatic mitral (valve) stenosis: Secondary | ICD-10-CM | POA: Diagnosis not present

## 2021-07-24 DIAGNOSIS — I361 Nonrheumatic tricuspid (valve) insufficiency: Secondary | ICD-10-CM | POA: Diagnosis not present

## 2021-07-31 DIAGNOSIS — I493 Ventricular premature depolarization: Secondary | ICD-10-CM | POA: Diagnosis not present

## 2021-11-03 DEATH — deceased
